# Patient Record
Sex: Female | Born: 1935 | Race: White | Hispanic: No | Smoking: Former smoker
Health system: Southern US, Community
[De-identification: ages and names within clinical notes are randomized; demographics above are authoritative.]

## PROBLEM LIST (undated history)

## (undated) DIAGNOSIS — L259 Unspecified contact dermatitis, unspecified cause: Secondary | ICD-10-CM

## (undated) DIAGNOSIS — E785 Hyperlipidemia, unspecified: Secondary | ICD-10-CM

## (undated) DIAGNOSIS — R001 Bradycardia, unspecified: Secondary | ICD-10-CM

## (undated) DIAGNOSIS — D689 Coagulation defect, unspecified: Secondary | ICD-10-CM

## (undated) DIAGNOSIS — K5730 Diverticulosis of large intestine without perforation or abscess without bleeding: Secondary | ICD-10-CM

## (undated) DIAGNOSIS — M545 Low back pain, unspecified: Secondary | ICD-10-CM

## (undated) DIAGNOSIS — Z8673 Personal history of transient ischemic attack (TIA), and cerebral infarction without residual deficits: Secondary | ICD-10-CM

## (undated) DIAGNOSIS — E039 Hypothyroidism, unspecified: Secondary | ICD-10-CM

## (undated) DIAGNOSIS — L509 Urticaria, unspecified: Secondary | ICD-10-CM

## (undated) DIAGNOSIS — Z85820 Personal history of malignant melanoma of skin: Secondary | ICD-10-CM

## (undated) DIAGNOSIS — M1990 Unspecified osteoarthritis, unspecified site: Secondary | ICD-10-CM

## (undated) DIAGNOSIS — N390 Urinary tract infection, site not specified: Secondary | ICD-10-CM

## (undated) DIAGNOSIS — I350 Nonrheumatic aortic (valve) stenosis: Secondary | ICD-10-CM

## (undated) DIAGNOSIS — I6529 Occlusion and stenosis of unspecified carotid artery: Secondary | ICD-10-CM

## (undated) DIAGNOSIS — C4490 Unspecified malignant neoplasm of skin, unspecified: Secondary | ICD-10-CM

## (undated) DIAGNOSIS — I482 Chronic atrial fibrillation, unspecified: Secondary | ICD-10-CM

## (undated) DIAGNOSIS — I10 Essential (primary) hypertension: Secondary | ICD-10-CM

## (undated) DIAGNOSIS — I5032 Chronic diastolic (congestive) heart failure: Secondary | ICD-10-CM

## (undated) DIAGNOSIS — M109 Gout, unspecified: Secondary | ICD-10-CM

## (undated) HISTORY — DX: Diverticulosis of large intestine without perforation or abscess without bleeding: K57.30

## (undated) HISTORY — DX: Hyperlipidemia, unspecified: E78.5

## (undated) HISTORY — DX: Low back pain, unspecified: M54.50

## (undated) HISTORY — DX: Unspecified malignant neoplasm of skin, unspecified: C44.90

## (undated) HISTORY — PX: TONSILLECTOMY: SUR1361

## (undated) HISTORY — DX: Urticaria, unspecified: L50.9

## (undated) HISTORY — DX: Personal history of transient ischemic attack (TIA), and cerebral infarction without residual deficits: Z86.73

## (undated) HISTORY — DX: Essential (primary) hypertension: I10

## (undated) HISTORY — DX: Nonrheumatic aortic (valve) stenosis: I35.0

## (undated) HISTORY — DX: Unspecified osteoarthritis, unspecified site: M19.90

## (undated) HISTORY — DX: Gout, unspecified: M10.9

## (undated) HISTORY — DX: Unspecified contact dermatitis, unspecified cause: L25.9

## (undated) HISTORY — DX: Chronic atrial fibrillation, unspecified: I48.20

## (undated) HISTORY — DX: Low back pain: M54.5

## (undated) HISTORY — DX: Urinary tract infection, site not specified: N39.0

## (undated) HISTORY — DX: Hypothyroidism, unspecified: E03.9

## (undated) HISTORY — DX: Personal history of malignant melanoma of skin: Z85.820

## (undated) HISTORY — PX: BAND HEMORRHOIDECTOMY: SHX1213

## (undated) HISTORY — DX: Coagulation defect, unspecified: D68.9

## (undated) HISTORY — DX: Chronic diastolic (congestive) heart failure: I50.32

## (undated) HISTORY — DX: Bradycardia, unspecified: R00.1

## (undated) HISTORY — PX: ABDOMINAL HYSTERECTOMY: SHX81

## (undated) HISTORY — DX: Occlusion and stenosis of unspecified carotid artery: I65.29

---

## 1999-10-06 HISTORY — PX: SKIN CANCER EXCISION: SHX779

## 2003-10-11 ENCOUNTER — Encounter: Admission: RE | Admit: 2003-10-11 | Discharge: 2003-10-11 | Payer: Self-pay | Admitting: Family Medicine

## 2004-10-31 ENCOUNTER — Ambulatory Visit: Payer: Self-pay | Admitting: Family Medicine

## 2004-11-11 ENCOUNTER — Ambulatory Visit: Payer: Self-pay | Admitting: Gastroenterology

## 2004-11-17 ENCOUNTER — Ambulatory Visit: Payer: Self-pay | Admitting: Gastroenterology

## 2004-12-10 ENCOUNTER — Ambulatory Visit: Payer: Self-pay | Admitting: Gastroenterology

## 2005-02-12 ENCOUNTER — Ambulatory Visit: Payer: Self-pay | Admitting: Family Medicine

## 2005-04-09 ENCOUNTER — Ambulatory Visit: Payer: Self-pay | Admitting: Gastroenterology

## 2005-04-14 ENCOUNTER — Ambulatory Visit: Payer: Self-pay | Admitting: Gastroenterology

## 2005-04-14 ENCOUNTER — Ambulatory Visit (HOSPITAL_COMMUNITY): Admission: RE | Admit: 2005-04-14 | Discharge: 2005-04-14 | Payer: Self-pay | Admitting: Gastroenterology

## 2005-05-20 ENCOUNTER — Ambulatory Visit: Payer: Self-pay | Admitting: Family Medicine

## 2005-05-25 ENCOUNTER — Ambulatory Visit: Payer: Self-pay | Admitting: Gastroenterology

## 2005-05-29 ENCOUNTER — Ambulatory Visit: Payer: Self-pay | Admitting: Family Medicine

## 2005-10-27 ENCOUNTER — Ambulatory Visit: Payer: Self-pay | Admitting: Family Medicine

## 2005-11-06 ENCOUNTER — Ambulatory Visit: Payer: Self-pay | Admitting: Internal Medicine

## 2005-11-10 ENCOUNTER — Ambulatory Visit: Payer: Self-pay | Admitting: Family Medicine

## 2005-11-25 ENCOUNTER — Ambulatory Visit: Payer: Self-pay | Admitting: Family Medicine

## 2006-07-29 ENCOUNTER — Ambulatory Visit: Payer: Self-pay | Admitting: Internal Medicine

## 2006-10-01 ENCOUNTER — Ambulatory Visit: Payer: Self-pay | Admitting: Family Medicine

## 2006-10-05 DIAGNOSIS — Z8673 Personal history of transient ischemic attack (TIA), and cerebral infarction without residual deficits: Secondary | ICD-10-CM

## 2006-10-05 HISTORY — DX: Personal history of transient ischemic attack (TIA), and cerebral infarction without residual deficits: Z86.73

## 2006-11-29 ENCOUNTER — Ambulatory Visit: Payer: Self-pay | Admitting: Gastroenterology

## 2006-12-21 ENCOUNTER — Ambulatory Visit (HOSPITAL_COMMUNITY): Admission: RE | Admit: 2006-12-21 | Discharge: 2006-12-21 | Payer: Self-pay | Admitting: Gastroenterology

## 2007-01-03 ENCOUNTER — Ambulatory Visit: Payer: Self-pay | Admitting: Family Medicine

## 2007-01-03 LAB — CONVERTED CEMR LAB
ALT: 16 units/L (ref 0–40)
AST: 23 units/L (ref 0–37)
Albumin: 3.9 g/dL (ref 3.5–5.2)
Alkaline Phosphatase: 94 units/L (ref 39–117)
BUN: 13 mg/dL (ref 6–23)
Basophils Absolute: 0 10*3/uL (ref 0.0–0.1)
Basophils Relative: 0.6 % (ref 0.0–1.0)
Bilirubin, Direct: 0.1 mg/dL (ref 0.0–0.3)
CO2: 31 meq/L (ref 19–32)
Calcium: 9.9 mg/dL (ref 8.4–10.5)
Chloride: 105 meq/L (ref 96–112)
Cholesterol: 153 mg/dL (ref 0–200)
Creatinine, Ser: 0.9 mg/dL (ref 0.4–1.2)
Eosinophils Absolute: 0.2 10*3/uL (ref 0.0–0.6)
Eosinophils Relative: 2.7 % (ref 0.0–5.0)
GFR calc Af Amer: 78 mL/min
GFR calc non Af Amer: 64 mL/min
Glucose, Bld: 90 mg/dL (ref 70–99)
HCT: 42.3 % (ref 36.0–46.0)
HDL: 49.1 mg/dL (ref >39.0)
Hemoglobin: 14.1 g/dL (ref 12.0–15.0)
LDL Cholesterol: 82 mg/dL (ref 0–99)
Lymphocytes Relative: 27.9 % (ref 12.0–46.0)
MCHC: 33.4 g/dL (ref 30.0–36.0)
MCV: 98.6 fL (ref 78.0–100.0)
Monocytes Absolute: 0.8 10*3/uL — ABNORMAL HIGH (ref 0.2–0.7)
Monocytes Relative: 10 % (ref 3.0–11.0)
Neutro Abs: 4.6 10*3/uL (ref 1.4–7.7)
Neutrophils Relative %: 58.8 % (ref 43.0–77.0)
Platelets: 286 10*3/uL (ref 150–400)
Potassium: 4.1 meq/L (ref 3.5–5.1)
RBC: 4.29 M/uL (ref 3.87–5.11)
RDW: 14.2 % (ref 11.5–14.6)
Sodium: 142 meq/L (ref 135–145)
TSH: 3.4 microintl units/mL (ref 0.35–5.50)
Total Bilirubin: 0.8 mg/dL (ref 0.3–1.2)
Total CHOL/HDL Ratio: 3.1
Total Protein: 7.4 g/dL (ref 6.0–8.3)
Triglycerides: 109 mg/dL (ref 0–149)
VLDL: 22 mg/dL (ref 0–40)
WBC: 7.8 10*3/uL (ref 4.5–10.5)

## 2007-01-20 ENCOUNTER — Ambulatory Visit: Payer: Self-pay | Admitting: Gastroenterology

## 2007-03-27 ENCOUNTER — Inpatient Hospital Stay (HOSPITAL_COMMUNITY): Admission: EM | Admit: 2007-03-27 | Discharge: 2007-03-30 | Payer: Self-pay | Admitting: Emergency Medicine

## 2007-03-28 ENCOUNTER — Encounter (INDEPENDENT_AMBULATORY_CARE_PROVIDER_SITE_OTHER): Payer: Self-pay | Admitting: *Deleted

## 2007-03-28 ENCOUNTER — Ambulatory Visit: Payer: Self-pay | Admitting: Cardiology

## 2007-03-28 ENCOUNTER — Encounter (INDEPENDENT_AMBULATORY_CARE_PROVIDER_SITE_OTHER): Payer: Self-pay | Admitting: Pediatrics

## 2007-04-11 ENCOUNTER — Ambulatory Visit: Payer: Self-pay | Admitting: Family Medicine

## 2007-04-22 ENCOUNTER — Ambulatory Visit: Payer: Self-pay | Admitting: Cardiology

## 2007-04-27 ENCOUNTER — Ambulatory Visit: Payer: Self-pay | Admitting: Cardiology

## 2007-05-03 ENCOUNTER — Ambulatory Visit: Payer: Self-pay | Admitting: Cardiology

## 2007-05-04 ENCOUNTER — Ambulatory Visit: Payer: Self-pay

## 2007-05-04 ENCOUNTER — Ambulatory Visit: Payer: Self-pay | Admitting: Cardiology

## 2007-05-06 ENCOUNTER — Ambulatory Visit: Payer: Self-pay | Admitting: Internal Medicine

## 2007-05-06 LAB — CONVERTED CEMR LAB
INR: 1.2 (ref 0.9–2.0)
Prothrombin Time: 13.5 s (ref 10.0–14.0)

## 2007-05-08 ENCOUNTER — Emergency Department (HOSPITAL_COMMUNITY): Admission: EM | Admit: 2007-05-08 | Discharge: 2007-05-08 | Payer: Self-pay | Admitting: Emergency Medicine

## 2007-05-09 ENCOUNTER — Ambulatory Visit: Payer: Self-pay

## 2007-05-09 LAB — CONVERTED CEMR LAB
Basophils Absolute: 0 10*3/uL (ref 0.0–0.1)
Basophils Relative: 0.5 % (ref 0.0–1.0)
Eosinophils Absolute: 0.1 10*3/uL (ref 0.0–0.6)
Eosinophils Relative: 1.5 % (ref 0.0–5.0)
HCT: 29.6 % — ABNORMAL LOW (ref 36.0–46.0)
Hemoglobin: 10 g/dL — ABNORMAL LOW (ref 12.0–15.0)
INR: 2 (ref 0.9–2.0)
Lymphocytes Relative: 40 % (ref 12.0–46.0)
MCHC: 33.8 g/dL (ref 30.0–36.0)
MCV: 100.2 fL — ABNORMAL HIGH (ref 78.0–100.0)
Monocytes Absolute: 0.7 10*3/uL (ref 0.2–0.7)
Monocytes Relative: 11.1 % — ABNORMAL HIGH (ref 3.0–11.0)
Neutro Abs: 3 10*3/uL (ref 1.4–7.7)
Neutrophils Relative %: 46.9 % (ref 43.0–77.0)
Platelets: 241 10*3/uL (ref 150–400)
Prothrombin Time: 17.6 s — ABNORMAL HIGH (ref 10.0–14.0)
RBC: 2.95 M/uL — ABNORMAL LOW (ref 3.87–5.11)
RDW: 16 % — ABNORMAL HIGH (ref 11.5–14.6)
WBC: 6.4 10*3/uL (ref 4.5–10.5)

## 2007-05-11 ENCOUNTER — Ambulatory Visit: Payer: Self-pay | Admitting: Cardiology

## 2007-05-16 ENCOUNTER — Ambulatory Visit: Payer: Self-pay | Admitting: Cardiology

## 2007-05-18 ENCOUNTER — Ambulatory Visit: Payer: Self-pay | Admitting: Internal Medicine

## 2007-05-23 ENCOUNTER — Ambulatory Visit: Payer: Self-pay | Admitting: Cardiology

## 2007-06-08 ENCOUNTER — Ambulatory Visit: Payer: Self-pay | Admitting: Internal Medicine

## 2007-06-10 ENCOUNTER — Emergency Department (HOSPITAL_COMMUNITY): Admission: EM | Admit: 2007-06-10 | Discharge: 2007-06-10 | Payer: Self-pay | Admitting: Emergency Medicine

## 2007-06-16 ENCOUNTER — Ambulatory Visit: Payer: Self-pay | Admitting: Cardiology

## 2007-06-16 LAB — CONVERTED CEMR LAB: TSH: 1.84 microintl units/mL (ref 0.35–5.50)

## 2007-07-07 ENCOUNTER — Ambulatory Visit: Payer: Self-pay | Admitting: Cardiology

## 2007-07-15 ENCOUNTER — Ambulatory Visit: Payer: Self-pay | Admitting: Family Medicine

## 2007-07-28 ENCOUNTER — Ambulatory Visit: Payer: Self-pay | Admitting: Cardiology

## 2007-08-01 ENCOUNTER — Ambulatory Visit: Payer: Self-pay | Admitting: Cardiology

## 2007-08-01 LAB — CONVERTED CEMR LAB
ALT: 19 units/L (ref 0–35)
AST: 29 units/L (ref 0–37)
Albumin: 3.7 g/dL (ref 3.5–5.2)
Alkaline Phosphatase: 72 units/L (ref 39–117)
Bilirubin, Direct: 0.1 mg/dL (ref 0.0–0.3)
Cholesterol: 136 mg/dL (ref 0–200)
HDL: 41 mg/dL (ref >39.0)
LDL Cholesterol: 75 mg/dL (ref 0–99)
Total Bilirubin: 0.8 mg/dL (ref 0.3–1.2)
Total CHOL/HDL Ratio: 3.3
Total Protein: 7.3 g/dL (ref 6.0–8.3)
Triglycerides: 102 mg/dL (ref 0–149)
VLDL: 20 mg/dL (ref 0–40)

## 2007-08-09 ENCOUNTER — Encounter: Payer: Self-pay | Admitting: Family Medicine

## 2007-08-25 ENCOUNTER — Ambulatory Visit: Payer: Self-pay | Admitting: Cardiology

## 2007-09-22 ENCOUNTER — Ambulatory Visit: Payer: Self-pay | Admitting: Cardiology

## 2007-10-03 ENCOUNTER — Telehealth: Payer: Self-pay | Admitting: Family Medicine

## 2007-10-11 ENCOUNTER — Ambulatory Visit: Payer: Self-pay | Admitting: Cardiology

## 2007-10-18 ENCOUNTER — Ambulatory Visit: Payer: Self-pay | Admitting: Family Medicine

## 2007-10-18 DIAGNOSIS — M1990 Unspecified osteoarthritis, unspecified site: Secondary | ICD-10-CM

## 2007-10-18 DIAGNOSIS — I4821 Permanent atrial fibrillation: Secondary | ICD-10-CM

## 2007-10-18 DIAGNOSIS — M109 Gout, unspecified: Secondary | ICD-10-CM

## 2007-10-18 DIAGNOSIS — E039 Hypothyroidism, unspecified: Secondary | ICD-10-CM | POA: Insufficient documentation

## 2007-10-18 DIAGNOSIS — I10 Essential (primary) hypertension: Secondary | ICD-10-CM

## 2007-10-18 DIAGNOSIS — K5730 Diverticulosis of large intestine without perforation or abscess without bleeding: Secondary | ICD-10-CM | POA: Insufficient documentation

## 2007-10-18 DIAGNOSIS — Z8679 Personal history of other diseases of the circulatory system: Secondary | ICD-10-CM | POA: Insufficient documentation

## 2007-10-18 DIAGNOSIS — E785 Hyperlipidemia, unspecified: Secondary | ICD-10-CM

## 2007-11-01 ENCOUNTER — Ambulatory Visit: Payer: Self-pay | Admitting: Cardiovascular Disease

## 2007-11-22 ENCOUNTER — Ambulatory Visit: Payer: Self-pay | Admitting: Cardiology

## 2007-12-01 ENCOUNTER — Encounter: Payer: Self-pay | Admitting: Family Medicine

## 2007-12-01 ENCOUNTER — Ambulatory Visit: Payer: Self-pay | Admitting: Internal Medicine

## 2007-12-06 ENCOUNTER — Ambulatory Visit: Payer: Self-pay | Admitting: Internal Medicine

## 2007-12-20 ENCOUNTER — Ambulatory Visit: Payer: Self-pay | Admitting: Cardiology

## 2007-12-30 ENCOUNTER — Telehealth: Payer: Self-pay | Admitting: Family Medicine

## 2008-01-09 ENCOUNTER — Ambulatory Visit: Payer: Self-pay | Admitting: Family Medicine

## 2008-01-09 DIAGNOSIS — M79609 Pain in unspecified limb: Secondary | ICD-10-CM

## 2008-01-10 ENCOUNTER — Ambulatory Visit: Payer: Self-pay | Admitting: Cardiology

## 2008-01-12 ENCOUNTER — Telehealth: Payer: Self-pay | Admitting: Family Medicine

## 2008-01-13 ENCOUNTER — Encounter: Admission: RE | Admit: 2008-01-13 | Discharge: 2008-01-13 | Payer: Self-pay | Admitting: Family Medicine

## 2008-01-16 ENCOUNTER — Telehealth: Payer: Self-pay | Admitting: Family Medicine

## 2008-01-16 ENCOUNTER — Ambulatory Visit: Payer: Self-pay | Admitting: Internal Medicine

## 2008-01-16 DIAGNOSIS — IMO0002 Reserved for concepts with insufficient information to code with codable children: Secondary | ICD-10-CM | POA: Insufficient documentation

## 2008-01-16 DIAGNOSIS — R21 Rash and other nonspecific skin eruption: Secondary | ICD-10-CM

## 2008-01-17 ENCOUNTER — Encounter: Payer: Self-pay | Admitting: Internal Medicine

## 2008-01-20 ENCOUNTER — Ambulatory Visit: Payer: Self-pay | Admitting: Family Medicine

## 2008-01-24 ENCOUNTER — Telehealth: Payer: Self-pay | Admitting: Family Medicine

## 2008-01-30 ENCOUNTER — Ambulatory Visit: Payer: Self-pay | Admitting: Family Medicine

## 2008-01-30 ENCOUNTER — Ambulatory Visit: Payer: Self-pay

## 2008-01-30 DIAGNOSIS — R609 Edema, unspecified: Secondary | ICD-10-CM

## 2008-01-31 DIAGNOSIS — M545 Low back pain: Secondary | ICD-10-CM

## 2008-02-02 ENCOUNTER — Encounter: Payer: Self-pay | Admitting: Family Medicine

## 2008-02-07 ENCOUNTER — Ambulatory Visit: Payer: Self-pay | Admitting: Cardiology

## 2008-02-13 ENCOUNTER — Encounter: Payer: Self-pay | Admitting: Family Medicine

## 2008-02-17 ENCOUNTER — Ambulatory Visit: Payer: Self-pay | Admitting: Cardiology

## 2008-02-20 ENCOUNTER — Ambulatory Visit: Payer: Self-pay | Admitting: Family Medicine

## 2008-02-20 LAB — CONVERTED CEMR LAB
Bilirubin Urine: NEGATIVE
Blood in Urine, dipstick: NEGATIVE
Glucose, Urine, Semiquant: NEGATIVE
Ketones, urine, test strip: NEGATIVE
Nitrite: NEGATIVE
Protein, U semiquant: NEGATIVE
Specific Gravity, Urine: <1.005
Urobilinogen, UA: 0.2
pH: 7

## 2008-02-21 ENCOUNTER — Ambulatory Visit: Payer: Self-pay | Admitting: Cardiology

## 2008-02-22 ENCOUNTER — Telehealth: Payer: Self-pay | Admitting: Family Medicine

## 2008-02-22 LAB — CONVERTED CEMR LAB
ALT: 25 units/L (ref 0–35)
AST: 38 units/L — ABNORMAL HIGH (ref 0–37)
Albumin: 3.7 g/dL (ref 3.5–5.2)
Alkaline Phosphatase: 72 units/L (ref 39–117)
BUN: 10 mg/dL (ref 6–23)
Basophils Absolute: 0.1 10*3/uL (ref 0.0–0.1)
Basophils Relative: 1.2 % — ABNORMAL HIGH (ref 0.0–1.0)
Bilirubin, Direct: 0.1 mg/dL (ref 0.0–0.3)
CO2: 28 meq/L (ref 19–32)
Calcium: 9.8 mg/dL (ref 8.4–10.5)
Chloride: 103 meq/L (ref 96–112)
Creatinine, Ser: 0.9 mg/dL (ref 0.4–1.2)
Eosinophils Absolute: 0.1 10*3/uL (ref 0.0–0.7)
Eosinophils Relative: 1.8 % (ref 0.0–5.0)
GFR calc Af Amer: 78 mL/min
GFR calc non Af Amer: 64 mL/min
Glucose, Bld: 69 mg/dL — ABNORMAL LOW (ref 70–99)
HCT: 36.3 % (ref 36.0–46.0)
Hemoglobin: 12 g/dL (ref 12.0–15.0)
Lymphocytes Relative: 32 % (ref 12.0–46.0)
MCHC: 33.1 g/dL (ref 30.0–36.0)
MCV: 94.8 fL (ref 78.0–100.0)
Monocytes Absolute: 0.5 10*3/uL (ref 0.1–1.0)
Monocytes Relative: 8.1 % (ref 3.0–12.0)
Neutro Abs: 3.9 10*3/uL (ref 1.4–7.7)
Neutrophils Relative %: 56.9 % (ref 43.0–77.0)
Platelets: 299 10*3/uL (ref 150–400)
Potassium: 3.7 meq/L (ref 3.5–5.1)
RBC: 3.83 M/uL — ABNORMAL LOW (ref 3.87–5.11)
RDW: 16.7 % — ABNORMAL HIGH (ref 11.5–14.6)
Sodium: 137 meq/L (ref 135–145)
TSH: 1.63 microintl units/mL (ref 0.35–5.50)
Total Bilirubin: 1.1 mg/dL (ref 0.3–1.2)
Total Protein: 7.4 g/dL (ref 6.0–8.3)
WBC: 6.7 10*3/uL (ref 4.5–10.5)

## 2008-03-01 ENCOUNTER — Ambulatory Visit: Payer: Self-pay | Admitting: Family Medicine

## 2008-03-01 DIAGNOSIS — L509 Urticaria, unspecified: Secondary | ICD-10-CM

## 2008-03-05 ENCOUNTER — Ambulatory Visit: Payer: Self-pay | Admitting: Cardiovascular Disease

## 2008-03-08 ENCOUNTER — Telehealth: Payer: Self-pay | Admitting: Family Medicine

## 2008-03-19 ENCOUNTER — Ambulatory Visit: Payer: Self-pay | Admitting: Internal Medicine

## 2008-04-02 ENCOUNTER — Ambulatory Visit: Payer: Self-pay | Admitting: Cardiology

## 2008-04-26 ENCOUNTER — Ambulatory Visit: Payer: Self-pay | Admitting: Internal Medicine

## 2008-04-26 ENCOUNTER — Ambulatory Visit: Payer: Self-pay | Admitting: Cardiology

## 2008-05-24 ENCOUNTER — Ambulatory Visit: Payer: Self-pay | Admitting: Internal Medicine

## 2008-05-28 ENCOUNTER — Telehealth: Payer: Self-pay | Admitting: Family Medicine

## 2008-06-01 ENCOUNTER — Ambulatory Visit: Payer: Self-pay | Admitting: Cardiology

## 2008-06-13 ENCOUNTER — Ambulatory Visit: Payer: Self-pay | Admitting: Family Medicine

## 2008-06-13 DIAGNOSIS — N3000 Acute cystitis without hematuria: Secondary | ICD-10-CM | POA: Insufficient documentation

## 2008-06-13 LAB — CONVERTED CEMR LAB
Bilirubin Urine: NEGATIVE
Blood in Urine, dipstick: NEGATIVE
Glucose, Urine, Semiquant: NEGATIVE
Ketones, urine, test strip: NEGATIVE
Nitrite: NEGATIVE
Protein, U semiquant: NEGATIVE
Specific Gravity, Urine: 1.02
Urobilinogen, UA: 0.2
pH: 7

## 2008-06-20 ENCOUNTER — Ambulatory Visit: Payer: Self-pay | Admitting: Cardiovascular Disease

## 2008-07-17 ENCOUNTER — Ambulatory Visit: Payer: Self-pay | Admitting: Internal Medicine

## 2008-07-18 ENCOUNTER — Ambulatory Visit: Payer: Self-pay | Admitting: Family Medicine

## 2008-08-09 ENCOUNTER — Ambulatory Visit: Payer: Self-pay | Admitting: Cardiology

## 2008-08-11 ENCOUNTER — Encounter: Payer: Self-pay | Admitting: Family Medicine

## 2008-08-28 ENCOUNTER — Ambulatory Visit: Payer: Self-pay | Admitting: Family Medicine

## 2008-08-28 DIAGNOSIS — N390 Urinary tract infection, site not specified: Secondary | ICD-10-CM

## 2008-08-28 LAB — CONVERTED CEMR LAB
Bilirubin Urine: NEGATIVE
Glucose, Urine, Semiquant: NEGATIVE
Ketones, urine, test strip: NEGATIVE
Nitrite: NEGATIVE
Protein, U semiquant: NEGATIVE
Specific Gravity, Urine: 1.015
Urobilinogen, UA: 0.2
pH: 7

## 2008-08-29 ENCOUNTER — Ambulatory Visit: Payer: Self-pay | Admitting: Cardiovascular Disease

## 2008-08-29 ENCOUNTER — Encounter: Payer: Self-pay | Admitting: Family Medicine

## 2008-08-31 LAB — CONVERTED CEMR LAB
ALT: 17 units/L (ref 0–35)
AST: 32 units/L (ref 0–37)
Albumin: 3.2 g/dL — ABNORMAL LOW (ref 3.5–5.2)
Alkaline Phosphatase: 80 units/L (ref 39–117)
BUN: 9 mg/dL (ref 6–23)
Basophils Absolute: 0.1 10*3/uL (ref 0.0–0.1)
Basophils Relative: 0.9 % (ref 0.0–3.0)
Bilirubin, Direct: 0.1 mg/dL (ref 0.0–0.3)
CO2: 31 meq/L (ref 19–32)
Calcium: 9.7 mg/dL (ref 8.4–10.5)
Chloride: 104 meq/L (ref 96–112)
Creatinine, Ser: 0.8 mg/dL (ref 0.4–1.2)
Eosinophils Absolute: 0.3 10*3/uL (ref 0.0–0.7)
Eosinophils Relative: 3.7 % (ref 0.0–5.0)
GFR calc Af Amer: 89 mL/min
GFR calc non Af Amer: 73 mL/min
Glucose, Bld: 84 mg/dL (ref 70–99)
HCT: 34.4 % — ABNORMAL LOW (ref 36.0–46.0)
Hemoglobin: 11.8 g/dL — ABNORMAL LOW (ref 12.0–15.0)
Lymphocytes Relative: 31 % (ref 12.0–46.0)
MCHC: 34.4 g/dL (ref 30.0–36.0)
MCV: 97.2 fL (ref 78.0–100.0)
Monocytes Absolute: 0.6 10*3/uL (ref 0.1–1.0)
Monocytes Relative: 9.1 % (ref 3.0–12.0)
Neutro Abs: 3.7 10*3/uL (ref 1.4–7.7)
Neutrophils Relative %: 55.3 % (ref 43.0–77.0)
Platelets: 263 10*3/uL (ref 150–400)
Potassium: 3.5 meq/L (ref 3.5–5.1)
RBC: 3.54 M/uL — ABNORMAL LOW (ref 3.87–5.11)
RDW: 13.1 % (ref 11.5–14.6)
Sodium: 141 meq/L (ref 135–145)
TSH: 1.2 microintl units/mL (ref 0.35–5.50)
Total Bilirubin: 0.6 mg/dL (ref 0.3–1.2)
Total Protein: 7.3 g/dL (ref 6.0–8.3)
WBC: 6.8 10*3/uL (ref 4.5–10.5)

## 2008-09-04 ENCOUNTER — Telehealth: Payer: Self-pay | Admitting: Family Medicine

## 2008-09-07 ENCOUNTER — Ambulatory Visit: Payer: Self-pay | Admitting: Internal Medicine

## 2008-09-14 ENCOUNTER — Ambulatory Visit: Payer: Self-pay | Admitting: Family Medicine

## 2008-09-18 ENCOUNTER — Encounter: Payer: Self-pay | Admitting: Family Medicine

## 2008-09-21 ENCOUNTER — Ambulatory Visit: Payer: Self-pay | Admitting: Cardiovascular Disease

## 2008-10-02 ENCOUNTER — Ambulatory Visit: Payer: Self-pay | Admitting: Cardiovascular Disease

## 2008-10-09 ENCOUNTER — Ambulatory Visit: Payer: Self-pay | Admitting: Family Medicine

## 2008-10-16 ENCOUNTER — Ambulatory Visit: Payer: Self-pay | Admitting: Cardiovascular Disease

## 2008-10-29 ENCOUNTER — Ambulatory Visit: Payer: Self-pay | Admitting: Family Medicine

## 2008-10-29 DIAGNOSIS — L259 Unspecified contact dermatitis, unspecified cause: Secondary | ICD-10-CM

## 2008-10-29 DIAGNOSIS — J069 Acute upper respiratory infection, unspecified: Secondary | ICD-10-CM | POA: Insufficient documentation

## 2008-11-01 ENCOUNTER — Telehealth: Payer: Self-pay | Admitting: Family Medicine

## 2008-11-06 ENCOUNTER — Ambulatory Visit: Payer: Self-pay | Admitting: Cardiology

## 2008-11-13 ENCOUNTER — Ambulatory Visit: Payer: Self-pay | Admitting: Cardiology

## 2008-12-04 ENCOUNTER — Ambulatory Visit: Payer: Self-pay | Admitting: Internal Medicine

## 2009-01-01 ENCOUNTER — Ambulatory Visit: Payer: Self-pay | Admitting: Cardiology

## 2009-01-29 ENCOUNTER — Ambulatory Visit: Payer: Self-pay | Admitting: Cardiology

## 2009-02-12 ENCOUNTER — Ambulatory Visit: Payer: Self-pay | Admitting: Cardiology

## 2009-02-26 ENCOUNTER — Ambulatory Visit: Payer: Self-pay | Admitting: Cardiology

## 2009-03-05 ENCOUNTER — Telehealth: Payer: Self-pay | Admitting: Family Medicine

## 2009-03-05 ENCOUNTER — Encounter: Payer: Self-pay | Admitting: *Deleted

## 2009-03-19 ENCOUNTER — Encounter (INDEPENDENT_AMBULATORY_CARE_PROVIDER_SITE_OTHER): Payer: Self-pay | Admitting: Cardiology

## 2009-03-19 ENCOUNTER — Ambulatory Visit: Payer: Self-pay | Admitting: Internal Medicine

## 2009-03-19 LAB — CONVERTED CEMR LAB
POC INR: 2.3
Protime: 18.4

## 2009-03-22 ENCOUNTER — Ambulatory Visit: Payer: Self-pay | Admitting: Family Medicine

## 2009-04-10 ENCOUNTER — Encounter: Payer: Self-pay | Admitting: *Deleted

## 2009-04-16 ENCOUNTER — Ambulatory Visit: Payer: Self-pay | Admitting: Cardiology

## 2009-04-16 LAB — CONVERTED CEMR LAB
POC INR: 2.1
Prothrombin Time: 18.1 s

## 2009-04-25 ENCOUNTER — Encounter (INDEPENDENT_AMBULATORY_CARE_PROVIDER_SITE_OTHER): Payer: Self-pay | Admitting: *Deleted

## 2009-05-14 ENCOUNTER — Ambulatory Visit: Payer: Self-pay | Admitting: Internal Medicine

## 2009-05-14 LAB — CONVERTED CEMR LAB
POC INR: 2.1
Prothrombin Time: 17.9 s

## 2009-05-21 ENCOUNTER — Ambulatory Visit: Payer: Self-pay | Admitting: Family Medicine

## 2009-05-21 ENCOUNTER — Encounter: Payer: Self-pay | Admitting: Cardiology

## 2009-05-28 ENCOUNTER — Ambulatory Visit: Payer: Self-pay | Admitting: Cardiology

## 2009-05-28 DIAGNOSIS — I498 Other specified cardiac arrhythmias: Secondary | ICD-10-CM

## 2009-05-28 DIAGNOSIS — I6340 Cerebral infarction due to embolism of unspecified cerebral artery: Secondary | ICD-10-CM

## 2009-05-31 ENCOUNTER — Ambulatory Visit: Payer: Self-pay | Admitting: Cardiology

## 2009-05-31 ENCOUNTER — Encounter: Payer: Self-pay | Admitting: Cardiovascular Disease

## 2009-06-11 ENCOUNTER — Ambulatory Visit: Payer: Self-pay | Admitting: Cardiology

## 2009-06-11 LAB — CONVERTED CEMR LAB: POC INR: 3.1

## 2009-07-01 ENCOUNTER — Telehealth: Payer: Self-pay | Admitting: Cardiology

## 2009-07-08 ENCOUNTER — Ambulatory Visit: Payer: Self-pay | Admitting: Internal Medicine

## 2009-07-08 ENCOUNTER — Ambulatory Visit: Payer: Self-pay | Admitting: Cardiology

## 2009-07-08 LAB — CONVERTED CEMR LAB: POC INR: 2.9

## 2009-07-11 ENCOUNTER — Ambulatory Visit: Payer: Self-pay | Admitting: Family Medicine

## 2009-07-16 LAB — CONVERTED CEMR LAB
BUN: 14 mg/dL (ref 6–23)
CO2: 32 meq/L (ref 19–32)
Calcium: 9.7 mg/dL (ref 8.4–10.5)
Chloride: 101 meq/L (ref 96–112)
Creatinine, Ser: 0.8 mg/dL (ref 0.4–1.2)
GFR calc non Af Amer: 73.14 mL/min (ref >60)
Glucose, Bld: 86 mg/dL (ref 70–99)
Potassium: 3.5 meq/L (ref 3.5–5.1)
Sodium: 136 meq/L (ref 135–145)

## 2009-08-06 ENCOUNTER — Ambulatory Visit: Payer: Self-pay | Admitting: Internal Medicine

## 2009-08-06 LAB — CONVERTED CEMR LAB: POC INR: 2.7

## 2009-08-16 ENCOUNTER — Encounter (INDEPENDENT_AMBULATORY_CARE_PROVIDER_SITE_OTHER): Payer: Self-pay | Admitting: *Deleted

## 2009-09-03 ENCOUNTER — Ambulatory Visit: Payer: Self-pay | Admitting: Cardiology

## 2009-09-03 LAB — CONVERTED CEMR LAB: POC INR: 2.6

## 2009-09-11 ENCOUNTER — Encounter (INDEPENDENT_AMBULATORY_CARE_PROVIDER_SITE_OTHER): Payer: Self-pay | Admitting: *Deleted

## 2009-10-01 ENCOUNTER — Ambulatory Visit: Payer: Self-pay | Admitting: Cardiology

## 2009-10-01 ENCOUNTER — Encounter (INDEPENDENT_AMBULATORY_CARE_PROVIDER_SITE_OTHER): Payer: Self-pay | Admitting: *Deleted

## 2009-10-01 LAB — CONVERTED CEMR LAB: POC INR: 3.2

## 2009-10-29 ENCOUNTER — Ambulatory Visit: Payer: Self-pay | Admitting: Cardiovascular Disease

## 2009-10-29 LAB — CONVERTED CEMR LAB: POC INR: 3.1

## 2009-11-07 ENCOUNTER — Ambulatory Visit: Payer: Self-pay | Admitting: Cardiology

## 2009-11-26 ENCOUNTER — Ambulatory Visit: Payer: Self-pay | Admitting: Cardiology

## 2009-11-26 LAB — CONVERTED CEMR LAB: POC INR: 2

## 2009-12-24 ENCOUNTER — Ambulatory Visit: Payer: Self-pay | Admitting: Cardiovascular Disease

## 2009-12-24 LAB — CONVERTED CEMR LAB: POC INR: 2.2

## 2010-01-17 ENCOUNTER — Encounter: Payer: Self-pay | Admitting: Family Medicine

## 2010-01-21 ENCOUNTER — Ambulatory Visit: Payer: Self-pay | Admitting: Cardiovascular Disease

## 2010-01-21 LAB — CONVERTED CEMR LAB: POC INR: 3.1

## 2010-01-30 ENCOUNTER — Encounter: Payer: Self-pay | Admitting: Family Medicine

## 2010-01-30 ENCOUNTER — Ambulatory Visit: Payer: Self-pay | Admitting: Internal Medicine

## 2010-02-13 ENCOUNTER — Telehealth: Payer: Self-pay | Admitting: Cardiology

## 2010-02-18 ENCOUNTER — Encounter: Payer: Self-pay | Admitting: Family Medicine

## 2010-02-18 ENCOUNTER — Ambulatory Visit: Payer: Self-pay | Admitting: Internal Medicine

## 2010-02-18 LAB — CONVERTED CEMR LAB: POC INR: 3.1

## 2010-02-19 ENCOUNTER — Telehealth: Payer: Self-pay | Admitting: Family Medicine

## 2010-02-21 ENCOUNTER — Telehealth: Payer: Self-pay | Admitting: Family Medicine

## 2010-03-04 ENCOUNTER — Ambulatory Visit: Payer: Self-pay | Admitting: Internal Medicine

## 2010-03-04 LAB — CONVERTED CEMR LAB: POC INR: 2.4

## 2010-03-25 ENCOUNTER — Ambulatory Visit: Payer: Self-pay | Admitting: Cardiology

## 2010-04-22 ENCOUNTER — Ambulatory Visit: Payer: Self-pay | Admitting: Internal Medicine

## 2010-05-06 ENCOUNTER — Ambulatory Visit: Payer: Self-pay | Admitting: Cardiology

## 2010-05-06 DIAGNOSIS — I6529 Occlusion and stenosis of unspecified carotid artery: Secondary | ICD-10-CM

## 2010-05-23 ENCOUNTER — Encounter: Payer: Self-pay | Admitting: Cardiology

## 2010-05-23 ENCOUNTER — Ambulatory Visit: Payer: Self-pay

## 2010-06-03 ENCOUNTER — Ambulatory Visit: Payer: Self-pay | Admitting: Cardiovascular Disease

## 2010-06-19 ENCOUNTER — Ambulatory Visit: Payer: Self-pay | Admitting: Family Medicine

## 2010-06-23 ENCOUNTER — Telehealth: Payer: Self-pay | Admitting: Family Medicine

## 2010-06-24 ENCOUNTER — Ambulatory Visit: Payer: Self-pay | Admitting: Cardiology

## 2010-06-27 ENCOUNTER — Ambulatory Visit: Payer: Self-pay | Admitting: Family Medicine

## 2010-07-17 ENCOUNTER — Ambulatory Visit: Payer: Self-pay | Admitting: Internal Medicine

## 2010-08-06 ENCOUNTER — Ambulatory Visit: Payer: Self-pay | Admitting: Family Medicine

## 2010-08-06 DIAGNOSIS — M810 Age-related osteoporosis without current pathological fracture: Secondary | ICD-10-CM | POA: Insufficient documentation

## 2010-08-08 LAB — CONVERTED CEMR LAB
Alkaline Phosphatase: 68 units/L (ref 39–117)
Basophils Relative: 0.9 % (ref 0.0–3.0)
Bilirubin, Direct: 0.1 mg/dL (ref 0.0–0.3)
Calcium: 9.3 mg/dL (ref 8.4–10.5)
Cholesterol: 213 mg/dL — ABNORMAL HIGH (ref 0–200)
Creatinine, Ser: 0.7 mg/dL (ref 0.4–1.2)
Eosinophils Absolute: 0.2 10*3/uL (ref 0.0–0.7)
Lymphocytes Relative: 27.8 % (ref 12.0–46.0)
MCHC: 33.8 g/dL (ref 30.0–36.0)
Neutrophils Relative %: 60.5 % (ref 43.0–77.0)
RBC: 3.73 M/uL — ABNORMAL LOW (ref 3.87–5.11)
Total Bilirubin: 1 mg/dL (ref 0.3–1.2)
Total CHOL/HDL Ratio: 5
Total Protein: 7.6 g/dL (ref 6.0–8.3)
Triglycerides: 145 mg/dL (ref 0.0–149.0)
VLDL: 29 mg/dL (ref 0.0–40.0)
Vit D, 25-Hydroxy: 46 ng/mL (ref 30–89)
WBC: 7.2 10*3/uL (ref 4.5–10.5)

## 2010-08-14 ENCOUNTER — Ambulatory Visit: Payer: Self-pay | Admitting: Cardiology

## 2010-08-14 LAB — CONVERTED CEMR LAB: POC INR: 2.1

## 2010-08-18 ENCOUNTER — Encounter: Payer: Self-pay | Admitting: Family Medicine

## 2010-08-21 ENCOUNTER — Encounter: Payer: Self-pay | Admitting: Family Medicine

## 2010-08-27 ENCOUNTER — Encounter: Payer: Self-pay | Admitting: Family Medicine

## 2010-09-11 ENCOUNTER — Ambulatory Visit: Payer: Self-pay | Admitting: Internal Medicine

## 2010-09-11 LAB — CONVERTED CEMR LAB: POC INR: 2.1

## 2010-10-09 ENCOUNTER — Ambulatory Visit: Admission: RE | Admit: 2010-10-09 | Discharge: 2010-10-09 | Payer: Self-pay | Source: Home / Self Care

## 2010-11-02 LAB — CONVERTED CEMR LAB
ALT: 11 units/L (ref 0–35)
AST: 21 units/L (ref 0–37)
Albumin: 3.5 g/dL (ref 3.5–5.2)
Alkaline Phosphatase: 75 units/L (ref 39–117)
BUN: 11 mg/dL (ref 6–23)
Basophils Absolute: 0 10*3/uL (ref 0.0–0.1)
Basophils Relative: 0.4 % (ref 0.0–3.0)
Bilirubin, Direct: 0.1 mg/dL (ref 0.0–0.3)
CO2: 29 meq/L (ref 19–32)
Calcium: 9.5 mg/dL (ref 8.4–10.5)
Chloride: 110 meq/L (ref 96–112)
Cholesterol: 193 mg/dL (ref 0–200)
Creatinine, Ser: 0.7 mg/dL (ref 0.4–1.2)
Eosinophils Absolute: 0.3 10*3/uL (ref 0.0–0.7)
Eosinophils Relative: 3.3 % (ref 0.0–5.0)
GFR calc non Af Amer: 85.35 mL/min (ref >60)
Glucose, Bld: 88 mg/dL (ref 70–99)
HCT: 34 % — ABNORMAL LOW (ref 36.0–46.0)
HDL: 42.5 mg/dL (ref >39.00)
Hemoglobin: 11.3 g/dL — ABNORMAL LOW (ref 12.0–15.0)
LDL Cholesterol: 132 mg/dL — ABNORMAL HIGH (ref 0–99)
Lymphocytes Relative: 22.1 % (ref 12.0–46.0)
Lymphs Abs: 1.8 10*3/uL (ref 0.7–4.0)
MCHC: 33.1 g/dL (ref 30.0–36.0)
MCV: 94.1 fL (ref 78.0–100.0)
Monocytes Absolute: 0.6 10*3/uL (ref 0.1–1.0)
Monocytes Relative: 7.6 % (ref 3.0–12.0)
Neutro Abs: 5.3 10*3/uL (ref 1.4–7.7)
Neutrophils Relative %: 66.6 % (ref 43.0–77.0)
Platelets: 311 10*3/uL (ref 150.0–400.0)
Potassium: 4.2 meq/L (ref 3.5–5.1)
RBC: 3.61 M/uL — ABNORMAL LOW (ref 3.87–5.11)
RDW: 15.8 % — ABNORMAL HIGH (ref 11.5–14.6)
Sodium: 144 meq/L (ref 135–145)
TSH: 1.58 microintl units/mL (ref 0.35–5.50)
Total Bilirubin: 0.6 mg/dL (ref 0.3–1.2)
Total CHOL/HDL Ratio: 5
Total Protein: 7.8 g/dL (ref 6.0–8.3)
Triglycerides: 93 mg/dL (ref 0.0–149.0)
VLDL: 18.6 mg/dL (ref 0.0–40.0)
WBC: 8 10*3/uL (ref 4.5–10.5)

## 2010-11-04 NOTE — Medication Information (Signed)
Summary: rov/tm  Anticoagulant Therapy  Managed by: Bethena Midget, RN, BSN Referring MD: Valera Castle MD PCP: Nelwyn Salisbury MD Supervising MD: Ladona Ridgel MD, Sharlot Gowda Indication 1: Atrial Fibrillation (ICD-427.31) Lab Used: [facility] Site: [facility] INR POC 2.4 INR RANGE 2 - 3  Dietary changes: yes       Details: increased greens in diet  Health status changes: yes       Details: Had a UTI been as per Dr. Vonita Moss was put on Ciprofloxacin 250mg  2 stat and 1 tablet twice daily last dose on 03/03/10 DR. Fry  prescribed Boniva 150mg  , take 1 tablet monthly. started first dose 02/21/19.  Bleeding/hemorrhagic complications: no    Recent/future hospitalizations: no    Any changes in medication regimen? yes       Details: see above was on ciprofloxacin for UTI.  Recent/future dental: no  Any missed doses?: no       Is patient compliant with meds? yes       Current Medications (verified): 1)  Losartan Potassium 100 Mg Tabs (Losartan Potassium) .[name].. 1 By Mouth Once Daily 2)  Allopurinol 300 Mg Tabs (Allopurinol) .... Once Daily 3)  Lasix 20 Mg Tabs (Furosemide) .[name].. 1 By Mouth Once Daily 4)  Multivitamins   Tabs (Multiple Vitamin) .[name].. 1 By Mouth Once Daily 5)  Calcium 600 1500 Mg  Tabs (Calcium Carbonate) .[name].. 1 By Mouth Once Daily 6)  Vitamin B-6 500 Mg  Tabs (Pyridoxine Hcl) .[name].. 1 By Mouth Once Daily 7)  Synthroid 50 Mcg  Tabs (Levothyroxine Sodium) .... Once Daily 8)  Percocet 5-325 Mg  Tabs (Oxycodone-Acetaminophen) .[name].. 1-2 Every 6 Hours As Needed Pain 9)  Warfarin Sodium 5 Mg Tabs (Warfarin Sodium) .... Use As Directed By Anticoagulation Clinic 10)  Vitamin D 1000 Unit Tabs (Cholecalciferol) .[name].. 1 Tab Once Daily 11)  Atenolol 25 Mg Tabs (Atenolol) .... Take One Tablet By Mouth Two Times A Day 12)  Amlodipine Besylate 5 Mg Tabs (Amlodipine Besylate) .... Take One Tablet By Mouth Daily 13)  Boniva 150 Mg Tabs (Ibandronate Sodium) .... Once A Month  Allergies: 1)  !  Amoxicillin 2)  ! Vicodin 3)  ! * Zelnorm 4)  ! Macrobid 5)  ! Sulfa  Anticoagulation Management History:      The patient is taking warfarin and comes in today for a routine follow up visit.  Positive risk factors for bleeding include an age of 51 years or older and history of CVA/TIA.  The bleeding index is 'intermediate risk'.  Positive CHADS2 values include History of HTN, Age > 32 years old, and Prior Stroke/CVA/TIA.  The start date was 05/09/2007.  Her last INR was 2.0 RATIO.  Anticoagulation responsible provider: Ladona Ridgel MD, Sharlot Gowda.  INR POC: 2.4.  Cuvette Lot#: [id number].  Exp: 05/2011.    Anticoagulation Management Assessment/Plan:      The patient's current anticoagulation dose is Warfarin sodium 5 mg tabs: Use as directed by Anticoagulation Clinic.  The target INR is 2 - 3.  The next INR is due 03/25/2010.  Anticoagulation instructions were given to patient.  Results were reviewed/authorized by Bethena Midget, RN, BSN.  She was notified by [facility].         Prior Anticoagulation Instructions: INR 3.1 Today skip dose then resume 5mg s daily except 2.5mg  on Mondays and Thursdays. Recheck in 2 weeks.   Current Anticoagulation Instructions: INR-2.4 Resume normal dosing schedule. Take half a tablet on Monday and Thursday and take  1 tablet on all other days. Return  in 3 weeks

## 2010-11-04 NOTE — Medication Information (Signed)
Summary: Step Therapy Request form  Step Therapy Request form   Imported By: Maryln Gottron 02/21/2010 13:54:03  _____________________________________________________________________  External Attachment:    Type:   Image     Comment:   External Document

## 2010-11-04 NOTE — Assessment & Plan Note (Signed)
Summary: MED CK / REFILL // RS   Vital Signs:  Patient profile:   77 year old female Weight:      115 pounds O2 Sat:      98 % Temp:     98.2 degrees F Pulse rate:   62 / minute BP sitting:   126 / 72  (left arm)  Vitals Entered By: Pura Spice, RN (June 27, 2010 11:29 AM) CC: refill oxycodone    History of Present Illness: here for a med refill. She is doing well in general. She stays active, and she has put on a little weight. She recently saw Dr. Daleen Squibb and got a good report from him. Her carotids have only minimal occlusions. She is set to have her cpx with me in several months.   Allergies: 1)  ! Amoxicillin 2)  ! Vicodin 3)  ! * Zelnorm 4)  ! Macrobid 5)  ! Sulfa  Past History:  Past Medical History: Reviewed history from 05/21/2009 and no changes required. ATRIAL FIBRILLATION (ICD-427.31), sees Dr. Juanito Doom and the [facility] HYPERTENSION (ICD-401.9) HYPERLIPIDEMIA (ICD-272.4) EDEMA LEG (ICD-782.3) ECZEMA (ICD-692.9) VIRAL URI (ICD-465.9) UTI'S, RECURRENT (ICD-599.0), sees Dr. Vonita Moss ACUTE CYSTITIS (ICD-595.0) URTICARIA (ICD-708.9) LOW BACK PAIN, CHRONIC (ICD-724.2) ? of UNS ADVRS EFF UNS RX MEDICINAL&BIOLOGICAL SBSTNC (ICD-995.20) RASH AND OTHER NONSPECIFIC SKIN ERUPTION (ICD-782.1) HIP THIGH LEG&ANK BLISTER WITHOUT MENTION INF (ICD-916.2) LEG PAIN, BILATERAL (ICD-729.5) GOUT (ICD-274.9) HYPOTHYROIDISM (ICD-244.9) CEREBROVASCULAR ACCIDENT, HX OF (ICD-V12.50) OSTEOARTHRITIS (ICD-715.90) DIVERTICULOSIS, COLON (ICD-562.10)  Past Surgical History: Reviewed history from 01/25/2009 and no changes required. Skin Cancer  removed rt ear 2001 per Dr. Jorja Loa Skin grafts harvested from rt cheek Hysterectomy Tonsillectomy Cataract extraction colonoscopy 09-16-05 per Dr. Arlyce Dice, repeat 5 yrs band ligation  Review of Systems  The patient denies weight loss, anorexia, fever, weight gain, vision loss, decreased hearing, hoarseness, chest pain, syncope,  dyspnea on exertion, peripheral edema, prolonged cough, headaches, hemoptysis, abdominal pain, melena, hematochezia, severe indigestion/heartburn, hematuria, incontinence, genital sores, muscle weakness, suspicious skin lesions, transient blindness, difficulty walking, depression, unusual weight change, abnormal bleeding, enlarged lymph nodes, angioedema, breast masses, and testicular masses.    Physical Exam  General:  Well-developed,well-nourished,in no acute distress; alert,appropriate and cooperative throughout examination Neck:  No deformities, masses, or tenderness noted. Lungs:  Normal respiratory effort, chest expands symmetrically. Lungs are clear to auscultation, no crackles or wheezes. Heart:  Normal rate and regular rhythm. S1 and S2 normal without gallop, murmur, click, rub or other extra sounds.   Impression & Recommendations:  Problem # 1:  ATRIAL FIBRILLATION (ICD-427.31)  Her updated medication list for this problem includes:    Warfarin Sodium 5 Mg Tabs (Warfarin sodium) ..... Use as directed by anticoagulation clinic    Atenolol 25 Mg Tabs (Atenolol) .[name]... Take one tablet by mouth two times a day    Amlodipine Besylate 5 Mg Tabs (Amlodipine besylate) .[name]... Take one tablet by mouth daily  Problem # 2:  HYPERTENSION (ICD-401.9)  Her updated medication list for this problem includes:    Losartan Potassium 100 Mg Tabs (Losartan potassium) .[name]... 1 by mouth once daily    Lasix 20 Mg Tabs (Furosemide) .[name]... 1 by mouth once daily    Atenolol 25 Mg Tabs (Atenolol) .[name]... Take one tablet by mouth two times a day    Amlodipine Besylate 5 Mg Tabs (Amlodipine besylate) .[name]... Take one tablet by mouth daily  Problem # 3:  LOW BACK PAIN, CHRONIC (ICD-724.2)  Her updated medication list for this  problem includes:    Percocet 5-325 Mg Tabs (Oxycodone-acetaminophen) .[name]... 1-2 every 6 hours as needed pain  Complete Medication List: 1)  Losartan Potassium 100 Mg Tabs (Losartan  potassium) .[name].. 1 by mouth once daily 2)  Allopurinol 300 Mg Tabs (Allopurinol) .... Once daily 3)  Lasix 20 Mg Tabs (Furosemide) .[name].. 1 by mouth once daily 4)  Multivitamins Tabs (Multiple vitamin) .[name].. 1 by mouth once daily 5)  Calcium Chews 1000mg   .... 1 chew two times a day 6)  Vitamin B-6 500 Mg Tabs (Pyridoxine hcl) .[name].. 1 by mouth once daily 7)  Synthroid 50 Mcg Tabs (Levothyroxine sodium) .... Once daily 8)  Percocet 5-325 Mg Tabs (Oxycodone-acetaminophen) .[name].. 1-2 every 6 hours as needed pain 9)  Warfarin Sodium 5 Mg Tabs (Warfarin sodium) .... Use as directed by anticoagulation clinic 10)  Vitamin D3 2000 Unit Caps (Cholecalciferol) .[name].. 1 cap once daily 11)  Atenolol 25 Mg Tabs (Atenolol) .... Take one tablet by mouth two times a day 12)  Amlodipine Besylate 5 Mg Tabs (Amlodipine besylate) .... Take one tablet by mouth daily 13)  Boniva 150 Mg Tabs (Ibandronate sodium) .... Once a month  Patient Instructions: 1)  refilled Percocet Prescriptions: PERCOCET 5-325 MG  TABS (OXYCODONE-ACETAMINOPHEN) 1-2 every 6 hours as needed pain  #240 x 0   Entered and Authorized by:   Nelwyn Salisbury MD   Signed by:   Nelwyn Salisbury MD on 06/27/2010   Method used:   Print then Give to Patient   RxID:   [id number]

## 2010-11-04 NOTE — Medication Information (Signed)
Summary: rov/ewj  Anticoagulant Therapy  Managed by: Weston Brass, PharmD Referring MD: Valera Castle MD PCP: Nelwyn Salisbury MD Supervising MD: Ladona Ridgel MD, Sharlot Gowda Indication 1: Atrial Fibrillation (ICD-427.31) Lab Used: [facility] Site: [facility] INR POC 2.1 INR RANGE 2 - 3  Dietary changes: no    Health status changes: no    Bleeding/hemorrhagic complications: no    Recent/future hospitalizations: no    Any changes in medication regimen? no    Recent/future dental: no  Any missed doses?: no       Is patient compliant with meds? yes       Allergies: 1)  ! Amoxicillin 2)  ! Vicodin 3)  ! * Zelnorm 4)  ! Macrobid 5)  ! Sulfa  Anticoagulation Management History:      The patient is taking warfarin and comes in today for a routine follow up visit.  Positive risk factors for bleeding include an age of 4 years or older and history of CVA/TIA.  The bleeding index is 'intermediate risk'.  Positive CHADS2 values include History of HTN, Age > 5 years old, and Prior Stroke/CVA/TIA.  The start date was 05/09/2007.  Her last INR was 2.0 RATIO.  Anticoagulation responsible provider: Ladona Ridgel MD, Sharlot Gowda.  INR POC: 2.1.  Cuvette Lot#: [id number].  Exp: 08/2011.    Anticoagulation Management Assessment/Plan:      The patient's current anticoagulation dose is Warfarin sodium 5 mg tabs: Use as directed by Anticoagulation Clinic.  The target INR is 2 - 3.  The next INR is due 08/14/2010.  Anticoagulation instructions were given to patient.  Results were reviewed/authorized by Weston Brass, PharmD.  She was notified by Ilean Skill D candidate.         Prior Anticoagulation Instructions: INR 2.2  Continue on same dosage 1 tablet daily except 1/2 tablet on Mondays.   Recheck in 4 weeks.    Current Anticoagulation Instructions: INR 2.1  Continue taking 1 tablet everyday except 1/2 tablet on Monday. Recheck in 4 weeks.

## 2010-11-04 NOTE — Medication Information (Signed)
Summary: [name]   Anticoagulant Therapy  Managed by: Bethena Midget, RN, BSN Referring MD: Valera Castle MD PCP: Nelwyn Salisbury MD Supervising MD: Graciela Husbands MD, Viviann Spare Indication 1: Atrial Fibrillation (ICD-427.31) Lab Used: [facility] Site: [facility] INR POC 2.1 INR RANGE 2 - 3  Dietary changes: no    Health status changes: no    Bleeding/hemorrhagic complications: no    Recent/future hospitalizations: no    Any changes in medication regimen? no    Recent/future dental: no  Any missed doses?: no       Is patient compliant with meds? yes       Allergies: 1)  ! Amoxicillin 2)  ! Vicodin 3)  ! * Zelnorm 4)  ! Macrobid 5)  ! Sulfa  Anticoagulation Management History:      The patient is taking warfarin and comes in today for a routine follow up visit.  Positive risk factors for bleeding include an age of 75 years or older and history of CVA/TIA.  The bleeding index is 'intermediate risk'.  Positive CHADS2 values include History of HTN, Age > 75 years old, and Prior Stroke/CVA/TIA.  The start date was 05/09/2007.  Her last INR was 2.0 RATIO.  Anticoagulation responsible Xoie Kreuser: Graciela Husbands MD, Viviann Spare.  INR POC: 2.1.  Cuvette Lot#: [id number].  Exp: 11/2011.    Anticoagulation Management Assessment/Plan:      The patient's current anticoagulation dose is Warfarin sodium 5 mg tabs: Use as directed by Anticoagulation Clinic.  The target INR is 2 - 3.  The next INR is due 10/09/2010.  Anticoagulation instructions were given to patient.  Results were reviewed/authorized by Bethena Midget, RN, BSN.  She was notified by Bethena Midget, RN, BSN.         Prior Anticoagulation Instructions: INR 2.1  Continue taking Coumadin 1 tab (5 mg) on all days except Coumadin 0.5 tab (2.5 mg) on Mondays. Return to clinic in 4 weeks.   Current Anticoagulation Instructions: INR 2.1 Continue 5mg s everyday except 2.5mg  on Mondays. Recheck in 4 weeks.

## 2010-11-04 NOTE — Medication Information (Signed)
Summary: rov/tm  Anticoagulant Therapy  Managed by: Bethena Midget, RN, BSN Referring MD: Valera Castle MD PCP: Nelwyn Salisbury MD Supervising MD: Graciela Husbands MD, Viviann Spare Indication 1: Atrial Fibrillation (ICD-427.31) Lab Used: [facility]  Site: [facility] INR POC 2.0 INR RANGE 2 - 3  Dietary changes: no    Health status changes: no    Bleeding/hemorrhagic complications: no    Recent/future hospitalizations: no    Any changes in medication regimen? no    Recent/future dental: no  Any missed doses?: no       Is patient compliant with meds? yes      Comments: Took Enablex for 28 days, completed this 7 days ago.   Allergies: 1)  ! Amoxicillin 2)  ! Vicodin 3)  ! * Zelnorm 4)  ! Macrobid 5)  ! Sulfa  Anticoagulation Management History:      The patient is taking warfarin and comes in today for a routine follow up visit.  Positive risk factors for bleeding include an age of 75 years or older and history of CVA/TIA.  The bleeding index is 'intermediate risk'.  Positive CHADS2 values include History of HTN, Age > 75 years old, and Prior Stroke/CVA/TIA.  The start date was 05/09/2007.  Her last INR was 2.0 RATIO.  Anticoagulation responsible provider: Graciela Husbands MD, Viviann Spare.  INR POC: 2.0.  Cuvette Lot#: [id number].  Exp: 07/2011.    Anticoagulation Management Assessment/Plan:      The patient's current anticoagulation dose is Warfarin sodium 5 mg tabs: Use as directed by Anticoagulation Clinic.  The target INR is 2 - 3.  The next INR is due 05/06/2010.  Anticoagulation instructions were given to patient.  Results were reviewed/authorized by Bethena Midget, RN, BSN.  She was notified by Bethena Midget, RN, BSN.         Prior Anticoagulation Instructions: INR 2.8 Continue 5mg s everyday except 2.5mg s on Mondays and Thursdays. Recheck in 4 weeks.   Current Anticoagulation Instructions: INR 2.0 Continue 5mg s everyday except 2.5mg s on Mondays and Thursdays. REcheck in 2 weeks with MD appt.

## 2010-11-04 NOTE — Medication Information (Signed)
Summary: rov/sp  Anticoagulant Therapy  Managed by: Cloyde Reams, RN, BSN Referring MD: Valera Castle MD PCP: Nelwyn Salisbury MD Supervising MD: Daleen Squibb MD, Maisie Fus Indication 1: Atrial Fibrillation (ICD-427.31) Lab Used: [facility] Site: [facility] INR POC 2.2 INR RANGE 2 - 3  Dietary changes: no    Health status changes: no    Bleeding/hemorrhagic complications: no    Recent/future hospitalizations: no    Any changes in medication regimen? no    Recent/future dental: no  Any missed doses?: no       Is patient compliant with meds? yes       Allergies: 1)  ! Amoxicillin 2)  ! Vicodin 3)  ! * Zelnorm 4)  ! Macrobid 5)  ! Sulfa  Anticoagulation Management History:      The patient is taking warfarin and comes in today for a routine follow up visit.  Positive risk factors for bleeding include an age of 75 years or older and history of CVA/TIA.  The bleeding index is 'intermediate risk'.  Positive CHADS2 values include History of HTN, Age > 57 years old, and Prior Stroke/CVA/TIA.  The start date was 05/09/2007.  Her last INR was 2.0 RATIO.  Anticoagulation responsible provider: Daleen Squibb MD, Maisie Fus.  INR POC: 2.2.  Cuvette Lot#: [id number].  Exp: 07/2011.    Anticoagulation Management Assessment/Plan:      The patient's current anticoagulation dose is Warfarin sodium 5 mg tabs: Use as directed by Anticoagulation Clinic.  The target INR is 2 - 3.  The next INR is due 07/22/2010.  Anticoagulation instructions were given to patient.  Results were reviewed/authorized by Cloyde Reams, RN, BSN.  She was notified by Cloyde Reams RN.         Prior Anticoagulation Instructions: INR 1.9  Begin taking 1 tablet daily except 1/2 tablet on Mondays.  Return to clinic in 3 weeks.  Current Anticoagulation Instructions: INR 2.2  Continue on same dosage 1 tablet daily except 1/2 tablet on Mondays.   Recheck in 4 weeks.

## 2010-11-04 NOTE — Assessment & Plan Note (Signed)
Summary: EMP/PT FASTING/PT SAYS DO NOT DO UA/CJR   Vital Signs:  Patient profile:   77 year old female Height:      60 inches Weight:      113 pounds O2 Sat:      98 % Temp:     97.9 degrees F Pulse rate:   63 / minute BP sitting:   140 / 80  (left arm) Cuff size:   regular  Vitals Entered By: Pura Spice, RN (August 06, 2010 8:46 AM) CC: cpx fasting    History of Present Illness: 77 yr old female for a cpx. She is doing fairly well. She remains active and tries to walk every day. She still has stiffness and pain in the spine, but does some daily stretches. No SOB or chest pain. She generally cannot tell when she goes into atrial fibrillation or not. She has some constipation problems but admits to not eating much fiber. She still drives short distances around town.   Allergies: 1)  ! Amoxicillin 2)  ! Vicodin 3)  ! * Zelnorm 4)  ! Macrobid 5)  ! Sulfa  Past History:  Past Medical History: ATRIAL FIBRILLATION (ICD-427.31), sees Dr. Juanito [facility] HYPERTENSION (ICD-401.9) HYPERLIPIDEMIA (ICD-272.4) EDEMA LEG (ICD-782.3) ECZEMA (ICD-692.9 UTI'S, RECURRENT (ICD-599.0), sees Dr. Vonita Moss URTICARIA (ICD-708.9) LOW BACK PAIN, CHRONIC (ICD-724.2) LEG PAIN, BILATERAL (ICD-729.5) GOUT (ICD-274.9) HYPOTHYROIDISM (ICD-244.9) CEREBROVASCULAR ACCIDENT, HX OF (ICD-V12.50) OSTEOARTHRITIS (ICD-715.90) DIVERTICULOSIS, COLON (ICD-562.10) Osteoporosis, last DEXA on 01-30-10  Past Surgical History: Skin Cancer  removed rightt ear 2001 per Dr. Jorja Loa Skin grafts harvested from right cheek Hysterectomy Tonsillectomy Cataract extraction per Dr. Santiago Bumpers at [facility] colonoscopy 09-16-05 per Dr. Arlyce Dice, repeat 5 yrs band ligation  Past History:  Care Management: Cardiology:Dr Wall Gastroenterology: Dr Arlyce Dice Urology:Dr Vonita Moss  Ophthalmology: Dr Santiago Bumpers   Family History: Reviewed history from 10/18/2007 and no changes required. Family History  of Arthritis Family History High cholesterol Family History Hypertension Family History of Stroke F 1st degree relative <60  Social History: Reviewed history from 05/21/2009 and no changes required. lives alone                                                                                  Former Smoker Drug use-no Regular Exercise - yes Alcohol Use - yes(occasionally)  Review of Systems  The patient denies anorexia, fever, weight loss, weight gain, vision loss, decreased hearing, hoarseness, chest pain, syncope, dyspnea on exertion, peripheral edema, prolonged cough, headaches, hemoptysis, abdominal pain, melena, hematochezia, severe indigestion/heartburn, hematuria, incontinence, genital sores, muscle weakness, suspicious skin lesions, transient blindness, difficulty walking, depression, unusual weight change, abnormal bleeding, enlarged lymph nodes, angioedema, breast masses, and testicular masses.    Physical Exam  General:  thin, walks with a quad cane  Head:  Normocephalic and atraumatic without obvious abnormalities. No apparent alopecia or balding. Eyes:  No corneal or conjunctival inflammation noted. EOMI. Perrla. Funduscopic exam benign, without hemorrhages, exudates or papilledema. Vision grossly normal. Ears:  External ear exam shows no significant lesions or deformities.  Otoscopic examination reveals clear canals, tympanic membranes are intact bilaterally without bulging, retraction, inflammation or discharge. Hearing is grossly  normal bilaterally. Nose:  External nasal examination shows no deformity or inflammation. Nasal mucosa are pink and moist without lesions or exudates. Mouth:  Oral mucosa and oropharynx without lesions or exudates.  Teeth in good repair. Neck:  No deformities, masses, or tenderness noted. Chest Wall:  No deformities, masses, or tenderness noted. Breasts:  No mass, nodules, thickening, tenderness, bulging, retraction, inflamation, nipple discharge or  skin changes noted.   Lungs:  Normal respiratory effort, chest expands symmetrically. Lungs are clear to auscultation, no crackles or wheezes. Heart:  normal rate, no murmur, no gallop, no rub, no JVD, and irregular rhythm.   Abdomen:  Bowel sounds positive,abdomen soft and non-tender without masses, organomegaly or hernias noted. Msk:  No deformity or scoliosis noted of thoracic or lumbar spine.   Pulses:  R and L carotid,radial,femoral,dorsalis pedis and posterior tibial pulses are full and equal bilaterally Extremities:  No clubbing, cyanosis, edema, or deformity noted with normal full range of motion of all joints.   Neurologic:  No cranial nerve deficits noted. Station and gait are normal. Plantar reflexes are down-going bilaterally. DTRs are symmetrical throughout. Sensory, motor and coordinative functions appear intact. Skin:  Intact without suspicious lesions or rashes Cervical Nodes:  No lymphadenopathy noted Axillary Nodes:  No palpable lymphadenopathy Inguinal Nodes:  No significant adenopathy Psych:  Cognition and judgment appear intact. Alert and cooperative with normal attention span and concentration. No apparent delusions, illusions, hallucinations   Impression & Recommendations:  Problem # 1:  OSTEOPOROSIS (ICD-733.00)  Her updated medication list for this problem includes:    Vitamin D3 2000 Unit Caps (Cholecalciferol) .[name]... 1 cap once daily    Boniva 150 Mg Tabs (Ibandronate sodium) ..... Once a month  Orders: T-Vitamin D (25-Hydroxy) [phone]) Specimen Handling (09811)  Problem # 2:  ATRIAL FIBRILLATION (ICD-427.31)  Her updated medication list for this problem includes:    Warfarin Sodium 5 Mg Tabs (Warfarin sodium) ..... Use as directed by anticoagulation clinic    Atenolol 25 Mg Tabs (Atenolol) .[name]... Take one tablet by mouth two times a day    Amlodipine Besylate 5 Mg Tabs (Amlodipine besylate) .[name]... Take one tablet by mouth daily  Problem # 3:   HYPERTENSION (ICD-401.9)  Her updated medication list for this problem includes:    Losartan Potassium 100 Mg Tabs (Losartan potassium) .[name]... 1 by mouth once daily    Lasix 20 Mg Tabs (Furosemide) .[name]... 1 by mouth once daily    Atenolol 25 Mg Tabs (Atenolol) .[name]... Take one tablet by mouth two times a day    Amlodipine Besylate 5 Mg Tabs (Amlodipine besylate) .[name]... Take one tablet by mouth daily  Orders: Venipuncture (91478) TLB-Lipid Panel (80061-LIPID) TLB-BMP (Basic Metabolic Panel-BMET) (80048-METABOL) TLB-CBC Platelet - w/Differential (85025-CBCD) TLB-Hepatic/Liver Function Pnl (80076-HEPATIC) TLB-TSH (Thyroid Stimulating Hormone) (84443-TSH) Specimen Handling (29562)  Problem # 4:  HYPERLIPIDEMIA (ICD-272.4)  Problem # 5:  LOW BACK PAIN, CHRONIC (ICD-724.2)  Her updated medication list for this problem includes:    Percocet 5-325 Mg Tabs (Oxycodone-acetaminophen) .[name]... 1-2 every 6 hours as needed pain  Problem # 6:  HYPOTHYROIDISM (ICD-244.9)  Her updated medication list for this problem includes:    Synthroid 50 Mcg Tabs (Levothyroxine sodium) ..... Once daily  Problem # 7:  CEREBROVASCULAR ACCIDENT, HX OF (ICD-V12.50)  Complete Medication List: 1)  Losartan Potassium 100 Mg Tabs (Losartan potassium) .[name].. 1 by mouth once daily 2)  Allopurinol 300 Mg Tabs (Allopurinol) .... Once daily 3)  Lasix 20 Mg Tabs (Furosemide) .Marland KitchenMarland KitchenMarland Kitchen  1 by mouth once daily 4)  Calcium Chews 1000mg   .... 1 chew two times a day 5)  Vitamin B-6 500 Mg Tabs (Pyridoxine hcl) .[name].. 1 by mouth once daily 6)  Synthroid 50 Mcg Tabs (Levothyroxine sodium) .... Once daily 7)  Percocet 5-325 Mg Tabs (Oxycodone-acetaminophen) .[name].. 1-2 every 6 hours as needed pain 8)  Warfarin Sodium 5 Mg Tabs (Warfarin sodium) .... Use as directed by anticoagulation clinic 9)  Vitamin D3 2000 Unit Caps (Cholecalciferol) .[name].. 1 cap once daily 10)  Atenolol 25 Mg Tabs (Atenolol) .... Take one tablet by mouth two times a day 11)   Amlodipine Besylate 5 Mg Tabs (Amlodipine besylate) .... Take one tablet by mouth daily 12)  Boniva 150 Mg Tabs (Ibandronate sodium) .... Once a month  Other Orders: Tdap => 83yrs IM (54098) Admin 1st Vaccine (11914)  Patient Instructions: 1)  Schedule your mammogram.  2)  Schedule a colonoscopy/ sigmoidoscopy to help detect colon cancer.  3)  get labs today   Orders Added: 1)  Tdap => 29yrs IM [90715] 2)  Admin 1st Vaccine [90471] 3)  Est. Patient Level IV [78295] 4)  Venipuncture [36415] 5)  TLB-Lipid Panel [80061-LIPID] 6)  TLB-BMP (Basic Metabolic Panel-BMET) [80048-METABOL] 7)  TLB-CBC Platelet - w/Differential [85025-CBCD] 8)  TLB-Hepatic/Liver Function Pnl [80076-HEPATIC] 9)  TLB-TSH (Thyroid Stimulating Hormone) [84443-TSH] 10)  T-Vitamin D (25-Hydroxy) [62130-86578] 11)  Specimen Handling [99000]   Immunizations Administered:  Tetanus Vaccine:    Vaccine Type: Tdap    Site: left deltoid    Mfr: GlaxoSmithKline    Dose: 0.5 ml    Route: IM    Given by: Pura Spice, RN    Exp. Date: 07/24/2012    Lot #: [id number]    VIS given: 08/22/08 version given August 06, 2010.   Immunizations Administered:  Tetanus Vaccine:    Vaccine Type: Tdap    Site: left deltoid    Mfr: GlaxoSmithKline    Dose: 0.5 ml    Route: IM    Given by: Pura Spice, RN    Exp. Date: 07/24/2012    Lot #: [id number]    VIS given: 08/22/08 version given August 06, 2010.      Eye Exam  Dr Santiago Bumpers  last eye exam 11/ 2010

## 2010-11-04 NOTE — Assessment & Plan Note (Signed)
Summary: 6 mo f/u /cy  Medications Added * CALCIUM CHEWS 1000MG  1 chew two times a day VITAMIN D3 2000 UNIT CAPS (CHOLECALCIFEROL) 1 cap once daily        Visit Type:  6 mo f/u Primary Provider:  Nelwyn Salisbury MD  CC:  pt states she does not have as much energy....no other complaints today.  History of Present Illness: [name] returns today for evaluation and management history of paroxysmal atrial fibrillation, history of embolic stroke, hypertension, nonobstructive carotid disease.  She remains on Coumadin without complication. She's had no bleeding or melena.  She denies any symptoms of TIAs or mini strokes. She usually had no palpitations, presyncope or syncope. He denies any chest pain.  Clinical Reports Reviewed:  CXR:  06/10/2007: CXR Results:  Clinical Data: Generalized weakness    CHEST - 2 VIEW:    Findings:  The heart size and mediastinal contours are within normal   limits.   Both lungs are clear.  The visualized skeletal structures are   unremarkable.    IMPRESSION:    No active cardiopulmonary disease.    Read By:  Genevive Bi,  M.D.  03/28/2007: CXR Results:  History: Stroke, dyspnea    CHEST 2 VIEWS:    Comparison 03/27/2007    Borderline cardiac enlargement.   Normal mediastinal contours.   Mild pulmonary vascular congestion.   Bronchitic changes with minimal interstitial prominence in primarily   right lung,   increased since preceding study, cannot exclude minimal edema or   asymmetric   infiltrate.   Mild left basilar atelectasis.   Underlying COPD.    IMPRESSION:   Vascular congestion with mild asymmetric interstitial infiltrate   right lung,   question mild edema or early infection.   COPD with left basilar atelectasis.    Read By:  Lollie Marrow,  M.D.   Current Medications (verified): 1)  Losartan Potassium 100 Mg Tabs (Losartan Potassium) .[name].. 1 By Mouth Once Daily 2)  Allopurinol 300 Mg Tabs (Allopurinol) ....  Once Daily 3)  Lasix 20 Mg Tabs (Furosemide) .[name].. 1 By Mouth Once Daily 4)  Multivitamins   Tabs (Multiple Vitamin) .[name].. 1 By Mouth Once Daily 5)  Calcium Chews 1000mg  .... 1 Chew Two Times A Day 6)  Vitamin B-6 500 Mg  Tabs (Pyridoxine Hcl) .[name].. 1 By Mouth Once Daily 7)  Synthroid 50 Mcg  Tabs (Levothyroxine Sodium) .... Once Daily 8)  Percocet 5-325 Mg  Tabs (Oxycodone-Acetaminophen) .[name].. 1-2 Every 6 Hours As Needed Pain 9)  Warfarin Sodium 5 Mg Tabs (Warfarin Sodium) .... Use As Directed By Anticoagulation Clinic 10)  Vitamin D3 2000 Unit Caps (Cholecalciferol) .[name].. 1 Cap Once Daily 11)  Atenolol 25 Mg Tabs (Atenolol) .... Take One Tablet By Mouth Two Times A Day 12)  Amlodipine Besylate 5 Mg Tabs (Amlodipine Besylate) .... Take One Tablet By Mouth Daily 13)  Boniva 150 Mg Tabs (Ibandronate Sodium) .... Once A Month  Allergies: 1)  ! Amoxicillin 2)  ! Vicodin 3)  ! * Zelnorm 4)  ! Macrobid 5)  ! Sulfa  Past History:  Past Medical History: Last updated: 05/21/2009 ATRIAL FIBRILLATION (ICD-427.31), sees Dr. Juanito Doom and the [facility] HYPERTENSION (ICD-401.9) HYPERLIPIDEMIA (ICD-272.4) EDEMA LEG (ICD-782.3) ECZEMA (ICD-692.9) VIRAL URI (ICD-465.9) UTI'S, RECURRENT (ICD-599.0), sees Dr. Vonita Moss ACUTE CYSTITIS (ICD-595.0) URTICARIA (ICD-708.9) LOW BACK PAIN, CHRONIC (ICD-724.2) ? of UNS ADVRS EFF UNS RX MEDICINAL&BIOLOGICAL SBSTNC (ICD-995.20) RASH AND OTHER NONSPECIFIC SKIN ERUPTION (ICD-782.1) HIP THIGH LEG&ANK  BLISTER WITHOUT MENTION INF (ICD-916.2) LEG PAIN, BILATERAL (ICD-729.5) GOUT (ICD-274.9) HYPOTHYROIDISM (ICD-244.9) CEREBROVASCULAR ACCIDENT, HX OF (ICD-V12.50) OSTEOARTHRITIS (ICD-715.90) DIVERTICULOSIS, COLON (ICD-562.10)  Past Surgical History: Last updated: 01/25/2009 Skin Cancer  removed rt ear 2001 per Dr. Jorja Loa Skin grafts harvested from rt cheek Hysterectomy Tonsillectomy Cataract extraction colonoscopy 09-16-05 per Dr. Arlyce Dice, repeat 5  yrs band ligation  Family History: Last updated: 10/18/2007 Family History of Arthritis Family History High cholesterol Family History Hypertension Family History of Stroke F 1st degree relative <60  Social History: Last updated: 05/21/2009 lives alone                                                                                  Former Smoker Drug use-no Regular Exercise - yes Alcohol Use - yes(occasionally)  Risk Factors: Exercise: yes (01/25/2009)  Risk Factors: Smoking Status: current (01/25/2009)  Review of Systems       negative other than history of present illness  Vital Signs:  Patient profile:   77 year old female Height:      61 inches Weight:      144 pounds BMI:     27.31 Pulse rate:   56 / minute Pulse rhythm:   irregular BP sitting:   106 / 70  (left arm) Cuff size:   large  Vitals Entered By: Danielle Rankin, CMA (May 06, 2010 12:02 PM)  Physical Exam  General:  Well developed, well nourished, in no acute distress. Head:  normocephalic and atraumatic Eyes:  PERRLA/EOM intact; conjunctiva and lids normal. Neck:  Neck supple, no JVD. No masses, thyromegaly or abnormal cervical nodes. Chest [name]:  no deformities or breast masses noted Lungs:  Clear bilaterally to auscultation and percussion. Heart:  PMI nondisplaced, regular rate and rhythm, normal S1-S2, soft right carotid bruit, varicose veins but no sign of DVT. Abdomen:  Bowel sounds positive; abdomen soft and non-tender without masses, organomegaly, or hernias noted. No hepatosplenomegaly. Msk:  decreased ROM.   Pulses:  pulses normal in all 4 extremities Extremities:  No clubbing or cyanosis. Neurologic:  Alert and oriented x 3. Skin:  Intact without lesions or rashes. Psych:  Normal affect.   Problems:  Medical Problems Added: 1)  Dx of Carotid Artery Stenosis, Without Infarction  (ICD-433.10)  EKG  Procedure date:  05/06/2010  Findings:      sinus bradycardia, no  change.  Impression & Recommendations:  Problem # 1:  ATRIAL FIBRILLATION (ICD-427.31) Assessment Improved  Her updated medication list for this problem includes:    Warfarin Sodium 5 Mg Tabs (Warfarin sodium) ..... Use as directed by anticoagulation clinic    Atenolol 25 Mg Tabs (Atenolol) .[name]... Take one tablet by mouth two times a day  Orders: EKG w/ Interpretation (93000) Carotid Duplex (Carotid Duplex)  Problem # 2:  CEREBRAL EMBOLISM WITH CEREBRAL INFARCTION (ICD-434.11) Assessment: Unchanged  Her updated medication list for this problem includes:    Warfarin Sodium 5 Mg Tabs (Warfarin sodium) ..... Use as directed by anticoagulation clinic  Orders: Carotid Duplex (Carotid Duplex)  Problem # 3:  BRADYCARDIA (ICD-427.89) Assessment: Unchanged  Her updated medication list for this problem includes:    Warfarin Sodium 5 Mg  Tabs (Warfarin sodium) ..... Use as directed by anticoagulation clinic    Atenolol 25 Mg Tabs (Atenolol) .[name]... Take one tablet by mouth two times a day    Amlodipine Besylate 5 Mg Tabs (Amlodipine besylate) .[name]... Take one tablet by mouth daily  Problem # 4:  HYPERTENSION (ICD-401.9) Assessment: Improved  Her updated medication list for this problem includes:    Losartan Potassium 100 Mg Tabs (Losartan potassium) .[name]... 1 by mouth once daily    Lasix 20 Mg Tabs (Furosemide) .[name]... 1 by mouth once daily    Atenolol 25 Mg Tabs (Atenolol) .[name]... Take one tablet by mouth two times a day    Amlodipine Besylate 5 Mg Tabs (Amlodipine besylate) .[name]... Take one tablet by mouth daily  Problem # 5:  EDEMA LEG (ICD-782.3) Assessment: Improved  Problem # 6:  HYPERLIPIDEMIA (ICD-272.4)  Orders: Carotid Duplex (Carotid Duplex)  Problem # 7:  CAROTID ARTERY STENOSIS, WITHOUT INFARCTION (ICD-433.10) she That is asymptomatic. We'll obtain carotid Dopplers. Her updated medication list for this problem includes:    Warfarin Sodium 5 Mg Tabs (Warfarin sodium) ..... Use  as directed by anticoagulation clinic  Patient Instructions: 1)  Your physician recommends that you schedule a follow-up appointment in: 6 months with Dr. Daleen Squibb 2)  Your physician recommends that you continue on your current medications as directed. Please refer to the Current Medication list given to you today. 3)  Your physician has requested that you have a carotid duplex. This test is an ultrasound of the carotid arteries in your neck. It looks at blood flow through these arteries that supply the brain with blood. Allow one hour for this exam. There are no restrictions or special instructions.

## 2010-11-04 NOTE — Medication Information (Signed)
Summary: [name]    js  Anticoagulant Therapy  Managed by: Cloyde Reams, RN, BSN Referring MD: Valera Castle MD PCP: Nelwyn Salisbury MD Supervising MD: Excell Seltzer MD, Casimiro Needle Indication 1: Atrial Fibrillation (ICD-427.31) Lab Used: [facility] Site: [facility] INR POC 3.1 INR RANGE 2 - 3  Dietary changes: no    Health status changes: no    Bleeding/hemorrhagic complications: no    Recent/future hospitalizations: no    Any changes in medication regimen? no    Recent/future dental: no  Any missed doses?: no       Is patient compliant with meds? yes       Allergies: 1)  ! Amoxicillin 2)  ! Vicodin 3)  ! * Zelnorm 4)  ! Macrobid 5)  ! Sulfa  Anticoagulation Management History:      The patient is taking warfarin and comes in today for a routine follow up visit.  Positive risk factors for bleeding include an age of 75 years or older and history of CVA/TIA.  The bleeding index is 'intermediate risk'.  Positive CHADS2 values include History of HTN, Age > 57 years old, and Prior Stroke/CVA/TIA.  The start date was 05/09/2007.  Her last INR was 2.0 RATIO.  Anticoagulation responsible provider: Excell Seltzer MD, Casimiro Needle.  INR POC: 3.1.  Exp: 11/2010.    Anticoagulation Management Assessment/Plan:      The patient's current anticoagulation dose is Warfarin sodium 5 mg tabs: Use as directed by Anticoagulation Clinic.  The target INR is 2 - 3.  The next INR is due 11/26/2009.  Anticoagulation instructions were given to patient.  Results were reviewed/authorized by Cloyde Reams, RN, BSN.  She was notified by Cloyde Reams RN.         Prior Anticoagulation Instructions: INR = 3.2  Pt states she has been eating fewer greens for the holidays.  Encouraged to get back to her normal amount of greens.  The patient is to continue with the same dose of coumadin.  This dosage includes:  take only 1/2 tablet tonight (Tuesday) then resume regular dose of 1 tablet every day except 1/2 tablet on  Mondays.  Recheck in 4 weeks.  Took blood pressure during visit.  146/74.   Current Anticoagulation Instructions: INR 3.1  Start taking 1 tablet daily except 1/2 tablet on Mondays and Thursdays.  Recheck in 4 weeks.

## 2010-11-04 NOTE — Progress Notes (Signed)
Summary: med change  Phone Note Call from Patient Call back at [facility] Phone [phone]   Caller: Patient Summary of Call: [facility] sent her levothyroxine instead of synthroid. Is it ok to take this instead of brand? Initial call taken by: VM  Follow-up for Phone Call        yes this would be fine to take Follow-up by: Nelwyn Salisbury MD,  Feb 21, 2010 2:43 PM  Additional Follow-up for Phone Call Additional follow up Details #1::        Phone Call Completed Additional Follow-up by: Raechel Ache, RN,  Feb 21, 2010 4:33 PM

## 2010-11-04 NOTE — Progress Notes (Signed)
Summary: boniva  Phone Note Other Incoming   Summary of Call: PA process initiated change in med per Dr. Clent Ridges:  Change from Actonel to Boniva 150mg  once a month 1/11 refills.  Patient aware.  [facility]  Done.   Initial call taken by: Rudy Jew, RN,  Feb 19, 2010 9:59 AM    New/Updated Medications: BONIVA 150 MG TABS (IBANDRONATE SODIUM) Once a month Prescriptions: BONIVA 150 MG TABS (IBANDRONATE SODIUM) Once a month  #1 x 11   Entered by:   Rudy Jew, RN   Authorized by:   Nelwyn Salisbury MD   Signed by:   Rudy Jew, RN on 02/19/2010   Method used:   Electronically to        [id number] ([facility])       [address]       [place], [place]  [address]       Ph: [phone] or [phone]       Fax: [phone]   RxID:   [id number]

## 2010-11-04 NOTE — Medication Information (Signed)
Summary: rov/ewj  Anticoagulant Therapy  Managed by: Cloyde Reams, RN, BSN Referring MD: Valera Castle MD PCP: Nelwyn Salisbury MD Supervising MD: Eden Emms MD, Theron Arista Indication 1: Atrial Fibrillation (ICD-427.31) Lab Used: [facility] Site: [facility] INR POC 2.2 INR RANGE 2 - 3  Dietary changes: no    Health status changes: no    Bleeding/hemorrhagic complications: no    Recent/future hospitalizations: no    Any changes in medication regimen? no    Recent/future dental: no  Any missed doses?: no       Is patient compliant with meds? yes       Allergies: 1)  ! Amoxicillin 2)  ! Vicodin 3)  ! * Zelnorm 4)  ! Macrobid 5)  ! Sulfa  Anticoagulation Management History:      The patient is taking warfarin and comes in today for a routine follow up visit.  Positive risk factors for bleeding include an age of 75 years or older and history of CVA/TIA.  The bleeding index is 'intermediate risk'.  Positive CHADS2 values include History of HTN, Age > 104 years old, and Prior Stroke/CVA/TIA.  The start date was 05/09/2007.  Her last INR was 2.0 RATIO.  Anticoagulation responsible provider: Eden Emms MD, Theron Arista.  INR POC: 2.2.  Exp: 01/2011.    Anticoagulation Management Assessment/Plan:      The patient's current anticoagulation dose is Warfarin sodium 5 mg tabs: Use as directed by Anticoagulation Clinic.  The target INR is 2 - 3.  The next INR is due 01/21/2010.  Anticoagulation instructions were given to patient.  Results were reviewed/authorized by Cloyde Reams, RN, BSN.  She was notified by Cloyde Reams RN.         Prior Anticoagulation Instructions: INR 2.0  Continue on same dosage 1 tablet daily except 1/2 tablet on Mondays and Thursdays.  Recheck in 4 weeks.    Current Anticoagulation Instructions: INR 2.2  Continue on same dosage 1 tablet daily except 1/2 tablet on Mondays and Thursdays.  Recheck in 4 weeks.

## 2010-11-04 NOTE — Progress Notes (Signed)
Summary: LMTCB ov Dr. Carmon Ginsberg  Phone Note Call from Patient   Caller: Patient Reason for Call: Refill Medication Summary of Call: wants refill on oxycodone states been getting 240  will pick up  call [phone] Initial call taken by: Pura Spice, RN,  June 23, 2010 9:58 AM  Follow-up for Phone Call        she needs an OV. Its been over a year since her last visit Follow-up by: Nelwyn Salisbury MD,  June 23, 2010 10:28 AM  Additional Follow-up for Phone Call Additional follow up Details #1::        Left message to make appointment with Dr. Clent Ridges. Additional Follow-up by: Rudy Jew, RN,  June 23, 2010 11:54 AM

## 2010-11-04 NOTE — Medication Information (Signed)
Summary: rov/tm  Anticoagulant Therapy  Managed by: Weston Brass, PharmD Referring MD: Valera Castle MD PCP: Nelwyn Salisbury MD Supervising MD: Eden Emms MD, Theron Arista Indication 1: Atrial Fibrillation (ICD-427.31) Lab Used: [facility] Site: [facility] INR POC 1.9 INR RANGE 2 - 3  Dietary changes: no    Health status changes: no    Bleeding/hemorrhagic complications: no    Recent/future hospitalizations: no    Any changes in medication regimen? no    Recent/future dental: no  Any missed doses?: no       Is patient compliant with meds? yes       Allergies: 1)  ! Amoxicillin 2)  ! Vicodin 3)  ! * Zelnorm 4)  ! Macrobid 5)  ! Sulfa  Anticoagulation Management History:      The patient is taking warfarin and comes in today for a routine follow up visit.  Positive risk factors for bleeding include an age of 16 years or older and history of CVA/TIA.  The bleeding index is 'intermediate risk'.  Positive CHADS2 values include History of HTN, Age > 22 years old, and Prior Stroke/CVA/TIA.  The start date was 05/09/2007.  Her last INR was 2.0 RATIO.  Anticoagulation responsible provider: Eden Emms MD, Theron Arista.  INR POC: 1.9.  Cuvette Lot#: [id number].  Exp: 08/2011.    Anticoagulation Management Assessment/Plan:      The patient's current anticoagulation dose is Warfarin sodium 5 mg tabs: Use as directed by Anticoagulation Clinic.  The target INR is 2 - 3.  The next INR is due 06/24/2010.  Anticoagulation instructions were given to patient.  Results were reviewed/authorized by Weston Brass, PharmD.  She was notified by Liana Gerold, PharmD Candidate.         Prior Anticoagulation Instructions: INR 2.0 Continue 5mg s everyday except 2.5mg s on Mondays and Thursdays. Recheck in 4 weeks.   Current Anticoagulation Instructions: INR 1.9  Begin taking 1 tablet daily except 1/2 tablet on Mondays.  Return to clinic in 3 weeks.

## 2010-11-04 NOTE — Assessment & Plan Note (Signed)
Summary: F6M/ANAS    Visit Type:  6 mo f/u Primary Provider:  Nelwyn Salisbury MD  CC:  pt is up 7 lb bur says this good since she had lost some weight.....sob when walking up hil...no edema or cp.  History of Present Illness: [name] returns today for followup and management of her chronic atrial fibrillation, anticoagulation, labile systolic blood pressure, and lower shin edema.  She's had no symptoms of TIAs or mini strokes. She's had no bleeding or melena. She denies any palpitations in fact she says been totally free of any symptoms since I was diagnosed with atrial relation.  Her blood pressure readings have varied systolically from about 135-180.  Current Medications (verified): 1)  Atacand 32 Mg Tabs (Candesartan Cilexetil) .... Once Daily 2)  Allopurinol 300 Mg Tabs (Allopurinol) .... Once Daily 3)  Lasix 20 Mg Tabs (Furosemide) .[name].. 1 By Mouth Once Daily 4)  Multivitamins   Tabs (Multiple Vitamin) .[name].. 1 By Mouth Once Daily 5)  Calcium 600 1500 Mg  Tabs (Calcium Carbonate) .[name].. 1 By Mouth Once Daily 6)  Vitamin B-6 500 Mg  Tabs (Pyridoxine Hcl) .[name].. 1 By Mouth Once Daily 7)  Synthroid 50 Mcg  Tabs (Levothyroxine Sodium) .... Once Daily 8)  Percocet 5-325 Mg  Tabs (Oxycodone-Acetaminophen) .[name].. 1-2 Every 6 Hours As Needed Pain 9)  Warfarin Sodium 5 Mg Tabs (Warfarin Sodium) .... Use As Directed By Anticoagulation Clinic 10)  Vitamin D 1000 Unit Tabs (Cholecalciferol) .[name].. 1 Tab Once Daily 11)  Atenolol 25 Mg Tabs (Atenolol) .... Take One Tablet By Mouth Two Times A Day 12)  Amlodipine Besylate 5 Mg Tabs (Amlodipine Besylate) .... Take One Tablet By Mouth Daily  Allergies: 1)  ! Amoxicillin 2)  ! Vicodin 3)  ! * Zelnorm 4)  ! Macrobid 5)  ! Sulfa  Past History:  Past Medical History: Last updated: 05/21/2009 ATRIAL FIBRILLATION (ICD-427.31), sees Dr. Juanito Doom [facility] HYPERTENSION (ICD-401.9) HYPERLIPIDEMIA (ICD-272.4) EDEMA LEG (ICD-782.3) ECZEMA  (ICD-692.9) VIRAL URI (ICD-465.9) UTI'S, RECURRENT (ICD-599.0), sees Dr. Vonita Moss ACUTE CYSTITIS (ICD-595.0) URTICARIA (ICD-708.9) LOW BACK PAIN, CHRONIC (ICD-724.2) ? of UNS ADVRS EFF UNS RX MEDICINAL&BIOLOGICAL SBSTNC (ICD-995.20) RASH AND OTHER NONSPECIFIC SKIN ERUPTION (ICD-782.1) HIP THIGH LEG&ANK BLISTER WITHOUT MENTION INF (ICD-916.2) LEG PAIN, BILATERAL (ICD-729.5) GOUT (ICD-274.9) HYPOTHYROIDISM (ICD-244.9) CEREBROVASCULAR ACCIDENT, HX OF (ICD-V12.50) OSTEOARTHRITIS (ICD-715.90) DIVERTICULOSIS, COLON (ICD-562.10)  Past Surgical History: Last updated: 01/25/2009 Skin Cancer  removed rt ear 2001 per Dr. Jorja Loa Skin grafts harvested from rt cheek Hysterectomy Tonsillectomy Cataract extraction colonoscopy 09-16-05 per Dr. Arlyce Dice, repeat 5 yrs band ligation  Family History: Last updated: 10/18/2007 Family History of Arthritis Family History High cholesterol Family History Hypertension Family History of Stroke F 1st degree relative <60  Social History: Last updated: 05/21/2009 lives alone                                                                                  Former Smoker Drug use-no Regular Exercise - yes Alcohol Use - yes(occasionally)  Risk Factors: Exercise: yes (01/25/2009)  Risk Factors: Smoking Status: current (01/25/2009)  Review of Systems       negative other than history of  present illness  Vital Signs:  Patient profile:   77 year old female Height:      61 inches Weight:      113 pounds BMI:     21.43 Pulse rate:   64 / minute Pulse rhythm:   regular BP sitting:   147 / 70  (left arm) Cuff size:   large  Vitals Entered By: Danielle Rankin, CMA (November 07, 2009 4:37 PM)  Physical Exam  General:  Well developed, well nourished, in no acute distress. Head:  normocephalic and atraumatic Neck:  Neck supple, no JVD. No masses, thyromegaly or abnormal cervical nodes. Lungs:  Clear bilaterally to auscultation and percussion. Heart:   irregular rate and rhythm, variable S1-S2, well-controlled rate. Msk:  decreased ROM.   Pulses:  pulses normal in all 4 extremities Extremities:  No clubbing or cyanosis. Neurologic:  Alert and oriented x 3. Skin:  Intact without lesions or rashes. Psych:  Normal affect.   Impression & Recommendations:  Problem # 1:  ATRIAL FIBRILLATION (ICD-427.31) Assessment Unchanged  Her updated medication list for this problem includes:    Warfarin Sodium 5 Mg Tabs (Warfarin sodium) ..... Use as directed by anticoagulation clinic    Atenolol 25 Mg Tabs (Atenolol) .[name]... Take one tablet by mouth two times a day  Her updated medication list for this problem includes:    Warfarin Sodium 5 Mg Tabs (Warfarin sodium) ..... Use as directed by anticoagulation clinic    Atenolol 25 Mg Tabs (Atenolol) .[name]... Take one tablet by mouth two times a day  Problem # 2:  HYPERTENSION (ICD-401.9) Assessment: Unchanged The variation in her systolic blood pressures probably due to her atrial fibrillation. I checked it slowly and was around 145-147. I explained this to her and her daughter at length. I do not want to add or increase in medication at this point. Her updated medication list for this problem includes:    Atacand 32 Mg Tabs (Candesartan cilexetil) ..... Once daily    Lasix 20 Mg Tabs (Furosemide) .[name]... 1 by mouth once daily    Atenolol 25 Mg Tabs (Atenolol) .[name]... Take one tablet by mouth two times a day    Amlodipine Besylate 5 Mg Tabs (Amlodipine besylate) .[name]... Take one tablet by mouth daily  Her updated medication list for this problem includes:    Atacand 32 Mg Tabs (Candesartan cilexetil) ..... Once daily    Lasix 20 Mg Tabs (Furosemide) .[name]... 1 by mouth once daily    Atenolol 25 Mg Tabs (Atenolol) .[name]... Take one tablet by mouth two times a day    Amlodipine Besylate 5 Mg Tabs (Amlodipine besylate) .[name]... 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 Use as directed by anticoagulation clinic  Problem # 4:  EDEMA LEG (ICD-782.3) Assessment: Improved  Patient Instructions: 1)  Your physician recommends that you schedule a follow-up appointment in: 6 [facility] WITH DR Zaina Jenkin 2)  Your physician recommends that you continue on your current medications as directed. Please refer to the Current Medication list given to you today.

## 2010-11-04 NOTE — Medication Information (Signed)
Summary: rov/tm  Anticoagulant Therapy  Managed by: Bethena Midget, RN, BSN Referring MD: Valera Castle MD PCP: Nelwyn Salisbury MD Supervising MD: Juanda Chance MD, Bruce Indication 1: Atrial Fibrillation (ICD-427.31) Lab Used: [facility] Site: [facility] INR POC 2.0 INR RANGE 2 - 3  Dietary changes: no    Health status changes: no    Bleeding/hemorrhagic complications: no    Recent/future hospitalizations: no    Any changes in medication regimen? no    Recent/future dental: no  Any missed doses?: no       Is patient compliant with meds? yes      Comments: Seeing Dr Daleen Squibb today  Allergies: 1)  ! Amoxicillin 2)  ! Vicodin 3)  ! * Zelnorm 4)  ! Macrobid 5)  ! Sulfa  Anticoagulation Management History:      The patient is taking warfarin and comes in today for a routine follow up visit.  Positive risk factors for bleeding include an age of 28 years or older and history of CVA/TIA.  The bleeding index is 'intermediate risk'.  Positive CHADS2 values include History of HTN, Age > 34 years old, and Prior Stroke/CVA/TIA.  The start date was 05/09/2007.  Her last INR was 2.0 RATIO.  Anticoagulation responsible Xion Debruyne: Juanda Chance MD, Smitty Cords.  INR POC: 2.0.  Cuvette Lot#: [id number].  Exp: 07/2011.    Anticoagulation Management Assessment/Plan:      The patient's current anticoagulation dose is Warfarin sodium 5 mg tabs: Use as directed by Anticoagulation Clinic.  The target INR is 2 - 3.  The next INR is due 06/03/2010.  Anticoagulation instructions were given to patient.  Results were reviewed/authorized by Bethena Midget, RN, BSN.  She was notified by Bethena Midget, RN, BSN.         Prior Anticoagulation Instructions: INR 2.0 Continue 5mg s everyday except 2.5mg s on Mondays and Thursdays. REcheck in 2 weeks with MD appt.   Current Anticoagulation Instructions: INR 2.0 Continue 5mg s everyday except 2.5mg s on Mondays and Thursdays. Recheck in 4 weeks.

## 2010-11-04 NOTE — Medication Information (Signed)
Summary: rov/kb  Anticoagulant Therapy  Managed by: Bethena Midget, RN, BSN Referring MD: Valera Castle MD PCP: Nelwyn Salisbury MD Supervising MD: Riley Kill MD, Maisie Fus Indication 1: Atrial Fibrillation (ICD-427.31) Lab Used: [facility] Site: [facility] INR POC 2.8 INR RANGE 2 - 3  Dietary changes: no    Health status changes: yes       Details: UTI's frequent  Bleeding/hemorrhagic complications: no    Recent/future hospitalizations: no    Any changes in medication regimen? yes       Details: Boniva 150mg s  monthly, Enablex 7.5mg s daily, Trimethoprim 100mg s Qhs  Recent/future dental: no  Any missed doses?: yes     Details: ? missed Saturdays dose  Is patient compliant with meds? yes       Current Medications (verified): 1)  Losartan Potassium 100 Mg Tabs (Losartan Potassium) .[name].. 1 By Mouth Once Daily 2)  Allopurinol 300 Mg Tabs (Allopurinol) .... Once Daily 3)  Lasix 20 Mg Tabs (Furosemide) .[name].. 1 By Mouth Once Daily 4)  Multivitamins   Tabs (Multiple Vitamin) .[name].. 1 By Mouth Once Daily 5)  Calcium 600 1500 Mg  Tabs (Calcium Carbonate) .[name].. 1 By Mouth Once Daily 6)  Vitamin B-6 500 Mg  Tabs (Pyridoxine Hcl) .[name].. 1 By Mouth Once Daily 7)  Synthroid 50 Mcg  Tabs (Levothyroxine Sodium) .... Once Daily 8)  Percocet 5-325 Mg  Tabs (Oxycodone-Acetaminophen) .[name].. 1-2 Every 6 Hours As Needed Pain 9)  Warfarin Sodium 5 Mg Tabs (Warfarin Sodium) .... Use As Directed By Anticoagulation Clinic 10)  Vitamin D 1000 Unit Tabs (Cholecalciferol) .[name].. 1 Tab Once Daily 11)  Atenolol 25 Mg Tabs (Atenolol) .... Take One Tablet By Mouth Two Times A Day 12)  Amlodipine Besylate 5 Mg Tabs (Amlodipine Besylate) .... Take One Tablet By Mouth Daily 13)  Boniva 150 Mg Tabs (Ibandronate Sodium) .... Once A Month 14)  Trimethoprim 100 Mg Tabs (Trimethoprim) .... Take 1 Tablet Daily At Bedtime 15)  Enablex 7.5 Mg Xr24h-Tab (Darifenacin Hydrobromide) .... Take 1 Tablet Daily  Allergies: 1)  !  Amoxicillin 2)  ! Vicodin 3)  ! * Zelnorm 4)  ! Macrobid 5)  ! Sulfa  Anticoagulation Management History:      The patient is taking warfarin and comes in today for a routine follow up visit.  Positive risk factors for bleeding include an age of 30 years or older and history of CVA/TIA.  The bleeding index is 'intermediate risk'.  Positive CHADS2 values include History of HTN, Age > 55 years old, and Prior Stroke/CVA/TIA.  The start date was 05/09/2007.  Her last INR was 2.0 RATIO.  Anticoagulation responsible provider: Riley Kill MD, Maisie Fus.  INR POC: 2.8.  Cuvette Lot#: [id number].  Exp: 06/2011.    Anticoagulation Management Assessment/Plan:      The patient's current anticoagulation dose is Warfarin sodium 5 mg tabs: Use as directed by Anticoagulation Clinic.  The target INR is 2 - 3.  The next INR is due 04/22/2010.  Anticoagulation instructions were given to patient.  Results were reviewed/authorized by Bethena Midget, RN, BSN.  She was notified by Bethena Midget, RN, BSN.         Prior Anticoagulation Instructions: INR-2.4 Resume normal dosing schedule. Take half a tablet on Monday and Thursday and take 1 tablet on all other days. Return  in 3 weeks  Current Anticoagulation Instructions: INR 2.8 Continue 5mg s everyday except 2.5mg s on Mondays and Thursdays. Recheck in 4 weeks.

## 2010-11-04 NOTE — Miscellaneous (Signed)
Summary: Living Will and Health Care Power of Attorney  Living Will and Health Care Power of Attorney   Imported By: Maryln Gottron 07/01/2010 14:22:08  _____________________________________________________________________  External Attachment:    Type:   Image     Comment:   External Document

## 2010-11-04 NOTE — Miscellaneous (Signed)
Summary: BONE DENSITY  Clinical Lists Changes  Orders: Added new Test order of T-Bone Densitometry (77080) - Signed Added new Test order of T-Lumbar Vertebral Assessment (77082) - Signed 

## 2010-11-04 NOTE — Progress Notes (Signed)
Summary: pharmacy switch   Phone Note Call from Patient   Caller: Patient Reason for Call: Talk to Nurse Summary of Call: FYI pt is switching from [facility] to [facility], she states we will be receiving a fax from them Initial call taken by: Migdalia Dk,  Feb 13, 2010 3:15 PM  Follow-up for Phone Call        REFILLS SENT TO [facility] VIA EMR. Follow-up by: Scherrie Bateman, LPN,  Feb 14, 2010 12:07 PM

## 2010-11-04 NOTE — Assessment & Plan Note (Signed)
Summary: FLU SHOT/CJR   Nurse Visit   Review of Systems       Flu Vaccine Consent Questions     Do you have a history of severe allergic reactions to this vaccine? no    Any prior history of allergic reactions to egg and/or gelatin? no    Do you have a sensitivity to the preservative Thimersol? no    Do you have a past history of Guillan-Barre Syndrome? no    Do you currently have an acute febrile illness? no    Have you ever had a severe reaction to latex? no    Vaccine information given and explained to patient? yes    Are you currently pregnant? no    Lot Number:[id number]   Exp Date:04/04/2011   Site Given  Left Deltoid IM Josph Macho RMA  June 19, 2010 2:48 PM    Allergies: 1)  ! Amoxicillin 2)  ! Vicodin 3)  ! * Zelnorm 4)  ! Macrobid 5)  ! Sulfa  Orders Added: 1)  Flu Vaccine 79yrs + MEDICARE PATIENTS [Q2039] 2)  Administration Flu vaccine - MCR [G0008]

## 2010-11-04 NOTE — Medication Information (Signed)
Summary: rov/ewj  Anticoagulant Therapy  Managed by: Cloyde Reams, RN, BSN Referring MD: Valera Castle MD PCP: Nelwyn Salisbury MD Supervising MD: Daleen Squibb MD, Maisie Fus Indication 1: Atrial Fibrillation (ICD-427.31) Lab Used: [facility] Site: [facility] INR POC 2.0 INR RANGE 2 - 3  Dietary changes: yes       Details: Incr intake vit K  Health status changes: no    Bleeding/hemorrhagic complications: no    Recent/future hospitalizations: no    Any changes in medication regimen? yes       Details: Atacand 32mg  d/c d/t incr cost started on losartan 100mg  qd.  Recent/future dental: no  Any missed doses?: no       Is patient compliant with meds? yes       Allergies: 1)  ! Amoxicillin 2)  ! Vicodin 3)  ! * Zelnorm 4)  ! Macrobid 5)  ! Sulfa  Anticoagulation Management History:      The patient is taking warfarin and comes in today for a routine follow up visit.  Positive risk factors for bleeding include an age of 75 years or older and history of CVA/TIA.  The bleeding index is 'intermediate risk'.  Positive CHADS2 values include History of HTN, Age > 20 years old, and Prior Stroke/CVA/TIA.  The start date was 05/09/2007.  Her last INR was 2.0 RATIO.  Anticoagulation responsible provider: Daleen Squibb MD, Maisie Fus.  INR POC: 2.0.  Cuvette Lot#: [id number].  Exp: 01/2011.    Anticoagulation Management Assessment/Plan:      The patient's current anticoagulation dose is Warfarin sodium 5 mg tabs: Use as directed by Anticoagulation Clinic.  The target INR is 2 - 3.  The next INR is due 12/24/2009.  Anticoagulation instructions were given to patient.  Results were reviewed/authorized by Cloyde Reams, RN, BSN.  She was notified by Cloyde Reams RN.         Prior Anticoagulation Instructions: INR 3.1  Start taking 1 tablet daily except 1/2 tablet on Mondays and Thursdays.  Recheck in 4 weeks.     Current Anticoagulation Instructions: INR 2.0  Continue on same dosage 1 tablet daily except 1/2  tablet on Mondays and Thursdays.  Recheck in 4 weeks.

## 2010-11-04 NOTE — Medication Information (Signed)
Summary: rov/ewj  Anticoagulant Therapy  Managed by: Elaina Pattee, PharmD Referring MD: Valera Castle MD PCP: Nelwyn Salisbury MD Supervising MD: Excell Seltzer MD, Casimiro Needle Indication 1: Atrial Fibrillation (ICD-427.31) Lab Used: [facility] Site: [facility] INR POC 3.1 INR RANGE 2 - 3  Dietary changes: yes       Details: May have eaten fewer greens.  Health status changes: no    Bleeding/hemorrhagic complications: no    Recent/future hospitalizations: no    Any changes in medication regimen? no    Recent/future dental: no  Any missed doses?: no       Is patient compliant with meds? yes       Allergies: 1)  ! Amoxicillin 2)  ! Vicodin 3)  ! * Zelnorm 4)  ! Macrobid 5)  ! Sulfa  Anticoagulation Management History:      The patient is taking warfarin and comes in today for a routine follow up visit.  Positive risk factors for bleeding include an age of 23 years or older and history of CVA/TIA.  The bleeding index is 'intermediate risk'.  Positive CHADS2 values include History of HTN, Age > 26 years old, and Prior Stroke/CVA/TIA.  The start date was 05/09/2007.  Her last INR was 2.0 RATIO.  Anticoagulation responsible provider: Excell Seltzer MD, Casimiro Needle.  INR POC: 3.1.  Cuvette Lot#: [id number].  Exp: 03/2011.    Anticoagulation Management Assessment/Plan:      The patient's current anticoagulation dose is Warfarin sodium 5 mg tabs: Use as directed by Anticoagulation Clinic.  The target INR is 2 - 3.  The next INR is due 02/18/2010.  Anticoagulation instructions were given to patient.  Results were reviewed/authorized by Elaina Pattee, PharmD.  She was notified by Elaina Pattee, PharmD.         Prior Anticoagulation Instructions: INR 2.2  Continue on same dosage 1 tablet daily except 1/2 tablet on Mondays and Thursdays.  Recheck in 4 weeks.    Current Anticoagulation Instructions: INR 3.1. Take 0.5 tablet today, then take 1 tablet daily except 0.5 tablet on Mon and Thurs.

## 2010-11-04 NOTE — Medication Information (Signed)
Summary: rov/sel   Anticoagulant Therapy  Managed by:  Reina Fuse, PharmD Referring MD: Valera Castle MD PCP: Nelwyn Salisbury MD Supervising MD: Antoine Poche MD, Fayrene Fearing Indication 1: Atrial Fibrillation (ICD-427.31) Lab Used: [facility] Site: [facility] INR POC 2.1 INR RANGE 2 - 3  Dietary changes: no    Health status changes: no    Bleeding/hemorrhagic complications: no    Recent/future hospitalizations: no    Any changes in medication regimen? no    Recent/future dental: no  Any missed doses?: no       Is patient compliant with meds? yes       Allergies: 1)  ! Amoxicillin 2)  ! Vicodin 3)  ! * Zelnorm 4)  ! Macrobid 5)  ! Sulfa  Anticoagulation Management History:      The patient is taking warfarin and comes in today for a routine follow up visit.  Positive risk factors for bleeding include an age of 9 years or older and history of CVA/TIA.  The bleeding index is 'intermediate risk'.  Positive CHADS2 values include History of HTN, Age > 59 years old, and Prior Stroke/CVA/TIA.  The start date was 05/09/2007.  Her last INR was 2.0 RATIO.  Anticoagulation responsible provider: Antoine Poche MD, Fayrene Fearing.  INR POC: 2.1.  Cuvette Lot#: [id number].  Exp: 08/2011.    Anticoagulation Management Assessment/Plan:      The patient's current anticoagulation dose is Warfarin sodium 5 mg tabs: Use as directed by Anticoagulation Clinic.  The target INR is 2 - 3.  The next INR is due 09/11/2010.  Anticoagulation instructions were given to patient.  Results were reviewed/authorized by  Reina Fuse, PharmD.  She was notified by Reina Fuse PharmD.         Prior Anticoagulation Instructions: INR 2.1  Continue taking 1 tablet everyday except 1/2 tablet on Monday. Recheck in 4 weeks.   Current Anticoagulation Instructions: INR 2.1  Continue taking Coumadin 1 tab (5 mg) on all days except Coumadin 0.5 tab (2.5 mg) on Mondays. Return to clinic in 4 weeks.

## 2010-11-04 NOTE — Medication Information (Signed)
Summary: rov/cb  Anticoagulant Therapy  Managed by: Bethena Midget, RN, BSN Referring MD: Valera Castle MD PCP: Nelwyn Salisbury MD Supervising MD: Graciela Husbands MD, Viviann Spare Indication 1: Atrial Fibrillation (ICD-427.31) Lab Used[facility]  Site: [facility] INR POC 3.1 INR RANGE 2 - 3  Dietary changes: no    Health status changes: no    Bleeding/hemorrhagic complications: no    Recent/future hospitalizations: no    Any changes in medication regimen? yes       Details: Will start Actonel monthly. Calcium and Vitamin D dose increased  Recent/future dental: no  Any missed doses?: no       Is patient compliant with meds? yes       Allergies: 1)  ! Amoxicillin 2)  ! Vicodin 3)  ! * Zelnorm 4)  ! Macrobid 5)  ! Sulfa  Anticoagulation Management History:      The patient is taking warfarin and comes in today for a routine follow up visit.  Positive risk factors for bleeding include an age of 70 years or older and history of CVA/TIA.  The bleeding index is 'intermediate risk'.  Positive CHADS2 values include History of HTN, Age > 45 years old, and Prior Stroke/CVA/TIA.  The start date was 05/09/2007.  Her last INR was 2.0 RATIO.  Anticoagulation responsible provider: Graciela Husbands MD, Viviann Spare.  INR POC: 3.1.  Cuvette Lot#: [id number].  Exp: 05/2011.    Anticoagulation Management Assessment/Plan:      The patient's current anticoagulation dose is Warfarin sodium 5 mg tabs: Use as directed by Anticoagulation Clinic.  The target INR is 2 - 3.  The next INR is due 03/04/2010.  Anticoagulation instructions were given to patient.  Results were reviewed/authorized by Bethena Midget, RN, BSN.  She was notified by Bethena Midget, RN, BSN.         Prior Anticoagulation Instructions: INR 3.1. Take 0.5 tablet today, then take 1 tablet daily except 0.5 tablet on Mon and Thurs.  Current Anticoagulation Instructions: INR 3.1 Today skip dose then resume 5mg s daily except 2.5mg  on Mondays and Thursdays. Recheck in 2  weeks.

## 2010-11-06 ENCOUNTER — Ambulatory Visit: Admit: 2010-11-06 | Payer: Self-pay

## 2010-11-06 ENCOUNTER — Encounter: Payer: Self-pay | Admitting: Cardiology

## 2010-11-06 ENCOUNTER — Encounter (INDEPENDENT_AMBULATORY_CARE_PROVIDER_SITE_OTHER): Payer: Medicare Other

## 2010-11-06 DIAGNOSIS — I4891 Unspecified atrial fibrillation: Secondary | ICD-10-CM

## 2010-11-06 DIAGNOSIS — Z7901 Long term (current) use of anticoagulants: Secondary | ICD-10-CM

## 2010-11-06 NOTE — Medication Information (Signed)
Summary: rov/tm   Anticoagulant Therapy  Managed by: Louann Sjogren, PharmD Referring MD: Valera Castle MD PCP: Nelwyn Salisbury MD Supervising MD: Myrtis Ser MD,Stefen Juba Indication 1: Atrial Fibrillation (ICD-427.31) Lab Used: [facility] Site: [facility] INR POC 3.1 INR RANGE 2 - 3  Dietary changes: yes       Details: Hasn't been eating her greens over the holiday season.  Health status changes: no    Bleeding/hemorrhagic complications: no    Recent/future hospitalizations: no    Any changes in medication regimen? no    Recent/future dental: no  Any missed doses?: no       Is patient compliant with meds? yes       Allergies: 1)  ! Amoxicillin 2)  ! Vicodin 3)  ! * Zelnorm 4)  ! Macrobid 5)  ! Sulfa  Anticoagulation Management History:      The patient is taking warfarin and comes in today for a routine follow up visit.  Positive risk factors for bleeding include an age of 75 years or older and history of CVA/TIA.  The bleeding index is 'intermediate risk'.  Positive CHADS2 values include History of HTN, Age > 75 years old, and Prior Stroke/CVA/TIA.  The start date was 05/09/2007.  Her last INR was 2.0 RATIO and today's INR is 3.1.  Anticoagulation responsible provider: Myrtis Ser MD,Phu Record.  INR POC: 3.1.  Cuvette Lot#: [id number].  Exp: 11/2011.    Anticoagulation Management Assessment/Plan:      The patient's current anticoagulation dose is Warfarin sodium 5 mg tabs: Use as directed by Anticoagulation Clinic.  The target INR is 2 - 3.  The next INR is due 10/30/2010.  Anticoagulation instructions were given to patient.  Results were reviewed/authorized by Louann Sjogren, PharmD.  She was notified by Weston Brass PharmD.         Prior Anticoagulation Instructions: INR 2.1 Continue 5mg s everyday except 2.5mg  on Mondays. Recheck in 4 weeks.    Current Anticoagulation Instructions: INR 3.1 (goal 2-3)  Continue taking 1 tablet everyday except take 1/2 tablet on Mondays.  Return to  eating 2-3 servings of greens a week as before.  Return to clinic on February 2nd at 11:45AM. Return to clinic in

## 2010-11-12 NOTE — Medication Information (Signed)
Summary: rov/ewj  Anticoagulant Therapy  Managed by: Cloyde Reams, RN, BSN Referring MD: Valera Castle MD PCP: Nelwyn Salisbury MD Supervising MD: Shirlee Latch MD, Aidaly Cordner Indication 1: Atrial Fibrillation (ICD-427.31) Lab Used: [facility] Site: [facility] INR POC 1.7 INR RANGE 2 - 3  Dietary changes: no    Health status changes: no    Bleeding/hemorrhagic complications: no    Recent/future hospitalizations: no    Any changes in medication regimen? no    Recent/future dental: no  Any missed doses?: no       Is patient compliant with meds? yes       Allergies: 1)  ! Amoxicillin 2)  ! Vicodin 3)  ! * Zelnorm 4)  ! Macrobid 5)  ! Sulfa  Anticoagulation Management History:      The patient is taking warfarin and comes in today for a routine follow up visit.  Positive risk factors for bleeding include an age of 28 years or older and history of CVA/TIA.  The bleeding index is 'intermediate risk'.  Positive CHADS2 values include History of HTN, Age > 27 years old, and Prior Stroke/CVA/TIA.  The start date was 05/09/2007.  Her last INR was 3.1.  Anticoagulation responsible provider: Shirlee Latch MD, Khrystal Jeanmarie.  INR POC: 1.7.  Cuvette Lot#: [id number].  Exp: 11/2011.    Anticoagulation Management Assessment/Plan:      The patient's current anticoagulation dose is Warfarin sodium 5 mg tabs: Use as directed by Anticoagulation Clinic.  The target INR is 2 - 3.  The next INR is due 11/25/2010.  Anticoagulation instructions were given to patient.  Results were reviewed/authorized by Cloyde Reams, RN, BSN.  She was notified by Cloyde Reams RN.         Prior Anticoagulation Instructions: INR 3.1 (goal 2-3)  Continue taking 1 tablet everyday except take 1/2 tablet on Mondays.  Return to eating 2-3 servings of greens a week as before.  Return to clinic on February 2nd at 11:45AM. Return to clinic in   Current Anticoagulation Instructions: INR 1.7  Take 1.5 tablets today, then resume same dosage 1  tablet daily except 1/2 tablet on Mondays.  Recheck in 3 weeks.

## 2010-11-18 DIAGNOSIS — I4891 Unspecified atrial fibrillation: Secondary | ICD-10-CM

## 2010-11-25 ENCOUNTER — Encounter: Payer: Self-pay | Admitting: Cardiovascular Disease

## 2010-11-25 ENCOUNTER — Encounter (INDEPENDENT_AMBULATORY_CARE_PROVIDER_SITE_OTHER): Payer: Medicare Other

## 2010-11-25 DIAGNOSIS — Z7901 Long term (current) use of anticoagulants: Secondary | ICD-10-CM

## 2010-11-25 DIAGNOSIS — I4891 Unspecified atrial fibrillation: Secondary | ICD-10-CM

## 2010-12-02 NOTE — Medication Information (Signed)
Summary: [name]  Anticoagulant Therapy  Managed by: Bethena Midget, RN, BSN Referring MD: Valera Castle MD PCP: Nelwyn Salisbury MD Supervising MD: Excell Seltzer MD, Casimiro Needle Indication 1: Atrial Fibrillation (ICD-427.31) Lab Used: [facility] Site: [facility] INR POC 2.3 INR RANGE 2 - 3  Dietary changes: no    Health status changes: no    Bleeding/hemorrhagic complications: no    Recent/future hospitalizations: no    Any changes in medication regimen? no    Recent/future dental: no  Any missed doses?: no       Is patient compliant with meds? yes       Allergies: 1)  ! Amoxicillin 2)  ! Vicodin 3)  ! * Zelnorm 4)  ! Macrobid 5)  ! Sulfa  Anticoagulation Management History:      The patient is taking warfarin and comes in today for a routine follow up visit.  Positive risk factors for bleeding include an age of 40 years or older and history of CVA/TIA.  The bleeding index is 'intermediate risk'.  Positive CHADS2 values include History of HTN, Age > 47 years old, and Prior Stroke/CVA/TIA.  The start date was 05/09/2007.  Her last INR was 3.1.  Anticoagulation responsible provider: Excell Seltzer MD, Casimiro Needle.  INR POC: 2.3.  Cuvette Lot#: [id number].  Exp: 10/2011.    Anticoagulation Management Assessment/Plan:      The patient's current anticoagulation dose is Warfarin sodium 5 mg tabs: Use as directed by Anticoagulation Clinic.  The target INR is 2 - 3.  The next INR is due 12/23/2010.  Anticoagulation instructions were given to patient.  Results were reviewed/authorized by Bethena Midget, RN, BSN.  She was notified by Bethena Midget, RN, BSN.         Prior Anticoagulation Instructions: INR 1.7  Take 1.5 tablets today, then resume same dosage 1 tablet daily except 1/2 tablet on Mondays.  Recheck in 3 weeks.   Current Anticoagulation Instructions: INR 2.3 Continue 1 pill everyday except 1/2 pill on Mondays. Recheck in 4 weeks.

## 2010-12-09 ENCOUNTER — Telehealth (INDEPENDENT_AMBULATORY_CARE_PROVIDER_SITE_OTHER): Payer: Self-pay | Admitting: *Deleted

## 2010-12-11 ENCOUNTER — Telehealth: Payer: Self-pay | Admitting: Family Medicine

## 2010-12-11 MED ORDER — LOSARTAN POTASSIUM 100 MG PO TABS: 100.0000 mg | ORAL_TABLET | Freq: Every day | ORAL | Status: DC

## 2010-12-11 NOTE — Telephone Encounter (Signed)
Refill sent in

## 2010-12-16 NOTE — Progress Notes (Signed)
   Walk in Patient Form Recieved " Pt needs Refill on Anlodipine" sent to [facility]  December 09, 2010 1:16 PM

## 2010-12-23 ENCOUNTER — Ambulatory Visit (INDEPENDENT_AMBULATORY_CARE_PROVIDER_SITE_OTHER): Payer: Medicare Other | Admitting: *Deleted

## 2010-12-23 DIAGNOSIS — I4891 Unspecified atrial fibrillation: Secondary | ICD-10-CM

## 2010-12-23 NOTE — Patient Instructions (Signed)
INR 2.2 Continue 5mg s everyday except 2.5mg s on Mondays. Recheck in 4 weeks.

## 2010-12-31 ENCOUNTER — Other Ambulatory Visit: Payer: Self-pay | Admitting: Dermatology

## 2011-01-08 ENCOUNTER — Encounter: Payer: Self-pay | Admitting: Cardiology

## 2011-01-08 ENCOUNTER — Ambulatory Visit (INDEPENDENT_AMBULATORY_CARE_PROVIDER_SITE_OTHER): Payer: Medicare Other | Admitting: Cardiology

## 2011-01-08 ENCOUNTER — Encounter: Payer: Self-pay | Admitting: *Deleted

## 2011-01-08 VITALS — BP 162/92 | HR 58 | Resp 18 | Ht 62.0 in | Wt 114.4 lb

## 2011-01-08 DIAGNOSIS — I6340 Cerebral infarction due to embolism of unspecified cerebral artery: Secondary | ICD-10-CM

## 2011-01-08 DIAGNOSIS — I4891 Unspecified atrial fibrillation: Secondary | ICD-10-CM

## 2011-01-08 DIAGNOSIS — I498 Other specified cardiac arrhythmias: Secondary | ICD-10-CM

## 2011-01-08 DIAGNOSIS — I6529 Occlusion and stenosis of unspecified carotid artery: Secondary | ICD-10-CM

## 2011-01-08 DIAGNOSIS — I10 Essential (primary) hypertension: Secondary | ICD-10-CM

## 2011-01-08 DIAGNOSIS — E785 Hyperlipidemia, unspecified: Secondary | ICD-10-CM

## 2011-01-08 MED ORDER — ATORVASTATIN CALCIUM 40 MG PO TABS: 40.0000 mg | ORAL_TABLET | Freq: Every day | ORAL | Status: DC

## 2011-01-08 NOTE — Assessment & Plan Note (Signed)
Stable, no change in treatment. With NSR, she is safe to come off Coumadin for 3 days prior to her colonoscopy.

## 2011-01-08 NOTE — Assessment & Plan Note (Signed)
Stable, restart statin!

## 2011-01-08 NOTE — Assessment & Plan Note (Signed)
Restart losarten, if already taking once dght checks meds, will increase amlodopine to 10mg /day.

## 2011-01-08 NOTE — Patient Instructions (Signed)
1. Your physician recommends that you schedule a follow-up appointment in: 6 months with Dr. Daleen Squibb 2. Your physician has recommended you make the following change in your medication: start Lipitor 40mg  daily. Make sure you are taking your Losartan.  If you are taking your Losartan already, then increase your Amlodipine ( Norvasc) to 10mg  daily.  Please call Debbie and let her know if you are/are not taking your Losartan. 3. Your physician recommends that you return for lab work in: 6 weeks for fasting lipid, liver, and bmet and a blood pressure check. 4. You can stop your Coumadin 3 days before your colonoscopy. 5.  Your physician has requested that you regularly monitor and record your blood pressure readings at home. Please use the same machine at the same time of day to check your readings and record them to bring to your follow-up visit. Goal blood pressure for you is less than 140/80.

## 2011-01-08 NOTE — Assessment & Plan Note (Signed)
Re-start Lipitor 

## 2011-01-08 NOTE — Assessment & Plan Note (Signed)
Improved

## 2011-01-08 NOTE — Progress Notes (Signed)
   Patient ID: [name], female    DOB: 09-05-1926, 77 y.o.   MRN: [id number]  HPI  [name] returns today for her multiple problems. She denies any angina, palpitation, SOB, syncope, falls, edema. She has not checked her blood pressure and in fact is not sure she is taking her losarten. Because of some right knee aching, Dr Clent Ridges stopped her Lipitor. Her knee discomfort did not improve but she start back on her statin. Her blood pressure is up today. She denies any bleeding or melena. She will have a surveillance colonoscopy with Dr Arlyce Dice soon.    Review of Systems  All other systems reviewed and are negative.      Physical Exam  Nursing note and vitals reviewed. Constitutional: She appears well-developed and well-nourished.  HENT:  Head: Normocephalic and atraumatic.  Eyes: EOM are normal.  Neck: No JVD present. No tracheal deviation present. No thyromegaly present.       Soft right bruit  Cardiovascular: Normal rate, regular rhythm, normal heart sounds and intact distal pulses.  Exam reveals no gallop.   No murmur heard. Pulmonary/Chest: Effort normal and breath sounds normal. She has no wheezes. She has no rales.  Abdominal: Soft. Bowel sounds are normal. There is no tenderness.  Musculoskeletal: She exhibits no edema.  Neurological: She is alert.  Skin: Skin is warm and dry.  Psychiatric: She has a normal mood and affect.

## 2011-01-13 ENCOUNTER — Telehealth: Payer: Self-pay | Admitting: Cardiology

## 2011-01-13 DIAGNOSIS — I10 Essential (primary) hypertension: Secondary | ICD-10-CM

## 2011-01-13 MED ORDER — AMLODIPINE BESYLATE 10 MG PO TABS: 10.0000 mg | ORAL_TABLET | Freq: Every day | ORAL | Status: DC

## 2011-01-13 NOTE — Telephone Encounter (Signed)
Pt daughter calls today to update medication list. Pt is taking her Losartan and she has also increased her Amlodipine as per instruction at last office visit. New prescription for Amlodipine called in  [facility]. Mylo Red RN

## 2011-01-20 ENCOUNTER — Ambulatory Visit (INDEPENDENT_AMBULATORY_CARE_PROVIDER_SITE_OTHER): Payer: Medicare Other | Admitting: *Deleted

## 2011-01-20 DIAGNOSIS — I4891 Unspecified atrial fibrillation: Secondary | ICD-10-CM

## 2011-01-20 LAB — POCT INR: INR: 3

## 2011-02-13 ENCOUNTER — Telehealth: Payer: Self-pay | Admitting: Family Medicine

## 2011-02-13 MED ORDER — OXYCODONE-ACETAMINOPHEN 5-325 MG PO TABS: 1.0000 | ORAL_TABLET | ORAL | Status: DC | PRN

## 2011-02-13 NOTE — Telephone Encounter (Signed)
done

## 2011-02-13 NOTE — Telephone Encounter (Signed)
Pt request for refill of Oxycodone 5-325 please advise

## 2011-02-13 NOTE — Telephone Encounter (Signed)
Requesting rx refill oxycodone (dosage taking not provided) will run out of med next week.

## 2011-02-13 NOTE — Telephone Encounter (Signed)
Pt is aware may take up to 72 hours before rx is ready for pick up

## 2011-02-17 NOTE — Discharge Summary (Signed)
[name], [name]              ACCOUNT NO.:  [id number]   MEDICAL RECORD NO.:  [id number]          PATIENT TYPE:  INP   LOCATION:  3041                         FACILITY:  [facility]   PHYSICIAN:  Pramod P. Pearlean Brownie, MD    DATE OF BIRTH:  02-16-1926   DATE OF ADMISSION:  03/27/2007  DATE OF DISCHARGE:  03/30/2007                               DISCHARGE SUMMARY   ADMISSION DIAGNOSIS:  Stroke.   DISCHARGE DIAGNOSES:  1. 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  2. Hypertension.  3. Hyperlipidemia.  4. Questionable atrial fibrillation not confirmed by cardiology.   HOSPITAL COURSE:  [name] is a 80-year pleasant lady who was  admitted with sudden onset of left-sided weakness, hemineglect and  homonymous hemianopsia.  NIH stroke scale was 15 on admission.  She was  presented within three hours of onset of symptoms and qualified for and  was given full dose IV TPA.  She unfortunately did not show significant  improvement after TPA administration.  She was kept in the intensive  care unit and vital signs and blood pressure was closely monitored.  She  continued to have right-sided weakness with facial weakness and some  extension.  She did show some improvement the next day and NIH score was  only 2.  Subsequently, she had an MRI scan of the brain which confirmed  the right MCA branch infarct.  She had a EKG strip on admission which  raised concern for paroxysmal atrial fibrillation, hence cardiology  services were consulted.  Dr. Valera Castle saw the patient and after his  review of the EKG, he was not convinced that it was suggestive of atrial  fibrillation.  The patient was started on heparin and Coumadin for  presumed cardiac source of embolism and subsequently underwent a  transesophageal echocardiogram which actually did not show any clot in  the heart, patent foramen ovale or definite cardiac source of embolism.  Hence, heparin and Coumadin was discontinued and she was switched on  Aggrenox for secondary stroke prevention.  The patient showed  improvement during the hospital stay.  She did not have any dysarthria  or any weakness, left-sided strength improved quite dramatically.  At  the time of discharge she had no focal deficits remaining.  She was seen  by physical, occupational and speech therapy and was felt safe to be  discharged home.  She was advised to have outpatient transcranial  Doppler emboli monitoring and bubble study performed in Dr. Marlis Edelson  office in a few weeks as well as outpatient CardioNet monitor placed in  Dr. Vern Claude office for monitoring for paroxysmal atrial fibrillation.  The patient was discharged home in a stable condition.   DISCHARGE MEDICATIONS:  1. Aggrenox once a day for 2 weeks and then to be increased to twice a      day.  2. Cardizem CD 240 mg a day.  3. Atacand 32 mg a day.  4. Lasix 20 mg a day.  5. Atenolol 50 mg a day.  6. Synthroid 50 mcg  a day.  7. Caltrate 600 plus vitamin D once a day.  8. Allopurinol 300 once a daily.  9. Lipitor 40 mg a day.  10.Multivitamin once a day.  11.Aspirin 81 mg a day for 2 weeks and then to be stopped.  12.Tylenol 2 tablets half an hour prior to Aggrenox and every six      hourly if needed.   The patient was advised to follow-up with Dr. Pearlean Brownie in his office in a  few weeks for PCD emboli monitoring and stroke follow-up.           ______________________________  Sunny Schlein. Pearlean Brownie, MD     PPS/MEDQ  D:  04/21/2007  T:  04/22/2007  Job:  [id number]

## 2011-02-17 NOTE — Assessment & Plan Note (Signed)
[facility]                            CARDIOLOGY OFFICE NOTE   NAME:COVINGTONRuvi, Fullenwider                     MRN:          [id number]  DATE:07/28/2007                            DOB:          Jun 21, 1926    Ms. Strehlow returns today for further management of the following  issues.   1. Paroxysmal atrial fibrillation, status post embolic CVA.  Now fully      recovered.  2. Normal stress nuclear study with no evidence of obstructive      coronary disease.  3. Normal left ventricular systolic function with mild LVH by 2D echo,      March 28, 2007.  4. Hypertension.  5. Hyperlipidemia, on a statin.  6. Low HDL.   CURRENT MEDICATIONS:  Her current meds are unchanged since her last  visit.  Refer to the maintenance medication list.   She is getting out and starting to exercise.  She has a lot more energy  and less fatigue.  We had decreased her Diltiazem last visit to 180 mg a  day from 240.   She is due a visit with her dermatologist for possible lesion removal.  She has one on her face that she wants taken off.  She has a history of  melanoma and also what sounds like basal cell of her right ear.  I have  told her to contact us before she stops her Coumadin.   EXAM:  Her blood pressure was 118/76, her pulse 74 and regular, weight  is 122.  HEENT:  Normocephalic, atraumatic.  PERRLA.  Extraocular movements  intact.  Sclerae are clear.  Facial symmetry is normal.  She is very  vivacious, as always.  Carotid upstrokes are equal bilaterally, without bruits.  No JVD.  Thyroid is not enlarged.  Trachea is midline.  LUNGS:  Clear.  HEART:  Reveals a normal S1, S2, regular rate and rhythm.  She is in  normal sinus rhythm with a PVC by EKG.  ABDOMINAL EXAM:  Soft with good bowel sounds.  EXTREMITIES:  Reveal no edema.  Pulses are intact.   I am delighted that Ms. Altieri is remaining in sinus rhythm.  She is  feeling better with the lower dose of  Diltiazem.  Before she has any  surgery done, we need to coordinate her  Coumadin.  She is at high risk for recurrent stroke and will need  Lovenox overlap.  I will see her back again in three months.     Thomas C. Daleen Squibb, MD, [facility]  Electronically Signed    TCW/MedQ  DD: 07/28/2007  DT: 07/29/2007  Job #: [id number]   cc:   Ria Bush. Jorja Loa, M.D.  Pramod P. Pearlean Brownie, MD

## 2011-02-17 NOTE — Assessment & Plan Note (Signed)
[facility]                            CARDIOLOGY OFFICE NOTE   NAME:COVINGTONHaset, [name]                     MRN:          [id number]  DATE:05/03/2007                            DOB:          04/07/1926    [name] returns today after we made a diagnosis on the CardioNet  monitor of atrial fibrillation.  This had not shown up in the hospital  nor on followup in the office.  However, subsequent scans have  delineated atrial fibrillation of brief duration.  Before she had had  multiple PACs.   She is at high risk with atrial fibrillation for having another stroke.  Her CHAD2 score is high.   I have recommended Coumadin.  She remains on Aggrenox b.i.d.  I will  discuss with Dr. Pearlean Brownie whether he wants to continue Aggrenox or the  duration of overlap.   With her multiple risk factors and the fact she is so active, I want to  rule out occult coronary disease.  She is scheduled for an exercise rest  stress Myoview tomorrow, off Atenolol.   I spent 30 minutes with her and her daughters, answering all their  questions.  She is comfortable at this point with starting Coumadin.  She will be enrolled in the Coumadin clinic with close followup.  I will  plan on seeing her back in about four to six weeks.     Thomas C. Daleen Squibb, MD, [facility]  Electronically Signed    TCW/MedQ  DD: 05/03/2007  DT: 05/04/2007  Job #: [id number]   cc:   Pramod P. Pearlean Brownie, MD  Tera Mater Clent Ridges, MD

## 2011-02-17 NOTE — Op Note (Signed)
[name], [name]              ACCOUNT NO.:  [id number]   MEDICAL RECORD NO.:  [id number]          PATIENT TYPE:  INP   LOCATION:  3041                         FACILITY:  [facility]   PHYSICIAN:  Noralyn Pick. Eden Emms, MD, FACCDATE OF BIRTH:  [birth year]   DATE OF PROCEDURE:  03/29/2007  DATE OF DISCHARGE:                               OPERATIVE REPORT   PROCEDURE:  Transesophageal echo.   INDICATIONS:  CVA, rule out source of embolus.   DESCRIPTION OF PROCEDURE:  The patient was sedated with 25 mcg of  Fentanyl and 2 mg of Versed.  Using digital technique, an Omniplane  probe was advanced into the distal esophagus without incident.   Transgastric imaging revealed normal LV function with no wall motion  abnormalities.  No apical thrombus.  EF was 60%.  Mitral valve was  mildly thickened with mild MR.  There was mild left atrial enlargement.  Atrial septum was intact both by 2-D and color-flow.  Right-sided  cardiac chambers were normal.  Bubble study was negative for right-to-  left shunt.  Left atrial appendage was well visualized, had normal  contractions; and no spontaneous contrast or thrombus.  Aortic valve was  trileaflet and structurally normal with trivial AI.  The aortic root was  normal.  Imaging of the aorta showed no significant debris.   FINAL IMPRESSION:  1. No source of embolus.  2. Normal left ventricle.  EF 60%.  3. Mild MR.  4. Normal aortic valve with trivial AI.  5. Normal right-sided cardiac chambers.  6. Negative bubble study.  7. No [place], ASD, or VSD.  8. No left atrial appendage thrombus.  9. No aortic debris.   The patient tolerated the procedure well.      Noralyn Pick. Eden Emms, MD, [facility]  Electronically Signed     PCN/MEDQ  D:  03/29/2007  T:  03/30/2007  Job:  [id number]   cc:   Echo Lab

## 2011-02-17 NOTE — Assessment & Plan Note (Signed)
[facility]                            CARDIOLOGY OFFICE NOTE   NAME:COVINGTONSincere, Berlanga                     MRN:          [id number]  DATE:10/11/2007                            DOB:          [birth year]    Ms. Braud returns today.   PROBLEM LIST:  1. Paroxysmal atrial fibrillation.  She maintains in sinus rhythm.      She has no complaints of tachy palpitations, shortness of breath or      chest pain.  2. Status post embolic CVA.  Now fully recovered.  3. Anticoagulation.  We recently approved Coumadin to warfarin switch.      Her INRs have been hard to adjust.  4. Hypertension.  5. Hyperlipidemia currently on a statin with Dr. Clent Ridges.  6. Low HDL.  7. Normal left ventricular function with mild LVH echo June 2008.   Her medications are really unchanged since her last visit.  We did  switch her Coumadin to warfarin as well as generic levothyroxine to save  some money in the doughnut hole.  She wants to know if she can change  the other medications.   EXAM:  Her blood pressure is 140/78.  It usually runs very good.  Pulse  56 and regular.  Weight is 123.  She is her usual vivacious positive outlook.  Alert and oriented x3.  HEENT:  Unchanged.  Carotids are full with bilateral soft systolic sounds.  Thyroid is not  enlarged.  Trachea is midline.  Lungs are clear.  HEART:  Reveals a regular rate and rhythm with no S4.  Abdominal exam is soft, good bowel sounds.  EXTREMITIES:  Reveal no cyanosis, clubbing or edema.   ASSESSMENT:  Ms. Caprara is doing well.  I told her to talk to Dr. Clent Ridges  on her upcoming physical.  She can change Atacand to an ACE inhibitor  such as lisinopril 40 mg a day.  I did talk about of a nonproductive  cough associated with this.  She will discuss this with him.  I have  made no other changes or suggestions.  I will plan on see her back in 6  months.  She quit smoking in 1996.   ADDENDUM:  Please note that she does have  nonobstructive cerebrovascular  disease on carotid Dopplers June, 2008.  She had a 68% right internal  carotid stenosis and a 46% left internal carotid stenosis.  Her  vertebral artery flow was antegrade bilaterally.     Thomas C. Daleen Squibb, MD, [facility]  Electronically Signed    TCW/MedQ  DD: 10/11/2007  DT: 10/11/2007  Job #: [id number]   cc:   Jeannett Senior A. Clent Ridges, MD

## 2011-02-17 NOTE — Assessment & Plan Note (Signed)
[facility]                            CARDIOLOGY OFFICE NOTE   NAME:[name], [name]                     MRN:          [id number]  DATE:04/22/2007                            DOB:          Apr 29, 1926    [name] returns today after being discharged from the hospital.  She presented with a right middle cerebral artery branch embolic  infarct.   We were asked to see her because of the question of some atrial  fibrillation.  We reviewed all of the strips from telemetry and found  nothing that was convincing for atrial fibrillation.  She did have some  PACs.   She underwent a transesophageal echocardiogram, which showed no left  atrial thrombus, or appendage thrombus.  There was no other cardiogenic  source of emboli noted.  She had normal left ventricular function.  No  shunt was noted.   She has multiple risk factors for stroke and heart disease, including  hypertension, hyperlipidemia with a low HDL, age, and family history.   She has gotten along really well since she was discharged.  She is  wearing a CardioNet monitor, which she has had difficulty with.  She was  placed with a new monitor because of problems with cell phone coverage.  The 2 strips we have show sinus brady with PACs and PVCs.  There is no  atrial fibrillation.  She has had no symptoms of atrial fibrillation.   CURRENT MEDICATIONS:  1. Atacand 32 mg a day.  2. Diltiazem Extended Release 40/240 daily.  3. Furosemide 20 mg a day.  4. Atenolol 50 mg daily.  5. Allopurinol 300 mg a day.  6. Multivitamin daily.  7. Vitamin B6 daily.  8. Lipitor 40 mg a day.  9. Synthroid 0.05 mg daily.  10.Caltrate 600 Plus Vitamin D.  11.Aggrenox b.i.d.  12.Tylenol p.r.n.   She is having no symptoms of angina or ischemia.  She is extremely  active with her walking program.   EXAM:  Her blood pressure was mildly elevated at 148/78, her pulse is 60  and regular.  Her weight is 122.  HEENT:  Normocephalic, atraumatic.  PERRLA.  Extraocular muscles are  intact.  Sclerae clear.  Facial symmetry is normal.  NEUROLOGIC:  Exam is grossly intact.  She is very alert and oriented.  Very bright.  Carotid upstrokes were equal bilaterally with soft systolic sound in the  right and the left.  LUNGS:  Clear.  HEART:  Reveals a nondisplaced PMI.  She has normal S1, S2 without  gallop.  ABDOMEN:  Soft with good bowel sounds.  No midline bruit.  There is no  hepatomegaly.  EXTREMITIES:  No cyanosis, clubbing, or edema.  Pulses are brisk.  SKIN:  Shows a few ecchymoses.   I also found carotid Dopplers that were done in the hospital.  She had  moderate irregularly calcified plaque in the proximal segment of the  internal right coronary artery.  She had very localized mild soft smooth  plaque in the proximal segment of the left internal carotid artery.  She  had antegrade flow  in both vertebrals.   ASSESSMENT:  1. Status post right middle cerebral artery branch embolic infarct.  I      suspect this came from her right internal carotid artery.  I have      talked with her and her family at length about this today.      Secondary risk factor modification is our best defense.  2. No obvious cardiogenic embolic source of stroke.  3. Hypertension.  4. Hyperlipidemia with a low HDL.   Her last LDL in the hospital was 67.   RECOMMENDATIONS:  1. Wear the CardioNet 1 more week.  If no events, turn it in.  2. Continue current medications.  3. Have Dr. Clent Ridges consider increasing the Lipitor to 80 mg daily to push      her LDL down even further.  However, she is below 70, so no data      would support pushing it further at this point.   Once she recovers from her infarct and is doing well, an exercise rest-  stress Myoview might be useful, though she is totally asymptomatic.  Her  family and I talked at length today about the fact that she has problems  with blood vessels in her neck.   She may have them in her coronaries.  She has no sign of PAD.   I will plan on seeing her back again on a p.r.n. basis.     Thomas C. Daleen Squibb, MD, [facility]  Electronically Signed    TCW/MedQ  DD: 04/22/2007  DT: 04/23/2007  Job #: [id number]   cc:   Jeannett Senior A. Clent Ridges, MD

## 2011-02-17 NOTE — Assessment & Plan Note (Signed)
[facility]                            CARDIOLOGY OFFICE NOTE   NAME:COVINGTONMiesha, Bachmann                     MRN:          [id number]  DATE:04/26/2008                            DOB:          22-Sep-1926    Ms. Birmingham comes in today for further management and followup.   PROBLEM LIST:  1. Paroxysmal atrial fibrillation.  She continues to stay in sinus      rhythm.  She has had no pain or palpitations or shortness of      breath.  2. Status post embolic cerebrovascular accident secondary to      paroxysmal atrial fibrillation.  3. Anticoagulation, followed here in the [facility].  4. Hypertension, which is fairly labile.  5. Hyperlipidemia, followed by Dr. Clent Ridges.  6. Low HDL.  7. Normal left ventricular function and mild left ventricular      hypertrophy.   She has been having a lot of back pain and really wanted to have an  epidural injection.  We were concerned about her coming off Coumadin and  so she declined.  I have told her that we can probably do it since she  is staying in sinus rhythm and the risk will be reasonable.  She still  would like not to do that.   Her meds are:  1. Atacand 32 mg a day.  2. Furosemide 20 mg a day.  3. Atenolol 50 mg a day.  4. Allopurinol 300 mg a day.  5. Multivitamins and B6.  6. PreserVision 2 daily.  7. GenTeal eye drops.  8. Lipitor 40 mg a day.  9. Caltrate D.  10.Coumadin as directed.  11.Diltiazem 180 daily.  12.Levothyroxine 50 mcg a day.  13.Vitamin D.   PHYSICAL EXAMINATION:  VITAL SIGNS:  Her blood pressure today was 135/78  with a large cuff in the left arm.  Her heart rate is 55 and regular.  She is in sinus bradycardia.  Her weight is 113.  HEENT:  Normocephalic and atraumatic.  PERRLA.  Extraocular movements  are intact.  Sclerae are clear.  Arcus senilis.  NECK:  Carotid upstrokes are equal bilaterally without bruits.  No JVD.  Thyroid is not enlarged.  Trachea is midline.  Neck  is supple.  LUNGS:  Clear.  HEART:  Nondisplaced PMI.  She has normal S1 and S2.  ABDOMEN:  Soft.  Good bowel sounds.  No midline bruits.  EXTREMITIES:  No cyanosis, clubbing, or edema.  Venous varicosities are  present.  There is no sign of DVT.  Pulses are present.  NEUROLOGIC:  Grossly intact.   Ms. Haselton is doing remarkably well.  Her EKG shows sinus brady with  no changes.  We will continue with her current medical program.  I will  plan on seeing her back again in 6 months.     Thomas C. Daleen Squibb, MD, [facility]  Electronically Signed    TCW/MedQ  DD: 04/26/2008  DT: 04/27/2008  Job #: [id number]

## 2011-02-17 NOTE — Assessment & Plan Note (Signed)
[facility]                            CARDIOLOGY OFFICE NOTE   NAME:COVINGTONAnvi, Mangal                     MRN:          [id number]  DATE:11/13/2008                            DOB:          11/29/1925    Ms. Holtrop comes in today for followup.  We saw her last on April 26, 2008.  Please refer to the problem list there.   Her appetite is just poor and she has lost an additional 7 pounds.  She  is down to 106.   She says she stays cold all the time, blames this on Coumadin.   She has had no symptoms of TIAs or recurrent stroke.  She is very  compliant with her meds though her Coumadin has been very difficult to  control.   Her medications are outlined in my previous note.   PHYSICAL EXAMINATION:  GENERAL:  She is delightful as usual.  She is  alert and oriented x3.  VITAL SIGNS:  Her blood pressure 156/72, her pulse is 52 and regular.  Her weight is 106.  HEENT:  Unchanged.  NECK:  Carotids are full without bruits.  No JVD.  Thyroid is not  enlarged.  Trachea is midline.  Neck is supple.  CHEST:  Lungs are clear to auscultation, percussion.  HEART:  Nondisplaced PMI.  Normal S1, S2.  ABDOMEN:  Soft.  Good bowel sounds.  No midline bruit.  EXTREMITIES:  No cyanosis, clubbing, or edema.  Pulses are intact.  Venous varicosities are present.  There is no sign of DVT.   Ms. Coombes is doing well from our standpoint.  I have made no changes  in the medical program.  I have encouraged her to eat better.  We will  see her back in 6 months.     Thomas C. Daleen Squibb, MD, [facility]  Electronically Signed    TCW/MedQ  DD: 11/13/2008  DT: 11/14/2008  Job #: [id number]

## 2011-02-17 NOTE — Assessment & Plan Note (Signed)
[facility]                            CARDIOLOGY OFFICE NOTE   NAME:[name], [name]                     MRN:          [id number]  DATE:10/11/2007                            DOB:          Sep 29, 1926    ADDENDUM:  Please note that she does have nonobstructive cerebrovascular  disease on carotid Dopplers June, 2008.  She had a 68% right internal  carotid stenosis and a 46% left internal carotid stenosis.  Her  vertebral artery flow was antegrade bilaterally.     Thomas C. Daleen Squibb, MD, [facility]     TCW/MedQ  DD: 10/11/2007  DT: 10/11/2007  Job #: [id number]

## 2011-02-17 NOTE — Consult Note (Signed)
NAMEALECIA, DOI              ACCOUNT NO.:  [id number]   MEDICAL RECORD NO.:  [id number]          PATIENT TYPE:  INP   LOCATION:  3107                         FACILITY:  [facility]   PHYSICIAN:  Jesse Sans. Wall, MD, FACCDATE OF BIRTH:  May 05, 1926   DATE OF CONSULTATION:  03/28/2007  DATE OF DISCHARGE:                                 CONSULTATION   PRIMARY CARE PHYSICIAN:  Dr. Foy Guadalajara.   PRIMARY CARDIOLOGIST:  Dr. Daleen Squibb.   CHIEF COMPLAINT:  Questionable atrial fibrillation.   HISTORY OF PRESENT ILLNESS:  Ms. Steven is a 77 year old white female  with no history of coronary artery disease or arrhythmia.  She came in  last p.m. with a code stroke and received TPA.  There was concern for  atrial fibrillation by EKG, and cardiology was asked to evaluate her.   Ms. Stallworth has no history of chest pain.  She had palpitations once  years ago which were brief and asymptomatic.  That is the only time she  remembers feeling her heart beating abnormally at all.  Currently on  telemetry she has occasional PACs as well as junctional and ventricular  premature contractions, but she is unaware of these.  She exercises  daily, eats a heart healthy diet, and is compliant with her medications  prior to admission which included Diltiazem 240 mg daily as well as  atenolol 50 mg daily.  She quit tobacco approximately 10 years ago.  Her  general state of health is quite good.  Last p.m. before the sudden  onset of the left hemiparesis she had no palpitations, no presyncope, no  syncope, and no chest pain.   PAST MEDICAL HISTORY:  She has a history of hypertension and  hyperlipidemia as well as remote history of tobacco use.  She has a  history of hypothyroidism.  She has a history of hemorrhoids as well as  diverticulosis and possible irritable bowel syndrome.  She has  cataracts.  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  She has  osteoarthritis and gout.   PAST SURGICAL HISTORY:  She is  status post hysterectomy as well as  colonoscopy and flexible sigmoidoscopy.  She has had a tonsillectomy and  a right ear skin cancer removed with a resultant skin graft.   SOCIAL HISTORY:  She lives alone in [place] with her three daughters  living nearby.  She is a retired Secondary school teacher for [employer].  She has approximately 50 pack-year history of tobacco use, but quit in  1996.  She does not and never has abused alcohol or drugs.  She feels  that she eats a heart healthy diet and exercises daily.   ALLERGIES:  She is allergic or intolerant to TEGASEROD, AMOXICILLIN,  VICODIN, and MACROBID.   CURRENT MEDICATIONS:  1. Coumadin from pharmacy.  2, Protonix 40 mg IV daily.  1. Cipro 500 mg b.i.d. for seven days for possible UTI.  2. p.r.n. labetalol and nicardipine for hypertension (not given      recently).   FAMILY HISTORY:  Her mother lived to age 23 and died of old age  with a  history of hypertension but no history of CAD.  Her father died in his  11s of Alzheimer's disease, but did have a prior CVA.  She has one  sister, and that sister is in good health.   REVIEW OF SYSTEMS:  She has some vision loss because of the cataracts.  She has some arthralgias, but does not take frequent medication for  them.  She has had bright red blood per rectum in the past and had  hemorrhoid banding about a month ago.  She does report cold intolerance.  Review of Systems is, otherwise, negative.   PHYSICAL EXAMINATION:  VITAL SIGNS:  Temperature is 98.2, blood pressure  130/97, pulse 68, respiratory rate 28, O2 saturation 100% on 2 liters.  GENERAL:  She is a slender elderly white female in no acute distress.  Her speech is slightly slurred.  HEENT:  Normal except for the facial drooping and slightly slurred  speech.  NECK:  There is no lymphadenopathy, thyromegaly or JVD noted.  A soft  right carotid bruit.  CVA:  Her heart is slightly irregular in rate and rhythm, but basically   regular with no significant murmur, rub or gallop noted.  CHEST:  She has some rales in the bases, and a chest x-ray will be  ordered.  SKIN:  No rashes or lesions are noted.  ABDOMEN:  Soft and nontender with active bowel sounds and no  hepatosplenomegaly by palpation.  EXTREMITIES:  There is no cyanosis, clubbing or edema noted.  MUSCULOSKELETAL:  There is no joint deformity or effusions and no spine  or CVA tenderness.  NEUROLOGICAL:  She is alert and oriented x3.  There is a slight droop to  the left side of her face but no obvious extremity weakness.   EKG:  1. Sinus rhythm.  2. Was read as atrial fibrillation, but is actually sinus rhythm with      variable atrial ectopy.   LABORATORY VALUES:  Hemoglobin 12.1, hematocrit 37.2, WBC 7.5, platelets  244.  Sodium 138, potassium 4, chloride 106, CO2 26, BUN 18, creatinine  0.93, glucose 113, total cholesterol 105.  Triglycerides 115, HDL 31,  LDL 51.  INR 1, PTT 30.  CK-MB and troponin negative x1 and point of  care markers negative x1.   IMPRESSION:  1. Cerebrovascular accident:  She is status post cerebrovascular      accident with significant improvement.  She is being well-managed      by the stroke team.  2. Questionable paroxysmal atrial fibrillation:  She has had no      significant bradycardia, no pauses, and no significant tachycardia.      Prior to admission she had no arrhythmia or ischemia-related      symptoms.  Her EKG and rhythm strips were reviewed and although she      has an irregular heart rate at times, her underlying rhythm is      focal with multiple atrial ectopic beats.  An echocardiogram has      been performed, and the results will be reviewed.  A bubble study      was done at the same time to evaluate for patent foramen ovale.  If      her echocardiogram under telemetry remains normal, we would not      recommend a transesophageal echocardiogram at this time.  Agree to     checking carotid ultrasounds  as you have ordered and      anticoagulation  with heparin as a transition to Coumadin.  Prior to      admission she was on verapamil and atenolol for      hypertension, and we will leave it to the neurologist to resume her      prior blood pressure medications.  With some rales in her bases      will also order a chest x-ray.   Dr. Juanito Doom saw the patient and determined the plan of care.      Theodore Demark, PA-C      Jesse Sans. Daleen Squibb, MD, [facility]  Electronically Signed    RB/MEDQ  D:  03/28/2007  T:  03/28/2007  Job:  [id number]   cc:   Molly Maduro L. Foy Guadalajara, M.D.

## 2011-02-17 NOTE — Assessment & Plan Note (Signed)
[facility]                            CARDIOLOGY OFFICE NOTE   NAME:[name], Deshpande                     MRN:          [id number]  DATE:06/16/2007                            DOB:          02-02-1926    Ms. [name] comes in today with all 3 of her daughters with multiple  questions about her atrial fib, lifestyle, exercise, and just being  extremely anxious in general about having another stroke.   PROBLEM LIST:  1. Paroxysmal atrial fibrillation.  2. Status post embolic CVA.  3. Normal stress nuclear study with no evidence of obstructive      coronary artery disease.  4. Normal left ventricular systolic function with mild LVH by 2D echo      March 28, 2007.  5. Hypertension.  6. Hyperlipidemia, now on a Statin.  7. Low HDL.   CURRENT MEDICATIONS:  1. Atacand 32 mg a day.  2. Diltiazem extended release 240 daily.  3. Furosemide 20 mg a day.  4. Atenolol 50 mg a day.  5. Allopurinol 300 mg a day.  6. Multivitamin.  7. Vitamin B6.  8. Lipitor 40 mg a day.  9. Synthroid 0.05 mg per day.  10.Caltrate and D.  11.Coumadin as directed.   PHYSICAL EXAMINATION:  VITAL SIGNS:  Blood pressure 126/72.  Her pulse  is 47 and she is in sinus brady by EKG.  Her weight is 121.  HEENT:  Normocephalic atraumatic.  PERRLA.  Extraocular movements  intact.  Sclerae are clear.  Facial symmetry is normal.  NECK:  Carotids  upstrokes are equal bilaterally without bruits.  No JVD.  Thyroid is not  enlarged.  Trachea is midline.  LUNGS:  Clear.  HEART:  Reveals a regular rate and rhythm that is slow.  ABDOMEN:  Soft.  Good bowel sounds.  EXTREMITIES:  Reveal no edema.  Pulses are intact.  NEUROLOGIC:  Intact.   I have reviewed 2 emergency room visits, one for a bleeding a little bit  from Lovenox injection site as well as just one that occurred with  generalized weakness.  No diagnoses were made.  She clearly did not have  another stroke.   She was mildly  anemic with a hemoglobin of 11.1.  The rest of her blood  work was unremarkable, except for a potassium of 3.4 on one occasion.   She is due to follow up with Dr. Pearlean Brownie for a carotid Doppler in the next  week or two.  She is also anxious to have her TSH checked since her  Synthroid was adjusted.  We can check that for her today.   Her family and her had probably 20 questions, all of which were  answered.  I will not go into details.  I basically told her to try to  get on with her life, to begin exercising on a regular basis, and know  that she has no control over this.  I did decrease her Diltiazem dose to  180 mg a day to increase her resting heart rate.  Perhaps this is  responsible for some of her  generalized fatigue.   I will plan on seeing her back in about 4-6 weeks.  Again, we will check  a TSH today.     Thomas C. Daleen Squibb, MD, [facility]  Electronically Signed    TCW/MedQ  DD: 06/16/2007  DT: 06/16/2007  Job #: [id number]   cc:   Delia Heady, MD

## 2011-02-17 NOTE — H&P (Signed)
[name], [name]              ACCOUNT NO.:  [id number]   MEDICAL RECORD NO.:  [id number]          PATIENT TYPE:  INP   LOCATION:  1831                         FACILITY:  [facility]   PHYSICIAN:  Deanna Artis. Hickling, M.D.DATE OF BIRTH:  [birth year]   DATE OF ADMISSION:  03/27/2007  DATE OF DISCHARGE:                              HISTORY & PHYSICAL   DICTATED BY:  Suzzette Righter, D.O. on behalf of Dr. Ellison Carwin.   CHIEF COMPLAINT:  Acute onset left hemiparesis.   HISTORY OF PRESENT ILLNESS:  The patient is a 77 year old Caucasian  female seen initially in neurological consultation in the emergency  department in [facility] at approximately 2300 hours.  The  patient had gone out to dinner with her family and had returned to her  family's home when she was witnessed to have acute onset left  hemiparesis at approximately 2145.  At that time the patient's step-  daughter called EMS and EMS arrived at the home within five minutes.  The patient was transported emergently to [facility].  In the  emergency department the patient obtained a STAT non-contrasted CT which  was read out as no acute intracranial process.  The patient's first  documented vital signs were 126/79, heart rate of 64, respiratory rate  at 18, O2 saturation of 100% on room air.  Neurology was contacted via  the stroke code pager and asked to assess the patient for potential TPA  administration.   PAST MEDICAL HISTORY:  The past medical history is significant for  hypertension, gout, hypothyroidism, irritable bowel syndrome,  degenerative joint disease, gout, diverticulosis, osteoarthritis,  internal hemorrhoids, melanoma of skin on back, and hyperlipidemia.   PAST SURGICAL HISTORY:  The past surgical history includes a  hysterectomy, colonoscopy, and band ligation of the patient's previous  internal hemorrhoids.   ALLERGIES:  Allergies are listed as Macrobid, Vicodin, Zelnorm,  amoxicillin, and  penicillin.   SOCIAL HISTORY:  The patient is independent of activities of daily  living.  She is a nonsmoker.  She drinks alcohol on rare occasions.  She  is independent of activities of daily living and does not use any  assistive devices for ambulation.  The patient is an avid walker and is  physically very active.   FAMILY HISTORY:  The family history is unknown at this time.   REVIEW OF SYSTEMS:  At the present time, the patient denies chest pain  or shortness of breath.  She does report weakness on the left side of  her body.  She denies headache or vision disturbance.  The patient  denies abdominal pain, nausea, or emesis.   PHYSICAL EXAMINATION:  GENERAL:  The patient is awake and alert.  She  does not appear to be in any acute distress.  She is conversant.  VITAL SIGNS:  Temperature of 97.3, blood pressure of 126/79, heart rate  of 64, respiratory rate of 18, oxygen saturation of 100% on room air.  Weight of 122 pounds.  HEENT:  The patient is normocephalic and atraumatic.  The oral mucosa is  moist.  There are no  tongue lacerations present.  No carotid bruits were  auscultated.  HEART:  The heart is regular rate.  Upon reassessment the patient did  develop paroxysmal atrial fibrillation during the emergency department  stay.  No murmurs were identified.  LUNGS:  The lungs are clear to auscultation bilaterally.  There are no  rales, rhonchi, or wheezing noted.  ABDOMEN:  The abdomen is soft, nontender, nondistended.  EXTREMITIES:  The extremities are without any edema or cyanosis.  NEUROLOGICAL:  On the neurological examination, an NIH Stroke Scale was  completed and the patient's NIH Stroke Scale was 15.  The patient was  noted to have a modified Rankin score of 4 given the fact that she is  not ambulatory without any assistive devices.  Mental status:  The  patient is awake and alert.  She is able to state her name, date, day,  president, all accurately.  She follows  commands appropriately.  There  is no anomia noted.  She participates with the examination.  Minimal  dysarthria is identified.  Cranial nerve examination:  The pupils are  reactive to light bilaterally.  Extraocular muscle movements are intact  bilaterally.  Initially the patient does have a right sided gaze  preference which does eventually overcome with command.  There is no  disconjugate gaze, however, noted.  Extraocular muscle movements are  intact bilaterally.  There is no nystagmus.  Facial sensation is  symmetrically intact to pin stimulation.  The patient is with prominent  left-sided facial droop, no gross hearing deficits are noted, palate  elevated symmetrical.  Sternocleidomastoid and trapezius strengths are  full.  The tongue protrudes midline without any lacerations.  Motor  examination:  There is no resting or action tremor.  Grip, elbow  flexion, elbow extension, shoulder abduction, hip flexion, ankle plantar  flexion, and ankle dorsiflexion are 5/5 on the right.  On the left the  patient has 1/5 strength in the proximal musculature of the upper  extremity only, no distal movement is appreciated.  In the lower  extremity the patient is able to momentarily raise her leg to  antegravity but it rapidly falls to the bed.  Distal strength in the  left lower extremity is graded at 5/5 with ankle plantar flexion and  ankle dorsiflexion.  Reflexes:  Biceps, triceps, and brachial radialis  are 2+/4 bilaterally.  Patellar reflexes demonstrate cross adductor  response.  Achilles reflexes are 2/4 bilaterally.  Triple reflexor  response is noted on the left.  Hoffman's response is present on the  left and absent on the right.  Sensory examination:  Patient with hemi-  extinction on the left.  There is diminished light touch as well as pin  sensation on the left side of the body as compared to the right. Cerebellar examination:  Finger to nose is intact on the right.  The  left sided  testing is limited by the patient's weakness.   INVESTIGATIONS:  CT imaging of the brain performed in the emergency  department without IV contrast was dictated as no acute process.  There  was prominent small vessel ischemic changes reported.  The study was  personally reviewed and felt to reflect sulcal effacement in the  distribution of the middle and posterior divisions of the right middle  cerebral artery.  There was no hemorrhage identified.   LABORATORY ASSESSMENTS:  White blood cell count of 7.5, hemoglobin of  12.1, hematocrit of 37.2, and platelet count of 244.  PT of 13.4, INR  of  1, PTT of 30.  Electrolytes are pending at this time.   IMPRESSION:  1. The patient is a 77 year old Caucasian female who was noted to have      acute onset left hemiparesis while at home at 2145.  The patient      was transported to [facility] via EMS.  The patient was      noted to have left hemiparesis with an essentially unremarkable CT      scan of the brain.  Examination was consistent with a right middle      cerebral artery ischemic infarction.  2. Paroxysmal atrial fibrillation identified in the emergency      department.  The patient does not have a documented history of the      same.  This potentially may be a prominent stroke risk factor for      this patient.  3. Additional risk factors include hypertension, hyperlipidemia, and      age.   PLAN:  1. IV TPA given in the emergency department.  2. Admit to 3100 under stroke physician.  3. Repeat CT scan of the brain without contrast in the morning to rule      out hemorrhage.  4. MRI of the brain and MRA of the head and neck to assess for      vascular compromise later tomorrow.  5. Blood pressure control.  6. Lipid profile.  7. Check echocardiogram.  8. SCDs.   ADDENDUM:  Consent was obtained from the patient's Power of Attorney,  namely her daughter, Gardenia Phlegm, here in the emergency department with  the patient's  step sister and additional daughter as witnesses.  The  risks of 6% hemorrhage was explained to the family.  It was felt that it  was in the best interest of the patient to receive TPA.  All questions  were answered to their satisfaction prior to administration of the  medication.      Deanna Artis. Sharene Skeans, M.D.  Electronically Signed     [facility]/MEDQ  D:  03/28/2007  T:  03/28/2007  Job:  [id number]   cc:   Deanna Artis. Sharene Skeans, M.D.

## 2011-02-19 ENCOUNTER — Ambulatory Visit (INDEPENDENT_AMBULATORY_CARE_PROVIDER_SITE_OTHER): Payer: Medicare Other | Admitting: *Deleted

## 2011-02-19 ENCOUNTER — Other Ambulatory Visit (INDEPENDENT_AMBULATORY_CARE_PROVIDER_SITE_OTHER): Payer: Medicare Other | Admitting: *Deleted

## 2011-02-19 DIAGNOSIS — I4891 Unspecified atrial fibrillation: Secondary | ICD-10-CM

## 2011-02-19 DIAGNOSIS — E785 Hyperlipidemia, unspecified: Secondary | ICD-10-CM

## 2011-02-19 LAB — LIPID PANEL
Cholesterol: 109 mg/dL (ref 0–200)
HDL: 35.3 mg/dL — ABNORMAL LOW (ref >39.00)
VLDL: 16 mg/dL (ref 0.0–40.0)

## 2011-02-19 LAB — BASIC METABOLIC PANEL
BUN: 11 mg/dL (ref 6–23)
Calcium: 9.4 mg/dL (ref 8.4–10.5)
GFR: 71.81 mL/min (ref >60.00)
Glucose, Bld: 75 mg/dL (ref 70–99)
Potassium: 5.1 mEq/L (ref 3.5–5.1)
Sodium: 141 mEq/L (ref 135–145)

## 2011-02-19 LAB — HEPATIC FUNCTION PANEL
ALT: 17 U/L (ref 0–35)
AST: 37 U/L (ref 0–37)
Total Bilirubin: 0.4 mg/dL (ref 0.3–1.2)
Total Protein: 7.2 g/dL (ref 6.0–8.3)

## 2011-02-19 LAB — POCT INR: INR: 3

## 2011-02-20 NOTE — Assessment & Plan Note (Signed)
 HEALTHCARE                         GASTROENTEROLOGY OFFICE NOTE   NAME:COVINGTONRabecca, Birge                     MRN:          [id number]  DATE:01/20/2007                            DOB:          1926/10/01    PROBLEM:  Hemorrhoids.   Ms. Hartsfield underwent band ligation of her hemorrhoids approximately a  month ago.  She no longer has pain.  On 1 occasion she had pain with  protrusion of a hemorrhoid following a bowel movements, which she was  easily able to reduce.   EXAMINATION:  Pulse 60.  Blood pressure 122/80.  Weight 124.   IMPRESSION:  Symptomatic internal hemorrhoids.  Significantly improved  following band ligation.   RECOMMENDATIONS:  Anusol HC suppositories as needed.     Barbette Hair. Arlyce Dice, MD,FACG  Electronically Signed    RDK/MedQ  DD: 01/20/2007  DT: 01/20/2007  Job #: [id number]

## 2011-02-20 NOTE — Assessment & Plan Note (Signed)
[facility]                         GASTROENTEROLOGY OFFICE NOTE   NAME:[name], [name]                     MRN:          [id number]  DATE:11/29/2006                            DOB:          22-Dec-1925    PROBLEM:  Hemorrhoids.   HISTORY:  [name] is a pleasant 77 year old white female known to Dr.  Arlyce Dice who had undergone banding of her internal hemorrhoids x5 in July  2006.  She did well with that procedure and said that that helped her  for a long while.  Now over the past month, she has been having  difficulty with what she feels is one hemorrhoid which intermittently  prolapses.  She has been using Sitz baths on a regular basis, has been  using Anusol HC and feels that the Sitz baths are actually more helpful.  She denies any difficulty with constipation, hard stools, etc., and has  had minimal bleeding.  She is not currently on any aspirin or anti-  inflammatories or other blood thinners.   CURRENT MEDICATIONS:  1. Atacand 32 mg daily.  2. Diltiazem XR 240 daily.  3. Furosemide 20 daily.  4. Atenolol 50 daily.  5. Allopurinol 300 daily.  6. Synthroid 0.05 daily.  7. Lipitor 40 daily.  8. Multivitamin daily.  9. Vitamin B6 daily.  10.PreserVision two tablets daily.  11.Genteal eye drops.  12.Advil p.r.n.   ALLERGIES/INTOLERANCES:  TEGASEROD diarrhea.   PAST MEDICAL HISTORY:  1. Diverticulosis.  2. Hypertension.  3. Hypothyroidism.  4. Osteoarthritis.  5. Prior hysterectomy.  6. Hyperlipidemia.   PHYSICAL EXAMINATION:  GENERAL APPEARANCE:  A well-developed, elderly ,  thin white female in no acute distress.  VITAL SIGNS:  Weight 122.6, blood pressure 140/80, pulse in the 70s.  HEENT:  Atraumatic and normocephalic.  EOMI. PERRLA.  Sclerae are  anicteric.  LUNGS:  Clear to auscultation and percussion.  CARDIOVASCULAR:  Regular rate and rhythm with S1 and S2, no murmurs,  rubs, or gallops.  ABDOMEN:  Soft, bowel sounds are active.  She is nontender.  There is no  palpable mass or hepatosplenomegaly.  RECTAL:  Internal hemorrhoids at the 6 o'clock position which is  nonthrombosed and not currently prolapsed, consistent with grade III  internal hemorrhoids.   IMPRESSION:  A 77 year old white female with history of previous banding  of symptomatic internal hemorrhoids, now with one grade III hemorrhoid,  symptomatic.   PLAN:  Schedule flexible sigmoidoscopy with banding, continue Anusol HC  and sitz baths as she is doing.      Mike Gip, PA-C  Electronically Signed      Barbette Hair. Arlyce Dice, MD,FACG  Electronically Signed   AE/MedQ  DD: 12/02/2006  DT: 12/02/2006  Job #: [id number]

## 2011-02-20 NOTE — Assessment & Plan Note (Signed)
[facility] OFFICE NOTE   NAME:[name], [name]                     MRN:          [id number]  DATE:01/03/2007                            DOB:          [birth year]    This is a 77 year old woman here for a complete physical examination.  In general she is doing well and has no complaints except for her usual  joint stiffness and pain.  She does exercise 5-6 days a week.  For  details of her past medical history, family history, social history,  habits, etc., refer to her last physical note dated May 29, 2005.  Of  note she had an unremarkable colonoscopy in February of 2006.  She had a  normal mammogram in October of 2007.  Of note she had another  colonoscopy for the purpose of banding some internal hemorrhoids  performed by Dr. Arlyce Dice about 2 weeks ago.  She continues to see Dr.  Santiago Bumpers on a regular basis for ophthalmologic care.  She does have mild  cataracts and mild macular degeneration, but these appear to be stable.   ALLERGIES:  AMOXICILLIN, VICODIN, ZELNORM, AND MACROBID.   CURRENT MEDICATIONS:  1. Diltiazem 240 mg per day.  2. Atacand 32 mg per day.  3. Furosemide 20 mg per day.  4. Atenolol 50 mg per day.  5. Synthroid 50 mcg per day.  6. Multivitamins daily.  7. Fish oil daily.  8. Allopurinol 300 mg daily.  9. Lipitor 40 mg daily.  10.Several eye drops daily.   OBJECTIVE:  VITAL SIGNS:  Height 5 foot 1 inch, weight 123, blood  pressure 152/82, pulse 64 and regular.  GENERAL:  She appears to be at her baseline.  SKIN:  Clear.  EYES:  Clear, she wears glasses.  EARS:  Clear.  PHARYNX:  Clear.  NECK:  Supple without lymphadenopathy or masses.  LUNGS:  Clear.  CARDIAC:  Rate and rhythm regular without gallops, murmurs, or rubs.  Distal pulses are full.  EKG shows some QT interval prolongation, but otherwise no change from  her baseline.  BREASTS AND AXILLAE:  Clear.  ABDOMEN:  Soft,  normal bowel sounds, nontender, no masses.  EXTREMITIES:  No clubbing, cyanosis, or edema.  NEUROLOGIC:  Grossly intact.   ASSESSMENT AND PLAN:  1. Complete physical.  Will get the usual laboratories today.  2. Hypothyroidism.  We will check a TSH today.  3. Hyperlipidemia.  We will check a fasting lipid panel today.  4. Hypertension, stable.  5. Irritable bowel syndrome, stable.  6. Degenerative joint disease, stable.  7. Gout, stable.     Tera Mater. Clent Ridges, MD  Electronically Signed    SAF/MedQ  DD: 01/03/2007  DT: 01/03/2007  Job #: [id number]

## 2011-03-09 ENCOUNTER — Encounter: Payer: Self-pay | Admitting: Family Medicine

## 2011-03-19 ENCOUNTER — Ambulatory Visit (INDEPENDENT_AMBULATORY_CARE_PROVIDER_SITE_OTHER): Payer: Medicare Other | Admitting: *Deleted

## 2011-03-19 DIAGNOSIS — I4891 Unspecified atrial fibrillation: Secondary | ICD-10-CM

## 2011-03-19 LAB — POCT INR: INR: 2.7

## 2011-03-27 ENCOUNTER — Other Ambulatory Visit: Payer: Self-pay | Admitting: Family Medicine

## 2011-04-10 ENCOUNTER — Encounter: Payer: Self-pay | Admitting: Gastroenterology

## 2011-04-11 ENCOUNTER — Other Ambulatory Visit: Payer: Self-pay | Admitting: Cardiology

## 2011-04-16 ENCOUNTER — Ambulatory Visit (INDEPENDENT_AMBULATORY_CARE_PROVIDER_SITE_OTHER): Payer: Medicare Other | Admitting: *Deleted

## 2011-04-16 DIAGNOSIS — I4891 Unspecified atrial fibrillation: Secondary | ICD-10-CM

## 2011-05-14 ENCOUNTER — Ambulatory Visit (INDEPENDENT_AMBULATORY_CARE_PROVIDER_SITE_OTHER): Payer: Medicare Other | Admitting: *Deleted

## 2011-05-14 DIAGNOSIS — I4891 Unspecified atrial fibrillation: Secondary | ICD-10-CM

## 2011-05-14 LAB — POCT INR: INR: 2.2

## 2011-05-27 ENCOUNTER — Other Ambulatory Visit: Payer: Self-pay | Admitting: Cardiology

## 2011-05-27 DIAGNOSIS — I6529 Occlusion and stenosis of unspecified carotid artery: Secondary | ICD-10-CM

## 2011-05-28 ENCOUNTER — Encounter (INDEPENDENT_AMBULATORY_CARE_PROVIDER_SITE_OTHER): Payer: Medicare Other | Admitting: Cardiology

## 2011-05-28 DIAGNOSIS — I6529 Occlusion and stenosis of unspecified carotid artery: Secondary | ICD-10-CM

## 2011-06-03 ENCOUNTER — Ambulatory Visit (INDEPENDENT_AMBULATORY_CARE_PROVIDER_SITE_OTHER): Payer: Medicare Other | Admitting: Gastroenterology

## 2011-06-03 ENCOUNTER — Encounter: Payer: Self-pay | Admitting: Gastroenterology

## 2011-06-03 DIAGNOSIS — Z7901 Long term (current) use of anticoagulants: Secondary | ICD-10-CM | POA: Insufficient documentation

## 2011-06-03 DIAGNOSIS — R194 Change in bowel habit: Secondary | ICD-10-CM | POA: Insufficient documentation

## 2011-06-03 DIAGNOSIS — K648 Other hemorrhoids: Secondary | ICD-10-CM | POA: Insufficient documentation

## 2011-06-03 DIAGNOSIS — R198 Other specified symptoms and signs involving the digestive system and abdomen: Secondary | ICD-10-CM

## 2011-06-03 MED ORDER — PEG-KCL-NACL-NASULF-NA ASC-C 100 G PO SOLR: 1.0000 | Freq: Once | ORAL | Status: DC

## 2011-06-03 MED ORDER — HYDROCORTISONE ACETATE 25 MG RE SUPP: 25.0000 mg | Freq: Two times a day (BID) | RECTAL | Status: DC

## 2011-06-03 NOTE — Assessment & Plan Note (Signed)
Plan Anusol HC suppository

## 2011-06-03 NOTE — Assessment & Plan Note (Addendum)
Change in bowel habits is probably functional. A structural abnormality of the colon including fixed narrowing from diverticular disease are other possibilities.  Recommendations #1 fiber supplementation daily #2 colonoscopy  I will check with Dr. Daleen Squibb to determine whether we can hold her Coumadin  Risks, alternatives, and complications of the procedure, including bleeding, perforation, and possible need for surgery, were explained to the patient.  Patient's questions were answered.

## 2011-06-03 NOTE — Progress Notes (Signed)
History of Present Illness: [name] is a pleasant 77 year old white female referred at the request of Dr. Clent Ridges for evaluation of change in bowel habits. She is complaining of moderate constipation. She may go several days without a bowel movement followed by loose stools. This began when she took samples of sanctura but continued after she discontinued the medicine. She denies abdominal pain. She's had some limited rectal bleeding. She's complaining of hemorrhoidal pain. Patient underwent colonoscopy in 2006 which showed diverticula. She is taking Coumadin.    Review of Systems: She has some right lower extremity weakness related to I nerve problem in her back. She has no residual effects following her CVA. Pertinent positive and negative review of systems were noted in the above HPI section. All other review of systems were otherwise negative.    Current Medications, Allergies, Past Medical History, Past Surgical History, in the past for her hemorrhoids. History and Social History were reviewed in [employer] record . Vital signs were reviewed in today's medical record Physical Exam: General: Well developed , well nourished, no acute distress Head: Normocephalic and atraumatic Eyes:  sclerae anicteric, EOMI Ears: Normal auditory acuity Mouth: No deformity or lesions Neck: Supple, no masses or thyromegaly Lungs: Clear throughout to auscultation Heart: Regular rate and rhythm; no murmurs, rubs or bruits Abdomen: Soft, non tender and non distended. No masses, hepatosplenomegaly or hernias noted. Normal Bowel sounds Rectal:no rectal masses; stool heme negative Musculoskeletal: Symmetrical with no gross deformities  Skin: No lesions on visible extremities Pulses:  Normal pulses noted Extremities: No clubbing, cyanosis, edema or deformities noted Neurological: Alert oriented x 4, grossly nonfocal Cervical Nodes:  No significant cervical adenopathy Inguinal Nodes:  No significant inguinal adenopathy Psychological:  Alert and cooperative. Normal mood and affect

## 2011-06-03 NOTE — Patient Instructions (Signed)
Colonoscopy A colonoscopy is an exam to evaluate your entire colon. In this exam, your colon is cleansed. A long fiberoptic tube is inserted through your rectum and into your colon. The fiberoptic scope (endoscope) is a long bundle of enclosed and very flexible fibers. These fibers transmit light to the area examined and send images from that area to your caregiver. Discomfort is usually minimal. You may be given a drug to help you sleep (sedative) during or prior to the procedure. This exam helps to detect lumps (tumors), polyps, inflammation, and areas of bleeding. Your caregiver may also take a small piece of tissue (biopsy) that will be examined under a microscope. BEFORE THE PROCEDURE  A clear liquid diet may be required for 2 days before the exam.   Liquid injections (enemas) or laxatives may be required.   A large amount of electrolyte solution may be given to you to drink over a short period of time. This solution is used to clean out your colon.   You should be present 1 prior to your procedure or as directed by your caregiver.   Check in at the admissions desk to fill out necessary forms if not preregistered. There will be consent forms to sign prior to the procedure. If accompanied by friends or family, there is a waiting area for them while you are having your procedure.  LET YOUR CAREGIVER KNOW ABOUT:  Allergies to food or medicine.  Medicines taken, including vitamins, herbs, eyedrops, over-the-counter medicines, and creams.   Use of steroids (by mouth or creams).   Previous problems with anesthetics or numbing medicines.   History of bleeding problems or blood clots.  Previous surgery.   Other health problems, including diabetes and kidney problems.   Possibility of pregnancy, if this applies.   AFTER THE PROCEDURE  If you received a sedative and/or pain medicine, you will need to arrange for someone to drive you home.   Occasionally, there is a little blood passed  with the first bowel movement. DO NOT be concerned.  HOME CARE INSTRUCTIONS  It is not unusual to pass moderate amounts of gas and experience mild abdominal cramping following the procedure. This is due to air being used to inflate your colon during the exam. Walking or a warm pack on your belly (abdomen) may help.   You may resume all normal meals and activities after sedatives and medicines have worn off.   Only take over-the-counter or prescription medicines for pain, discomfort, or fever as directed by your caregiver. DO NOT use aspirin or blood thinners if a biopsy was taken. Consult your caregiver for medicine usage if biopsies were taken.  FINDING OUT THE RESULTS OF YOUR TEST Not all test results are available during your visit. If your test results are not back during the visit, make an appointment with your caregiver to find out the results. Do not assume everything is normal if you have not heard from your caregiver or the medical facility. It is important for you to follow up on all of your test results. SEEK IMMEDIATE MEDICAL CARE IF:  You pass large blood clots or fill a toilet with blood following the procedure. This may also occur 10 to 14 days following the procedure. This is more likely if a biopsy was taken.   You develop abdominal pain that keeps getting worse and cannot be relieved with medicine.  Document Released: 09/18/2000 Document Re-Released: 12/16/2009 [facility] Patient Information 2011 [place], [place]. Your colonoscopy is scheduled on 07/02/2011 at  8am You can pick up your MoviPrep from your pharmacy today

## 2011-06-05 ENCOUNTER — Encounter: Payer: Self-pay | Admitting: *Deleted

## 2011-06-05 ENCOUNTER — Encounter: Payer: Self-pay | Admitting: Gastroenterology

## 2011-06-05 ENCOUNTER — Telehealth: Payer: Self-pay | Admitting: *Deleted

## 2011-06-05 NOTE — Telephone Encounter (Signed)
Message copied by Theda Belfast on Fri Jun 05, 2011  6:28 PM ------      Message from: Valera Castle C      Created: Fri Jun 05, 2011  1:59 PM       Stable. Followup in 2 years.

## 2011-06-05 NOTE — Telephone Encounter (Signed)
Detailed message left on personal voicemail regarding carotids and follow-up in 2 years. Mylo Red RN

## 2011-06-11 ENCOUNTER — Ambulatory Visit (INDEPENDENT_AMBULATORY_CARE_PROVIDER_SITE_OTHER): Payer: Medicare Other | Admitting: *Deleted

## 2011-06-11 DIAGNOSIS — I4891 Unspecified atrial fibrillation: Secondary | ICD-10-CM

## 2011-06-11 LAB — POCT INR: INR: 2.7

## 2011-06-23 ENCOUNTER — Telehealth: Payer: Self-pay

## 2011-06-23 NOTE — Telephone Encounter (Signed)
Pt is scheduled for a colonoscopy on 07/02/11 with Dr Arlyce Dice.  A letter has been sent to Dr Daleen Squibb regarding anticoagulation and coming off of for procedure.  No documentation in chart as to clearance.  Pt is calling wanting to know what to do about her Coumadin.  Please advise.  Thanks

## 2011-06-24 ENCOUNTER — Telehealth: Payer: Self-pay | Admitting: Cardiology

## 2011-06-24 ENCOUNTER — Encounter: Payer: Self-pay | Admitting: *Deleted

## 2011-06-24 NOTE — Telephone Encounter (Signed)
LMOVM to stop coumadin 3 days prior to colonoscopy as per ov states on 01/08/11. Mylo Red RN

## 2011-06-24 NOTE — Telephone Encounter (Signed)
Pt is scheduled for colonoscopy on the 27th and need to know when dose she stop her coumadin she will be leaving home today around 1pm and will be home around 4pm and tomorrow she will be home until 1:30

## 2011-07-02 ENCOUNTER — Encounter: Payer: Self-pay | Admitting: Gastroenterology

## 2011-07-02 ENCOUNTER — Ambulatory Visit (AMBULATORY_SURGERY_CENTER): Payer: Medicare Other | Admitting: Gastroenterology

## 2011-07-02 DIAGNOSIS — Z1211 Encounter for screening for malignant neoplasm of colon: Secondary | ICD-10-CM

## 2011-07-02 DIAGNOSIS — K5730 Diverticulosis of large intestine without perforation or abscess without bleeding: Secondary | ICD-10-CM

## 2011-07-02 DIAGNOSIS — R198 Other specified symptoms and signs involving the digestive system and abdomen: Secondary | ICD-10-CM

## 2011-07-02 DIAGNOSIS — D126 Benign neoplasm of colon, unspecified: Secondary | ICD-10-CM

## 2011-07-02 HISTORY — PX: COLONOSCOPY: SHX5424

## 2011-07-02 MED ORDER — SODIUM CHLORIDE 0.9 % IV SOLN: 500.0000 mL | INTRAVENOUS | Status: DC

## 2011-07-02 NOTE — Patient Instructions (Signed)
Resume coumadin in am Diverticulosis Diverticulosis is a common condition that develops when small pouches (diverticula) form in the wall of the colon. The risk of diverticulosis increases with age. It happens more often in people who eat a low-fiber diet. Most individuals with diverticulosis have no symptoms. Those individuals with symptoms usually experience belly (abdominal) pain, constipation, or loose stools (diarrhea). HOME CARE INSTRUCTIONS  Increase the amount of fiber in your diet as directed by your caregiver or dietician. This may reduce symptoms of diverticulosis.   Your caregiver may recommend taking a dietary fiber supplement.   Drink at least 6 to 8 glasses of water each day to prevent constipation.   Try not to strain when you have a bowel movement.   Your caregiver may recommend avoiding nuts and seeds to prevent complications, although this is still an uncertain benefit.   Only take over-the-counter or prescription medicines for pain, discomfort, or fever as directed by your caregiver.  FOODS HAVING HIGH FIBER CONTENT INCLUDE:  Fruits. Apple, peach, pear, tangerine, raisins, prunes.   Vegetables. Brussels sprouts, asparagus, broccoli, cabbage, carrot, cauliflower, romaine lettuce, spinach, summer squash, tomato, winter squash, zucchini.   Starchy Vegetables. Baked beans, kidney beans, lima beans, split peas, lentils, potatoes (with skin).   Grains. Whole wheat bread, brown rice, bran flake cereal, plain oatmeal, white rice, shredded wheat, bran muffins.  SEEK IMMEDIATE MEDICAL CARE IF:  You develop increasing pain or severe bloating.  You have an oral temperature above 100  Polyps, Colon  A polyp is extra tissue that grows inside your body. Colon polyps grow in the large intestine. The large intestine, also called the colon, is part of your digestive system. It is a long, hollow tube at the end of your digestive tract where your body makes and stores stool. Most  polyps are not dangerous. They are benign. This means they are not cancerous. But over time, some types of polyps can turn into cancer. Polyps that are smaller than a pea are usually not harmful. But larger polyps could someday become or may already be cancerous. To be safe, doctors remove all polyps and test them.  WHO GETS POLYPS? Anyone can get polyps, but certain people are more likely than others. You may have a greater chance of getting polyps if: You are over 50.  You have had polyps before.  Someone in your family has had polyps.  Someone in your family has had cancer of the large intestine.  Find out if someone in your family has had polyps. You may also be more likely to get polyps if you:  Eat a lot of fatty foods  Smoke  Drink alcohol  Do not exercise Eat too much  SYMPTOMS Most small polyps do not cause symptoms. People often do not know they have one until their caregiver finds it during a regular checkup or while testing them for something else. Some people do have symptoms like these: Bleeding from the anus. You might notice blood on your underwear or on toilet paper after you have had a bowel movement.  Constipation or diarrhea that lasts more than a week.  Blood in the stool. Blood can make stool look black or it can show up as red streaks in the stool.  If you have any of these symptoms, see your caregiver. HOW DOES THE DOCTOR TEST FOR POLYPS? The doctor can use four tests to check for polyps: Digital rectal exam. The caregiver wears gloves and checks your rectum (the last part  of the large intestine) to see if it feels normal. This test would find polyps only in the rectum. Your caregiver may need to do one of the other tests listed below to find polyps higher up in the intestine.  Barium enema. The caregiver puts a liquid called barium into your rectum before taking x-rays of your large intestine. Barium makes your intestine look white in the pictures. Polyps are dark, so they  are easy to see.  Sigmoidoscopy. With this test, the caregiver can see inside your large intestine. A thin flexible tube is placed into your rectum. The device is called a sigmoidoscope, which has a light and a tiny video camera in it. The caregiver uses the sigmoidoscope to look at the last third of your large intestine.  Colonoscopy. This test is like sigmoidoscopy, but the caregiver looks at all of the large intestine. It usually requires sedation. This is the most common method for finding and removing polyps.  TREATMENT The caregiver will remove the polyp during sigmoidoscopy or colonoscopy. The polyp is then tested for cancer.  If you have had polyps, your caregiver may want you to get tested regularly in the future.  PREVENTION There is not one sure way to prevent polyps. You might be able to lower your risk of getting them if you: Eat more fruits and vegetables and less fatty food.  Do not smoke.  Avoid alcohol.  Exercise every day.  Lose weight if you are overweight.  Eating more calcium and folate can also lower your risk of getting polyps. Some foods that are rich in calcium are milk, cheese, and broccoli. Some foods that are rich in folate are chickpeas, kidney beans, and spinach.  Aspirin might help prevent polyps. Studies are under way.  Document Released: 06/17/2004 Document Re-Released: 03/11/2010  [facility] Patient Information 2011 [place], [place]., not controlled by medicine.   You develop vomiting or bowel movements that are bloody or black.  Document Released: 06/18/2004 Document Re-Released: 03/11/2010 [facility] Patient Information 2011 [place], [place].

## 2011-07-02 NOTE — Progress Notes (Signed)
Patient's one daughter is apparently in the medical field.   She commented on the patient's hot pack.  I explained what happened and she was frowning the rest of the visit.   She also stated to her other sister that the patient was in a -fib.  The patient had p waves with an occ.  PVC.  There is a strip in the recovery chart.   The sister also commented on the coumadin restart.   She questioned whether or not it should be started today or tomorrow.   The doctor told her tomorrow.   Patient was discharged.

## 2011-07-02 NOTE — Progress Notes (Signed)
[place].Neigh WITH FIRST DOSE FENTANYL GIVEN, IV NOTED TO BE INFILTRATED. EDEMATOUS AREA NOTED. PT DENIED ANY PAIN . IV DC'D WITH CATHETAR INTACT ANF RESTARTED IN LEFT WRIST. SEDATION STARTED.  WARM PACK APPLIED TO RT FOREARM AREA TO INFILTRATE SITE.  EWM

## 2011-07-03 ENCOUNTER — Telehealth: Payer: Self-pay | Admitting: *Deleted

## 2011-07-03 NOTE — Telephone Encounter (Signed)
Follow up Call- Patient questions:  Do you have a fever, pain , or abdominal swelling? no Pain Score  0 *  Have you tolerated food without any problems? no  Have you been able to return to your normal activities? yes  Do you have any questions about your discharge instructions: Diet   no Medications  no Follow up visit  no  Do you have questions or concerns about your Care? no  Actions: * If pain score is 4 or above: No action needed, pain <4. 

## 2011-07-09 ENCOUNTER — Ambulatory Visit (INDEPENDENT_AMBULATORY_CARE_PROVIDER_SITE_OTHER): Payer: Medicare Other | Admitting: *Deleted

## 2011-07-09 DIAGNOSIS — I4891 Unspecified atrial fibrillation: Secondary | ICD-10-CM

## 2011-07-10 ENCOUNTER — Ambulatory Visit (INDEPENDENT_AMBULATORY_CARE_PROVIDER_SITE_OTHER): Payer: Medicare Other

## 2011-07-10 DIAGNOSIS — Z23 Encounter for immunization: Secondary | ICD-10-CM

## 2011-07-17 LAB — DIFFERENTIAL
Lymphocytes Relative: 36
Monocytes Absolute: 0.6
Monocytes Relative: 10
Neutro Abs: 2.9

## 2011-07-17 LAB — URINALYSIS, ROUTINE W REFLEX MICROSCOPIC
Bilirubin Urine: NEGATIVE
Glucose, UA: NEGATIVE
Ketones, ur: NEGATIVE
Specific Gravity, Urine: 1.008
pH: 7.5

## 2011-07-17 LAB — CBC
HCT: 33 — ABNORMAL LOW
MCV: 101.1 — ABNORMAL HIGH
Platelets: 354
RDW: 16.7 — ABNORMAL HIGH

## 2011-07-17 LAB — COMPREHENSIVE METABOLIC PANEL
Albumin: 3.6
BUN: 9
Calcium: 8.9
Creatinine, Ser: 0.79
Total Protein: 6.4

## 2011-07-20 LAB — CBC
HCT: 33.3 — ABNORMAL LOW
Hemoglobin: 11.2 — ABNORMAL LOW
MCV: 100.6 — ABNORMAL HIGH
Platelets: 252
WBC: 6.6

## 2011-07-20 LAB — I-STAT 8, (EC8 V) (CONVERTED LAB)
Acid-base deficit: 1
Chloride: 105
Glucose, Bld: 91
TCO2: 26
pCO2, Ven: 42.6 — ABNORMAL LOW
pH, Ven: 7.367 — ABNORMAL HIGH

## 2011-07-20 LAB — POCT I-STAT CREATININE
Creatinine, Ser: 0.9
Operator id: 133351

## 2011-07-20 LAB — PROTIME-INR
INR: 1.3
Prothrombin Time: 16.8 — ABNORMAL HIGH

## 2011-07-21 ENCOUNTER — Ambulatory Visit (INDEPENDENT_AMBULATORY_CARE_PROVIDER_SITE_OTHER): Payer: Medicare Other | Admitting: Cardiology

## 2011-07-21 ENCOUNTER — Encounter: Payer: Self-pay | Admitting: Cardiology

## 2011-07-21 ENCOUNTER — Other Ambulatory Visit: Payer: Self-pay | Admitting: Pharmacist

## 2011-07-21 VITALS — BP 132/72 | HR 60 | Ht 60.0 in | Wt 113.0 lb

## 2011-07-21 DIAGNOSIS — I6529 Occlusion and stenosis of unspecified carotid artery: Secondary | ICD-10-CM

## 2011-07-21 DIAGNOSIS — I4891 Unspecified atrial fibrillation: Secondary | ICD-10-CM

## 2011-07-21 DIAGNOSIS — I6340 Cerebral infarction due to embolism of unspecified cerebral artery: Secondary | ICD-10-CM

## 2011-07-21 DIAGNOSIS — I10 Essential (primary) hypertension: Secondary | ICD-10-CM

## 2011-07-21 DIAGNOSIS — E785 Hyperlipidemia, unspecified: Secondary | ICD-10-CM

## 2011-07-21 MED ORDER — WARFARIN SODIUM 5 MG PO TABS: ORAL_TABLET | ORAL | Status: DC

## 2011-07-21 NOTE — Progress Notes (Signed)
HPI [name] returns today for evaluation and management of her carotid artery disease, atrial fibrillation with history of embolic stroke, hypertension, hyperlipidemia, and anticoagulation.  She's doing remarkably well. She restart her statin per my request and her lipids are at goal except for her HDL. Carotid Dopplers were stable and repeat study suggested in 2 years.  She's having no angina or symptoms of TIAs or vascular insufficiency. She denies palpitations or presyncope.  Recent colonoscopy showed no cancerous polyps. I reviewed this with her today. Past Medical History  Diagnosis Date  . Arrhythmia     atrial fibrillation  . Hypertension   . Hyperlipidemia   . Edema leg   . Contact dermatitis and other eczema, due to unspecified cause   . Urinary tract infection, site not specified     Dr. Vonita Moss sees pt  . Urticaria, unspecified   . Lumbago     chronic low back pain  . Gout, unspecified   . Unspecified hypothyroidism   . H/O: CVA (cardiovascular accident) 2008  . Osteoarthrosis, unspecified whether generalized or localized, unspecified site   . Diverticulosis of colon (without mention of hemorrhage)   . Osteoporosis     las DEXA on 01/30/10  . History of melanoma     on right ear  . Skin cancer     forehead  . Blood clotting disorder     Past Surgical History  Procedure Date  . Skin cancer excision 2001    removed right ear per Dr. Jorja Loa, skin grafts harvested from right cheek  . Abdominal hysterectomy   . Tonsillectomy   . Colonoscopy 09/2005    Dr. Arlyce Dice   Repeat every 5 years  . Band hemorrhoidectomy     x 2    Family History  Problem Relation Age of Onset  . Colon polyps Mother     History   Social History  . Marital Status: Widowed    Spouse Name: N/A    Number of Children: 3  . Years of Education: N/A   Occupational History  . retired    Social History Main Topics  . Smoking status: Former Smoker    Quit date: 10/05/1994  .  Smokeless tobacco: Never Used  . Alcohol Use: Yes     occasionally  . Drug Use: No  . Sexually Active: Not on file   Other Topics Concern  . Not on file   Social History Narrative  . No narrative on file    Allergies  Allergen Reactions  . Amoxicillin   . Hydrocodone-Acetaminophen   . Nitrofurantoin   . Sulfonamide Derivatives     Current Outpatient Prescriptions  Medication Sig Dispense Refill  . allopurinol (ZYLOPRIM) 300 MG tablet TAKE 1 TABLET ONCE DAILY  90 tablet  3  . amLODipine (NORVASC) 10 MG tablet Take 1 tablet (10 mg total) by mouth daily.  90 tablet  3  . atenolol (TENORMIN) 25 MG tablet Take 25 mg by mouth. 1 tablet by mouth 2 times a day       . atorvastatin (LIPITOR) 40 MG tablet Take 1 tablet (40 mg total) by mouth daily.  90 tablet  3  . Calcium-Vitamin D-Vitamin K (CALCIUM SOFT CHEWS PO) Take 1,000 mg by mouth daily.        . Cholecalciferol (VITAMIN D3) 2000 UNITS TABS Take 1 tablet by mouth daily.        . furosemide (LASIX) 20 MG tablet TAKE 1 TABLET ONCE DAILY  90 tablet  3  . hydrocortisone (ANUSOL-HC) 25 MG suppository Place 1 suppository (25 mg total) rectally every 12 (twelve) hours.  12 suppository  1  . ibandronate (BONIVA) 150 MG tablet Take 150 mg by mouth every 30 (thirty) days. Take in the morning with a full glass of water, on an empty stomach, and do not take anything else by mouth or lie down for the next 30 min.       [name] levothyroxine (SYNTHROID, LEVOTHROID) 50 MCG tablet TAKE 1 TABLET ONCE DAILY  90 tablet  3  . losartan (COZAAR) 100 MG tablet Take 1 tablet (100 mg total) by mouth daily.  90 tablet  3  . oxyCODONE-acetaminophen (PERCOCET) 5-325 MG per tablet Take 1 tablet by mouth every 4 (four) hours as needed for pain. 1-2 tablets every 6 hours as needed for pain   240 tablet  0  . Pyridoxine HCl (VITAMIN B-6) 500 MG tablet Take 500 mg by mouth daily.        [name] warfarin (COUMADIN) 5 MG tablet Take as directed by Anticoagulation clinic  90  tablet  0    ROS Negative other than HPI.   PE General Appearance: well developed, well nourished in no acute distress HEENT: symmetrical face, PERRLA, good dentition  Neck: no JVD, thyromegaly, or adenopathy, trachea midline Chest: symmetric without deformity Cardiac: PMI non-displaced, RRR, normal S1, S2, no gallop or murmur Lung: clear to ausculation and percussion Vascular: all pulses full without bruits  Abdominal: nondistended, nontender, good bowel sounds, no HSM, no bruits Extremities: no cyanosis, clubbing or edema, no sign of DVT, no varicosities  Skin: normal color, no rashes Neuro: alert and oriented x 3, non-focal Pysch: normal affect Filed Vitals:   07/21/11 1530  BP: 132/72  Pulse: 60  Height: 5' (1.524 m)  Weight: 113 lb (51.256 kg)    EKG  Labs and Studies Reviewed.   Lab Results  Component Value Date   WBC 7.2 08/06/2010   HGB 12.5 08/06/2010   HCT 37.1 08/06/2010   MCV 99.5 08/06/2010   PLT 316.0 08/06/2010      Chemistry      Component Value Date/Time   NA 141 02/19/2011 1104   K 5.1 02/19/2011 1104   CL 108 02/19/2011 1104   CO2 27 02/19/2011 1104   BUN 11 02/19/2011 1104   CREATININE 0.8 02/19/2011 1104      Component Value Date/Time   CALCIUM 9.4 02/19/2011 1104   ALKPHOS 66 02/19/2011 1104   AST 37 02/19/2011 1104   ALT 17 02/19/2011 1104   BILITOT 0.4 02/19/2011 1104       Lab Results  Component Value Date   CHOL 109 02/19/2011   CHOL 213* 08/06/2010   CHOL 193 05/21/2009   Lab Results  Component Value Date   HDL 35.30* 02/19/2011   HDL 39.60 08/06/2010   HDL 16.10 05/21/2009   Lab Results  Component Value Date   LDLCALC 58 02/19/2011   LDLCALC 132* 05/21/2009   LDLCALC 75 08/01/2007   Lab Results  Component Value Date   TRIG 80.0 02/19/2011   TRIG 145.0 08/06/2010   TRIG 93.0 05/21/2009   Lab Results  Component Value Date   CHOLHDL 3 02/19/2011   CHOLHDL 5 08/06/2010   CHOLHDL 5 05/21/2009   No results found for this basename:  HGBA1C   Lab Results  Component Value Date   ALT 17 02/19/2011   AST 37 02/19/2011   ALKPHOS 66 02/19/2011   BILITOT  0.4 02/19/2011   Lab Results  Component Value Date   TSH 1.27 08/06/2010

## 2011-07-21 NOTE — Assessment & Plan Note (Signed)
Is stable. Continue current medical therapy.

## 2011-07-21 NOTE — Assessment & Plan Note (Signed)
Significantly improved control on current meds. No change.

## 2011-07-21 NOTE — Assessment & Plan Note (Signed)
Stable recent carotid Dopplers. Repeat in 2 years.

## 2011-07-21 NOTE — Patient Instructions (Signed)
Your physician wants you to follow-up in: 6 months with Dr. Daleen Squibb. You will receive a reminder letter in the mail two months in advance. If you don't receive a letter, please call our office to schedule the follow-up appointment.  Your physician has requested that you have a carotid duplex. This test is an ultrasound of the carotid arteries in your neck. It looks at blood flow through these arteries that supply the brain with blood. Allow one hour for this exam. There are no restrictions or special instructions. IN 2 YEARS

## 2011-07-21 NOTE — Assessment & Plan Note (Signed)
Markedly improved on statin. Reinforced to continue to take.

## 2011-07-22 LAB — URINALYSIS, ROUTINE W REFLEX MICROSCOPIC
Bilirubin Urine: NEGATIVE
Hgb urine dipstick: NEGATIVE
Ketones, ur: NEGATIVE
Nitrite: NEGATIVE
Specific Gravity, Urine: 1.015
Urobilinogen, UA: 0.2

## 2011-07-22 LAB — LIPID PANEL
Cholesterol: 117
HDL: 30 — ABNORMAL LOW
HDL: 31 — ABNORMAL LOW
LDL Cholesterol: 67
Total CHOL/HDL Ratio: 3.4
Triglycerides: 100
Triglycerides: 115
VLDL: 23

## 2011-07-22 LAB — DIFFERENTIAL
Basophils Absolute: 0.1
Basophils Relative: 1
Eosinophils Absolute: 0.2
Eosinophils Relative: 3
Lymphocytes Relative: 34
Monocytes Absolute: 0.6

## 2011-07-22 LAB — I-STAT 8, (EC8 V) (CONVERTED LAB)
BUN: 19
Bicarbonate: 26.7 — ABNORMAL HIGH
Chloride: 104
Glucose, Bld: 97
pCO2, Ven: 47.3
pH, Ven: 7.359 — ABNORMAL HIGH

## 2011-07-22 LAB — COMPREHENSIVE METABOLIC PANEL
AST: 20
Albumin: 3.3 — ABNORMAL LOW
Alkaline Phosphatase: 77
CO2: 26
Chloride: 106
GFR calc Af Amer: >60
GFR calc non Af Amer: 58 — ABNORMAL LOW
Potassium: 4
Total Bilirubin: 0.6

## 2011-07-22 LAB — URINE CULTURE: Colony Count: >=100000

## 2011-07-22 LAB — CBC
HCT: 34.5 — ABNORMAL LOW
HCT: 37.2
Hemoglobin: 11.6 — ABNORMAL LOW
MCHC: 32.6
MCHC: 33.5
MCV: 99
Platelets: 244
RDW: 14.2 — ABNORMAL HIGH
RDW: 14.9 — ABNORMAL HIGH

## 2011-07-22 LAB — CK TOTAL AND CKMB (NOT AT ARMC): CK, MB: 2.5

## 2011-07-22 LAB — POCT CARDIAC MARKERS
CKMB, poc: 1.6
Operator id: 277751
Troponin i, poc: <0.05

## 2011-07-22 LAB — HOMOCYSTEINE: Homocysteine: 5.4

## 2011-07-22 LAB — PROTIME-INR
INR: 1.2
Prothrombin Time: 13.4

## 2011-07-22 LAB — POCT I-STAT CREATININE
Creatinine, Ser: 1
Operator id: 277751

## 2011-07-22 LAB — URINE MICROSCOPIC-ADD ON

## 2011-08-03 ENCOUNTER — Telehealth: Payer: Self-pay | Admitting: Gastroenterology

## 2011-08-03 ENCOUNTER — Other Ambulatory Visit: Payer: Self-pay | Admitting: Gastroenterology

## 2011-08-03 NOTE — Telephone Encounter (Signed)
R/N anusol HC supp and cream  Take supp QHS and Qam; apply cream after each BM

## 2011-08-03 NOTE — Telephone Encounter (Signed)
Pt states she is still having problems with her hemorrhoids. She reports she has had banding done twice. Pt is out of her suppositories and would like to have a new prescriptions sent to [facility] at [facility]. Dr. Arlyce Dice please advise.

## 2011-08-06 ENCOUNTER — Ambulatory Visit (INDEPENDENT_AMBULATORY_CARE_PROVIDER_SITE_OTHER): Payer: Medicare Other | Admitting: *Deleted

## 2011-08-06 DIAGNOSIS — I4891 Unspecified atrial fibrillation: Secondary | ICD-10-CM

## 2011-08-07 ENCOUNTER — Telehealth: Payer: Self-pay | Admitting: Gastroenterology

## 2011-08-07 MED ORDER — HYDROCORTISONE ACETATE 25 MG RE SUPP: 25.0000 mg | Freq: Two times a day (BID) | RECTAL | Status: DC

## 2011-08-07 MED ORDER — HYDROCORTISONE 2.5 % RE CREA: TOPICAL_CREAM | RECTAL | Status: DC

## 2011-08-07 NOTE — Telephone Encounter (Signed)
Spoke with pharmacy and order was given for cream. See previous phone note. Pt aware.

## 2011-08-07 NOTE — Telephone Encounter (Signed)
See previous phone note.  

## 2011-08-07 NOTE — Telephone Encounter (Signed)
Pt aware and rx sent to pharmacy. 

## 2011-08-24 ENCOUNTER — Other Ambulatory Visit: Payer: Self-pay | Admitting: Gastroenterology

## 2011-08-25 ENCOUNTER — Ambulatory Visit (INDEPENDENT_AMBULATORY_CARE_PROVIDER_SITE_OTHER): Payer: Medicare Other | Admitting: *Deleted

## 2011-08-25 DIAGNOSIS — I4891 Unspecified atrial fibrillation: Secondary | ICD-10-CM

## 2011-08-25 LAB — POCT INR: INR: 2.9

## 2011-08-28 ENCOUNTER — Encounter: Payer: Self-pay | Admitting: Family Medicine

## 2011-08-29 ENCOUNTER — Other Ambulatory Visit: Payer: Self-pay | Admitting: Family Medicine

## 2011-09-01 ENCOUNTER — Telehealth: Payer: Self-pay | Admitting: Cardiology

## 2011-09-01 NOTE — Telephone Encounter (Signed)
New Msg: Pt calling stating that she is aware there is a recall of atorvastatin and pt has not been taking any medication since Friday and pt wants to know what she should be advised to do. Please return pt call to discuss further.

## 2011-09-02 NOTE — Telephone Encounter (Signed)
Pt aware okay to take atorvastatin.   Mylo Red RN

## 2011-09-13 ENCOUNTER — Ambulatory Visit (INDEPENDENT_AMBULATORY_CARE_PROVIDER_SITE_OTHER): Payer: Medicare Other

## 2011-09-13 DIAGNOSIS — J209 Acute bronchitis, unspecified: Secondary | ICD-10-CM

## 2011-09-13 DIAGNOSIS — R05 Cough: Secondary | ICD-10-CM

## 2011-09-13 DIAGNOSIS — R059 Cough, unspecified: Secondary | ICD-10-CM

## 2011-09-13 DIAGNOSIS — R197 Diarrhea, unspecified: Secondary | ICD-10-CM

## 2011-09-15 ENCOUNTER — Ambulatory Visit (INDEPENDENT_AMBULATORY_CARE_PROVIDER_SITE_OTHER): Payer: Medicare Other | Admitting: *Deleted

## 2011-09-15 DIAGNOSIS — I4891 Unspecified atrial fibrillation: Secondary | ICD-10-CM

## 2011-09-15 LAB — POCT INR: INR: 5.3

## 2011-09-24 ENCOUNTER — Ambulatory Visit (INDEPENDENT_AMBULATORY_CARE_PROVIDER_SITE_OTHER): Payer: Medicare Other | Admitting: *Deleted

## 2011-09-24 DIAGNOSIS — I4891 Unspecified atrial fibrillation: Secondary | ICD-10-CM

## 2011-09-24 LAB — POCT INR: INR: 2.6

## 2011-09-30 ENCOUNTER — Telehealth: Payer: Self-pay | Admitting: Gastroenterology

## 2011-09-30 NOTE — Telephone Encounter (Signed)
Spoke with pt and she states she has had tx of her hemorrhoids several times by Dr. Arlyce Dice. States they are bothering her again and does not know what she should do now. Pt instructed to call her PCP as there are no available appts today. Pt verbalized understanding.

## 2011-10-02 ENCOUNTER — Encounter: Payer: Self-pay | Admitting: Nurse Practitioner

## 2011-10-02 ENCOUNTER — Telehealth: Payer: Self-pay | Admitting: Cardiology

## 2011-10-02 ENCOUNTER — Encounter: Payer: Self-pay | Admitting: Cardiology

## 2011-10-02 ENCOUNTER — Ambulatory Visit (INDEPENDENT_AMBULATORY_CARE_PROVIDER_SITE_OTHER): Payer: Medicare Other

## 2011-10-02 DIAGNOSIS — D649 Anemia, unspecified: Secondary | ICD-10-CM

## 2011-10-02 DIAGNOSIS — R5381 Other malaise: Secondary | ICD-10-CM

## 2011-10-02 DIAGNOSIS — R5383 Other fatigue: Secondary | ICD-10-CM

## 2011-10-02 DIAGNOSIS — R0602 Shortness of breath: Secondary | ICD-10-CM

## 2011-10-02 DIAGNOSIS — R062 Wheezing: Secondary | ICD-10-CM

## 2011-10-02 NOTE — Telephone Encounter (Signed)
New Msg: Pt calling wanting to see Dr. Daleen Squibb. Pt said she is getting over bronchitis. Pt went to urgent care over the weekend however pt is continuing to having difficulty breathing and sob. Pt c/o symptoms for about one week or more. Pt is having sob now. Pt call is transferred to triage.

## 2011-10-02 NOTE — Telephone Encounter (Signed)
Pt went back to urgent care, dr frye her pcp out of town so she went back to urgent care, was told to see dr wall, she's requesting to see him Wednesday, pls call [phone]

## 2011-10-02 NOTE — Telephone Encounter (Signed)
I spoke with the patient. She states that Urgent Care did not find anything significant with her breathing. She was given some type of "breathing" test. She was also given prescriptions for Combivent and Albuterol inhalers to use, but was instructed not to use these until see was seen by Dr. Daleen Squibb. I have explained to the patient that his schedule is full for Wednesday, but she will see Norma Fredrickson, NP ( Dr. Daleen Squibb will be in the office) at 11:30am. The patient is agreeable.

## 2011-10-02 NOTE — Telephone Encounter (Signed)
Symptoms reviewed with Dr. Antoine Poche and he suggests pt follow up with PCP.  Pt advised.

## 2011-10-07 ENCOUNTER — Ambulatory Visit (INDEPENDENT_AMBULATORY_CARE_PROVIDER_SITE_OTHER): Payer: Medicare Other | Admitting: *Deleted

## 2011-10-07 ENCOUNTER — Encounter: Payer: Self-pay | Admitting: Nurse Practitioner

## 2011-10-07 ENCOUNTER — Ambulatory Visit (INDEPENDENT_AMBULATORY_CARE_PROVIDER_SITE_OTHER): Payer: Medicare Other | Admitting: Nurse Practitioner

## 2011-10-07 VITALS — BP 102/60 | HR 135 | Ht 63.0 in | Wt 118.4 lb

## 2011-10-07 DIAGNOSIS — K648 Other hemorrhoids: Secondary | ICD-10-CM

## 2011-10-07 DIAGNOSIS — J069 Acute upper respiratory infection, unspecified: Secondary | ICD-10-CM

## 2011-10-07 DIAGNOSIS — Z7901 Long term (current) use of anticoagulants: Secondary | ICD-10-CM

## 2011-10-07 DIAGNOSIS — D649 Anemia, unspecified: Secondary | ICD-10-CM | POA: Insufficient documentation

## 2011-10-07 DIAGNOSIS — I4891 Unspecified atrial fibrillation: Secondary | ICD-10-CM

## 2011-10-07 DIAGNOSIS — R0600 Dyspnea, unspecified: Secondary | ICD-10-CM | POA: Insufficient documentation

## 2011-10-07 DIAGNOSIS — R58 Hemorrhage, not elsewhere classified: Secondary | ICD-10-CM

## 2011-10-07 DIAGNOSIS — K649 Unspecified hemorrhoids: Secondary | ICD-10-CM

## 2011-10-07 MED ORDER — DIGOXIN 125 MCG PO TABS: 125.0000 ug | ORAL_TABLET | Freq: Every day | ORAL | Status: DC

## 2011-10-07 NOTE — Assessment & Plan Note (Signed)
She is recovering from a recent bout of bronchitis.

## 2011-10-07 NOTE — Assessment & Plan Note (Signed)
INR was 5.0 today. Dose was adjusted by the clinic.

## 2011-10-07 NOTE — Assessment & Plan Note (Signed)
Complaining of significant pain from her hemorrhoids. No real bleeding. Will try to get GI to see.

## 2011-10-07 NOTE — Assessment & Plan Note (Signed)
Hemoglobin is 11.2 currently.

## 2011-10-07 NOTE — Assessment & Plan Note (Signed)
Her rate is not controlled. I suspect this is why she is not feeling well. Blood pressure is a little on the low side. Will add Digoxin 0.125 mg daily. Renal function was reportedly normal. I have also increased her Lasix to 40 mg for 3 days, then back to the 20 mg. I will see her back in a week. Patient was subsequently see with Dr. Daleen Squibb and her case reviewed. If she does not have significant improvement in a week, we will need to proceed on with further testing. Patient is agreeable to this plan and will call if any problems develop in the interim.

## 2011-10-07 NOTE — Assessment & Plan Note (Signed)
This is felt to be more related to her atrial fib with RVR. However, did have reduced FEV1 on breathing test at the Urgent Care. We will see how she does with better rate control and extra Lasix. May need additional studies on return.

## 2011-10-07 NOTE — Patient Instructions (Signed)
Your heart rate is too fast.  I want you to increase your Lasix to 40 mg for the next 3 days, then go back to 20 mg each day.  We are going to add Digoxin 0.125 mg each day to help slow your heart rate down.  We will see you back in about a week.   Call the [facility] office at [phone] if you have any questions, problems or concerns.

## 2011-10-07 NOTE — Progress Notes (Signed)
[name] Date of Birth: [birth year] Medical Record #[id number]  History of Present Illness: Ms. [name] is seen today for a work in visit. She is seen for Dr. Daleen Squibb. She is a very pleasant 78 year old female who has known atrial fib. She is on chronic coumadin.  She comes in today. She is here with her daughter. She is complaining of worsening DOE over the past 2 to 3 weeks. She has had a bout of bronchitis and is slowly recovering. Still with a little cough. Was given a Zpac. Labs were reported to be ok except that she was a little anemic. Told to eat salt?? And she did and has more swelling now. No chest pain. No awareness of her atrial fib. Last echo in 2008 showed a normal EF. INR today is up at 5.0.  No dizziness or syncope reported.   Current Outpatient Prescriptions on File Prior to Visit  Medication Sig Dispense Refill  . allopurinol (ZYLOPRIM) 300 MG tablet TAKE 1 TABLET ONCE DAILY  90 tablet  3  . amLODipine (NORVASC) 10 MG tablet Take 1 tablet (10 mg total) by mouth daily.  90 tablet  3  . atenolol (TENORMIN) 25 MG tablet Take 25 mg by mouth 2 (two) times daily. 1 tablet by mouth 2 times a day      . atorvastatin (LIPITOR) 40 MG tablet Take 1 tablet (40 mg total) by mouth daily.  90 tablet  3  . Calcium-Vitamin D-Vitamin K (CALCIUM SOFT CHEWS PO) Take 1,000 mg by mouth daily.        . Cholecalciferol (VITAMIN D3) 2000 UNITS TABS Take 1 tablet by mouth daily.        . furosemide (LASIX) 20 MG tablet TAKE 1 TABLET ONCE DAILY  90 tablet  3  . hydrocortisone (ANUSOL-HC) 25 MG suppository Place 1 suppository (25 mg total) rectally every 12 (twelve) hours.  12 suppository  1  . ibandronate (BONIVA) 150 MG tablet TAKE 1 TABLET MONTHLY  3 tablet  3  . levothyroxine (SYNTHROID, LEVOTHROID) 50 MCG tablet TAKE 1 TABLET ONCE DAILY  90 tablet  3  . losartan (COZAAR) 100 MG tablet Take 1 tablet (100 mg total) by mouth daily.  90 tablet  3  . oxyCODONE-acetaminophen (PERCOCET) 5-325 MG  per tablet Take 1 tablet by mouth every 4 (four) hours as needed for pain. 1-2 tablets every 6 hours as needed for pain   240 tablet  0  . Pyridoxine HCl (VITAMIN B-6) 500 MG tablet Take 500 mg by mouth daily.        [name] warfarin (COUMADIN) 5 MG tablet Take as directed by Anticoagulation clinic  90 tablet  0  . DISCONTD: PROCTOZONE-HC 2.5 % rectal cream PLACE ON RECTUM AFTER EACH BOWEL MOVEMENT  1 g  1  . DISCONTD: hydrocortisone (ANUSOL-HC) 25 MG suppository INSERT 1 SUPPOSITORY RECTALLY EVERY 12 HOURS  12 suppository  1    Allergies  Allergen Reactions  . Amoxicillin   . Hydrocodone-Acetaminophen   . Nitrofurantoin   . Sulfonamide Derivatives     Past Medical History  Diagnosis Date  . Campath-induced atrial fibrillation   . Hypertension   . Hyperlipidemia   . Edema leg   . Contact dermatitis and other eczema, due to unspecified cause   . Urinary tract infection, site not specified     Dr. Vonita Moss sees pt  . Urticaria, unspecified   . Lumbago     chronic low back pain  .  Gout, unspecified   . Unspecified hypothyroidism   . H/O: CVA (cardiovascular accident) 2008  . Osteoarthrosis, unspecified whether generalized or localized, unspecified site   . Diverticulosis of colon (without mention of hemorrhage)   . Osteoporosis     las DEXA on 01/30/10  . History of melanoma     on right ear  . Skin cancer     forehead  . Blood clotting disorder     Past Surgical History  Procedure Date  . Skin cancer excision 2001    removed right ear per Dr. Jorja Loa, skin grafts harvested from right cheek  . Abdominal hysterectomy   . Tonsillectomy   . Colonoscopy 09/2005    Dr. Arlyce Dice   Repeat every 5 years  . Band hemorrhoidectomy     x 2    History  Smoking status  . Former Smoker  . Quit date: 10/05/1994  Smokeless tobacco  . Never Used    History  Alcohol Use  . Yes    occasionally    Family History  Problem Relation Age of Onset  . Colon polyps Mother     Review  of Systems: The review of systems is positive for swelling. She has DOE. No fever or chills. Bronchitis is improving. Blood pressure has been a little on the low side. Having more pain from her hemorrhoids and wants to see GI.  All other systems were reviewed and are negative.  Physical Exam: BP 102/60  Pulse 135  Ht 5\' 3"  (1.6 m)  Wt 118 lb 6.4 oz (53.706 kg)  BMI 20.97 kg/m2  SpO2 96% Patient is very pleasant and in no acute distress. She looks younger than her stated age. Skin is warm and dry. Color is normal.  HEENT is unremarkable. Normocephalic/atraumatic. PERRL. Sclera are nonicteric. Neck is supple. No masses. Neck veins are distended. Lungs are fairly clear. Cardiac exam shows an irregular rate and rhythm. She is tachycardic. Abdomen is soft. Extremities are with 1+ edema. Gait and ROM are intact. No gross neurologic deficits noted.   LABORATORY DATA: EKG shows atrial fib with RVR. Rate is 135.    Assessment / Plan:

## 2011-10-08 ENCOUNTER — Telehealth: Payer: Self-pay | Admitting: Family Medicine

## 2011-10-08 ENCOUNTER — Encounter: Payer: Medicare Other | Admitting: *Deleted

## 2011-10-08 MED ORDER — OXYCODONE-ACETAMINOPHEN 5-325 MG PO TABS: 1.0000 | ORAL_TABLET | ORAL | Status: DC | PRN

## 2011-10-08 NOTE — Telephone Encounter (Signed)
Done, but she needs to see me for any more refills

## 2011-10-08 NOTE — Telephone Encounter (Signed)
Script is ready and spoke with pt. 

## 2011-10-08 NOTE — Telephone Encounter (Signed)
Pt requesting a refill on Percocet 5-325 mg, not sure how she takes it and looks like she has not been seen here in awhile.

## 2011-10-14 ENCOUNTER — Encounter: Payer: Self-pay | Admitting: Nurse Practitioner

## 2011-10-14 ENCOUNTER — Ambulatory Visit (INDEPENDENT_AMBULATORY_CARE_PROVIDER_SITE_OTHER): Payer: Medicare Other | Admitting: Gastroenterology

## 2011-10-14 ENCOUNTER — Encounter: Payer: Self-pay | Admitting: Gastroenterology

## 2011-10-14 ENCOUNTER — Ambulatory Visit (INDEPENDENT_AMBULATORY_CARE_PROVIDER_SITE_OTHER): Payer: Medicare Other | Admitting: Nurse Practitioner

## 2011-10-14 VITALS — BP 104/62 | HR 92 | Ht 63.0 in | Wt 113.6 lb

## 2011-10-14 VITALS — BP 110/60 | HR 110 | Ht 63.0 in | Wt 113.6 lb

## 2011-10-14 DIAGNOSIS — K648 Other hemorrhoids: Secondary | ICD-10-CM

## 2011-10-14 DIAGNOSIS — R0989 Other specified symptoms and signs involving the circulatory and respiratory systems: Secondary | ICD-10-CM

## 2011-10-14 DIAGNOSIS — I4891 Unspecified atrial fibrillation: Secondary | ICD-10-CM

## 2011-10-14 DIAGNOSIS — Z860100 Personal history of colon polyps, unspecified: Secondary | ICD-10-CM | POA: Insufficient documentation

## 2011-10-14 DIAGNOSIS — E785 Hyperlipidemia, unspecified: Secondary | ICD-10-CM

## 2011-10-14 DIAGNOSIS — I10 Essential (primary) hypertension: Secondary | ICD-10-CM

## 2011-10-14 DIAGNOSIS — R0600 Dyspnea, unspecified: Secondary | ICD-10-CM

## 2011-10-14 DIAGNOSIS — R609 Edema, unspecified: Secondary | ICD-10-CM

## 2011-10-14 DIAGNOSIS — R0609 Other forms of dyspnea: Secondary | ICD-10-CM

## 2011-10-14 MED ORDER — ATORVASTATIN CALCIUM 40 MG PO TABS: 40.0000 mg | ORAL_TABLET | Freq: Every day | ORAL | Status: DC

## 2011-10-14 MED ORDER — CIPROFLOXACIN HCL 250 MG PO TABS: 250.0000 mg | ORAL_TABLET | Freq: Two times a day (BID) | ORAL | Status: DC

## 2011-10-14 MED ORDER — ATENOLOL 25 MG PO TABS: 25.0000 mg | ORAL_TABLET | Freq: Two times a day (BID) | ORAL | Status: DC

## 2011-10-14 MED ORDER — ATENOLOL 50 MG PO TABS: 50.0000 mg | ORAL_TABLET | Freq: Two times a day (BID) | ORAL | Status: DC

## 2011-10-14 MED ORDER — WARFARIN SODIUM 5 MG PO TABS: ORAL_TABLET | ORAL | Status: DC

## 2011-10-14 MED ORDER — AMLODIPINE BESYLATE 10 MG PO TABS: 10.0000 mg | ORAL_TABLET | Freq: Every day | ORAL | Status: DC

## 2011-10-14 MED ORDER — PEG-KCL-NACL-NASULF-NA ASC-C 100 G PO SOLR: 1.0000 | Freq: Once | ORAL | Status: DC

## 2011-10-14 NOTE — Assessment & Plan Note (Signed)
2 small adenomatous polyps were removed at last colonoscopy. Due to her advanced age she will not undergo routine surveillance colonoscopy.

## 2011-10-14 NOTE — Progress Notes (Signed)
[name] Date of Birth: 09/08/1926 Medical Record #[id number]  History of Present Illness: [name] is seen back today for a one week check. She is seen for Dr. Daleen Squibb. She was here a week ago and complaining of increased swelling. She does have chronic atrial fib. Her rate was not controlled. Her blood pressure was running low. She was short of breath and just not feeling well.  We added Digoxin 0.125 mg daily and increased her Lasix for 3 days. Her last echo in 2008 showed a normal EF.   She comes back in today. She is improving, but slowly. Her weight is down about 5 pounds. Legs are still swollen and are now red and warm to touch. Her breathing has improved tremendously and she notes that she can do more in the way of activities. No chest pain. Has no awareness of her atrial fib. Denies palpitations. Not dizzy or lightheaded. Bronchitis is basically resolved. Her hemorrhoids continue to bother her and she is undergoing banding per Dr. Arlyce Dice in about 2 weeks. He would like her off of her coumadin for 3 days.   Current Outpatient Prescriptions on File Prior to Visit  Medication Sig Dispense Refill  . allopurinol (ZYLOPRIM) 300 MG tablet TAKE 1 TABLET ONCE DAILY  90 tablet  3  . amLODipine (NORVASC) 10 MG tablet Take 1 tablet (10 mg total) by mouth daily.  90 tablet  3  . atenolol (TENORMIN) 25 MG tablet Take 25 mg by mouth 2 (two) times daily. 1 tablet by mouth 2 times a day      . atorvastatin (LIPITOR) 40 MG tablet Take 1 tablet (40 mg total) by mouth daily.  90 tablet  3  . Calcium-Vitamin D-Vitamin K (CALCIUM SOFT CHEWS PO) Take 1,000 mg by mouth daily.        . Cholecalciferol (VITAMIN D3) 2000 UNITS TABS Take 1 tablet by mouth daily.        . digoxin (LANOXIN) 0.125 MG tablet Take 1 tablet (125 mcg total) by mouth daily.  30 tablet  11  . furosemide (LASIX) 20 MG tablet TAKE 1 TABLET ONCE DAILY  90 tablet  3  . ibandronate (BONIVA) 150 MG tablet TAKE 1 TABLET MONTHLY  3  tablet  3  . levothyroxine (SYNTHROID, LEVOTHROID) 50 MCG tablet TAKE 1 TABLET ONCE DAILY  90 tablet  3  . losartan (COZAAR) 100 MG tablet Take 1 tablet (100 mg total) by mouth daily.  90 tablet  3  . oxyCODONE-acetaminophen (PERCOCET) 5-325 MG per tablet Take 1 tablet by mouth every 4 (four) hours as needed for pain. 1-2 tablets every 6 hours as needed for pain   240 tablet  0  . Pyridoxine HCl (VITAMIN B-6) 500 MG tablet Take 500 mg by mouth daily.        [name] warfarin (COUMADIN) 5 MG tablet Take as directed by Anticoagulation clinic  90 tablet  0    Allergies  Allergen Reactions  . Amoxicillin   . Hydrocodone-Acetaminophen   . Nitrofurantoin   . Sulfonamide Derivatives     Past Medical History  Diagnosis Date  . Atrial fibrillation   . Hypertension   . Hyperlipidemia   . Edema leg   . Contact dermatitis and other eczema, due to unspecified cause   . Urinary tract infection, site not specified     Dr. Vonita Moss sees pt  . Urticaria, unspecified   . Lumbago     chronic low back pain  .  Gout, unspecified   . Unspecified hypothyroidism   . H/O: CVA (cardiovascular accident) 2008  . Osteoarthrosis, unspecified whether generalized or localized, unspecified site   . Diverticulosis of colon (without mention of hemorrhage)   . Osteoporosis     las DEXA on 01/30/10  . History of melanoma     on right ear  . Skin cancer     forehead  . Blood clotting disorder     Past Surgical History  Procedure Date  . Skin cancer excision 2001    removed right ear per Dr. Jorja Loa, skin grafts harvested from right cheek  . Abdominal hysterectomy   . Tonsillectomy   . Colonoscopy 09/2005    Dr. Arlyce Dice   Repeat every 5 years  . Band hemorrhoidectomy     x 2    History  Smoking status  . Former Smoker  . Quit date: 10/05/1994  Smokeless tobacco  . Never Used    History  Alcohol Use No    Family History  Problem Relation Age of Onset  . Colon polyps Mother   . Colon cancer Neg Hx      Review of Systems: The review of systems is per the HPI.   All other systems were reviewed and are negative.  Physical Exam: BP 98/70  Pulse 110  Ht 5\' 3"  (1.6 m)  Wt 113 lb 9.6 oz (51.529 kg)  BMI 20.12 kg/m2 Patient is very pleasant and in no acute distress. She looks better today but still appears quite frail. Skin is warm and dry. Color is normal.  HEENT is unremarkable. Normocephalic/atraumatic. PERRL. Sclera are nonicteric. Neck is supple. No masses. No JVD. Lungs are clear. Cardiac exam shows an irrregular rhythm.Rate is slower but still not ideal. Abdomen is soft. Extremities are still with edema. Right leg is red. Both legs are a little warm to touch.  Gait and ROM are intact. No gross neurologic deficits noted.  LABORATORY DATA: PENDING  Assessment / Plan:

## 2011-10-14 NOTE — Assessment & Plan Note (Signed)
She reports this is now resolved. Echo has not been updated. Last EF was normal from 2008.

## 2011-10-14 NOTE — Assessment & Plan Note (Signed)
She is given the ok for her banding procedure per Dr. Daleen Squibb. Ok to hold her coumadin for 3 days prior to the procedure.

## 2011-10-14 NOTE — Patient Instructions (Addendum)
We are going to give you some antibiotics to help clear your legs up. It is Cipro 250 mg to take two times a day for a week.  Increase your Lasix back to 40 mg for 3 days and then go back to 20 mg each day.  We will need to check your coumadin level early next week.  Stop your Norvasc. Increase the Atenolol to 50 mg two times a day.  You may come off of your coumadin for 3 days prior to your procedure with Dr. Arlyce Dice  Stay on your other medicines.   Call the [facility] office at [phone] if you have any questions, problems or concerns.

## 2011-10-14 NOTE — Patient Instructions (Signed)
You have been scheduled for a flexible sigmoidoscopy with hemorrhoidal banding on 10/28/11 @ 12:30 pm at [facility]. Please follow separate instructions given to you at your visit.

## 2011-10-14 NOTE — Assessment & Plan Note (Signed)
She is symptomatic again despite medical therapy.  Recommendations #1 sigmoidoscopy with repeat band ligation. If possible, I will hold her Coumadin for only 3 days prior to the procedure. We will check with her cardiologist (Dr. Daleen Squibb)

## 2011-10-14 NOTE — Assessment & Plan Note (Signed)
Her rate is slower with the Digoxin but still not ideal. She is asymptomatic. We are stopping her Norvasc. Atenolol is increased to 50 mg BID. We will see her again in about 2 weeks. Labs are checked today as well.

## 2011-10-14 NOTE — Progress Notes (Signed)
History of Present Illness:  [name] has returned for your evaluation of hemorrhoids.  In September, 2012 small adenomatous polyps were removed at colonoscopy. She's undergone band ligation in 2006 and 2008 for hemorrhoids. Over the past 2 weeks she's had intermittent protrusion of hemorrhoids with pain and minimal bleeding. She's used suppositories with limited success. She tends to be constipated.    Review of Systems: Pertinent positive and negative review of systems were noted in the above HPI section. All other review of systems were otherwise negative.    Current Medications, Allergies, Past Medical History, Past Surgical History, Family History and Social History were reviewed in [facility] electronic medical record  Vital signs were reviewed in today's medical record. Physical Exam: General: Well developed , well nourished, no acute distress Head: Normocephalic and atraumatic Eyes:  sclerae anicteric, EOMI Ears: Normal auditory acuity Mouth: No deformity or lesions Lungs: Clear throughout to auscultation Heart: Regular rate and rhythm; no murmurs, rubs or bruits Abdomen: Soft, non tender and non distended. No masses, hepatosplenomegaly or hernias noted. Normal Bowel sounds Rectal: There are no external abnormalities Musculoskeletal: Symmetrical with no gross deformities  Pulses:  Normal pulses noted Extremities: No clubbing, cyanosis, edema or deformities noted Neurological: Alert oriented x 4, grossly nonfocal Psychological:  Alert and cooperative. Normal mood and affect    

## 2011-10-14 NOTE — Assessment & Plan Note (Signed)
She is subsequently seen with Dr. Daleen Squibb. We will increase her Lasix for another 3 days, then back to her usual dose. We are stopping her Norvasc altogether. This will probably help as well. Will treat her for what looks like is early cellulitis with Cipro 250 mg BID for a week. She will need close monitoring of her coumadin. We will see her back in 2 weeks prior to her hemorrhoid banding procedure. Patient is agreeable to this plan and will call if any problems develop in the interim.

## 2011-10-15 ENCOUNTER — Other Ambulatory Visit: Payer: Self-pay | Admitting: Cardiology

## 2011-10-15 LAB — CBC WITH DIFFERENTIAL/PLATELET
Basophils Absolute: 0.1 10*3/uL (ref 0.0–0.1)
Basophils Relative: 0.9 % (ref 0.0–3.0)
Eosinophils Absolute: 0.2 10*3/uL (ref 0.0–0.7)
Eosinophils Relative: 3.8 % (ref 0.0–5.0)
HCT: 35.3 % — ABNORMAL LOW (ref 36.0–46.0)
Hemoglobin: 11.8 g/dL — ABNORMAL LOW (ref 12.0–15.0)
Lymphocytes Relative: 30.9 % (ref 12.0–46.0)
Lymphs Abs: 1.9 10*3/uL (ref 0.7–4.0)
MCHC: 33.4 g/dL (ref 30.0–36.0)
MCV: 99.1 fl (ref 78.0–100.0)
Monocytes Absolute: 0.5 10*3/uL (ref 0.1–1.0)
Monocytes Relative: 7.7 % (ref 3.0–12.0)
Neutro Abs: 3.5 10*3/uL (ref 1.4–7.7)
Neutrophils Relative %: 56.7 % (ref 43.0–77.0)
Platelets: 294 10*3/uL (ref 150.0–400.0)
RBC: 3.56 Mil/uL — ABNORMAL LOW (ref 3.87–5.11)
RDW: 15.9 % — ABNORMAL HIGH (ref 11.5–14.6)
WBC: 6.2 10*3/uL (ref 4.5–10.5)

## 2011-10-15 LAB — DIGOXIN LEVEL: Digoxin Level: 1.4 ng/mL (ref 0.8–2.0)

## 2011-10-15 LAB — BASIC METABOLIC PANEL
BUN: 8 mg/dL (ref 6–23)
CO2: 28 mEq/L (ref 19–32)
Calcium: 9.1 mg/dL (ref 8.4–10.5)
Chloride: 100 mEq/L (ref 96–112)
Creatinine, Ser: 0.8 mg/dL (ref 0.4–1.2)
GFR: 74.88 mL/min (ref >60.00)
Glucose, Bld: 82 mg/dL (ref 70–99)
Potassium: 3.4 mEq/L — ABNORMAL LOW (ref 3.5–5.1)
Sodium: 135 mEq/L (ref 135–145)

## 2011-10-16 ENCOUNTER — Other Ambulatory Visit: Payer: Self-pay | Admitting: *Deleted

## 2011-10-16 ENCOUNTER — Telehealth: Payer: Self-pay | Admitting: Cardiology

## 2011-10-16 MED ORDER — POTASSIUM CHLORIDE CRYS ER 20 MEQ PO TBCR: EXTENDED_RELEASE_TABLET | ORAL | Status: DC

## 2011-10-16 MED ORDER — CIPROFLOXACIN HCL 250 MG PO TABS: 250.0000 mg | ORAL_TABLET | Freq: Two times a day (BID) | ORAL | Status: DC

## 2011-10-16 NOTE — Telephone Encounter (Signed)
Resent to pharmacy of choice as requested, last one was sent to [facility].

## 2011-10-16 NOTE — Telephone Encounter (Signed)
Pt called with labs and new script, pt agreed to plan and recheck.

## 2011-10-16 NOTE — Telephone Encounter (Signed)
Pt was in Wednesday, and lori prescribed cipro for swelling in legs, not yet called in and needs today, [facility] [address], pt [phone]

## 2011-10-19 ENCOUNTER — Ambulatory Visit (INDEPENDENT_AMBULATORY_CARE_PROVIDER_SITE_OTHER): Payer: Medicare Other | Admitting: *Deleted

## 2011-10-19 DIAGNOSIS — I4891 Unspecified atrial fibrillation: Secondary | ICD-10-CM

## 2011-10-21 ENCOUNTER — Encounter: Payer: Medicare Other | Admitting: *Deleted

## 2011-10-22 ENCOUNTER — Telehealth: Payer: Self-pay | Admitting: Gastroenterology

## 2011-10-22 NOTE — Telephone Encounter (Signed)
Pt wanted to know if she was supposed to do movi-prep or just the mag citrate and enema. Reviewed the instructions with the pt and using the mag citrate and enema. Pt aware.

## 2011-10-26 ENCOUNTER — Ambulatory Visit (INDEPENDENT_AMBULATORY_CARE_PROVIDER_SITE_OTHER): Payer: Medicare Other | Admitting: Nurse Practitioner

## 2011-10-26 ENCOUNTER — Encounter: Payer: Self-pay | Admitting: Nurse Practitioner

## 2011-10-26 VITALS — BP 118/74 | HR 68 | Ht 63.0 in | Wt 108.0 lb

## 2011-10-26 DIAGNOSIS — R609 Edema, unspecified: Secondary | ICD-10-CM

## 2011-10-26 DIAGNOSIS — I10 Essential (primary) hypertension: Secondary | ICD-10-CM

## 2011-10-26 DIAGNOSIS — K648 Other hemorrhoids: Secondary | ICD-10-CM

## 2011-10-26 DIAGNOSIS — I4891 Unspecified atrial fibrillation: Secondary | ICD-10-CM

## 2011-10-26 LAB — BASIC METABOLIC PANEL
BUN: 10 mg/dL (ref 6–23)
CO2: 26 mEq/L (ref 19–32)
Calcium: 9 mg/dL (ref 8.4–10.5)
Chloride: 102 mEq/L (ref 96–112)
Creatinine, Ser: 0.7 mg/dL (ref 0.4–1.2)
GFR: 84.84 mL/min (ref >60.00)
Glucose, Bld: 79 mg/dL (ref 70–99)
Potassium: 3.7 mEq/L (ref 3.5–5.1)
Sodium: 136 mEq/L (ref 135–145)

## 2011-10-26 NOTE — Progress Notes (Signed)
[name] Date of Birth: 05/29/1926 Medical Record #[id number]  History of Present Illness: [name] is seen back today for a follow up visit. She is seen for Dr. Daleen Squibb. She has had worsening edema and atrial fib with RVR. We have stopped her Norvasc. Increased the atenolol and added digoxin and had increased her diuretics. She did have some early cellulitis which was treated with a short course of antibiotics. She is planning on having banding of her hemorrhoids later this week.  She comes in today. She is doing well. Swelling has resolved. Breathing is ok. No palpitations. No chest pain. Overall, she feels better. Tolerating her current regimen without issues.   Current Outpatient Prescriptions on File Prior to Visit  Medication Sig Dispense Refill  . allopurinol (ZYLOPRIM) 300 MG tablet TAKE 1 TABLET ONCE DAILY  90 tablet  3  . atenolol (TENORMIN) 50 MG tablet Take 1 tablet (50 mg total) by mouth 2 (two) times daily.  180 tablet  3  . atorvastatin (LIPITOR) 40 MG tablet Take 1 tablet (40 mg total) by mouth daily.  90 tablet  3  . Calcium-Vitamin D-Vitamin K (CALCIUM SOFT CHEWS PO) Take 1,000 mg by mouth daily.        . Cholecalciferol (VITAMIN D3) 2000 UNITS TABS Take 1 tablet by mouth daily.        . digoxin (LANOXIN) 0.125 MG tablet Take 1 tablet (125 mcg total) by mouth daily.  30 tablet  11  . furosemide (LASIX) 20 MG tablet TAKE 1 TABLET ONCE DAILY  90 tablet  3  . ibandronate (BONIVA) 150 MG tablet TAKE 1 TABLET MONTHLY  3 tablet  3  . levothyroxine (SYNTHROID, LEVOTHROID) 50 MCG tablet TAKE 1 TABLET ONCE DAILY  90 tablet  3  . losartan (COZAAR) 100 MG tablet Take 1 tablet (100 mg total) by mouth daily.  90 tablet  3  . oxyCODONE-acetaminophen (PERCOCET) 5-325 MG per tablet Take 1 tablet by mouth every 4 (four) hours as needed for pain. 1-2 tablets every 6 hours as needed for pain   240 tablet  0  . Pyridoxine HCl (VITAMIN B-6) 500 MG tablet Take 500 mg by mouth daily.        [name]  warfarin (COUMADIN) 5 MG tablet Take as directed by Anticoagulation clinic  90 tablet  3    Allergies  Allergen Reactions  . Amlodipine     Has significant edema  . Amoxicillin   . Hydrocodone-Acetaminophen   . Nitrofurantoin   . Sulfonamide Derivatives     Past Medical History  Diagnosis Date  . Atrial fibrillation   . Hypertension   . Hyperlipidemia   . Edema leg   . Contact dermatitis and other eczema, due to unspecified cause   . Urinary tract infection, site not specified     Dr. Vonita Moss sees pt  . Urticaria, unspecified   . Lumbago     chronic low back pain  . Gout, unspecified   . Unspecified hypothyroidism   . H/O: CVA (cardiovascular accident) 2008  . Osteoarthrosis, unspecified whether generalized or localized, unspecified site   . Diverticulosis of colon (without mention of hemorrhage)   . Osteoporosis     las DEXA on 01/30/10  . History of melanoma     on right ear  . Skin cancer     forehead  . Blood clotting disorder     Past Surgical History  Procedure Date  . Skin cancer excision 2001  removed right ear per Dr. Jorja Loa, skin grafts harvested from right cheek  . Abdominal hysterectomy   . Tonsillectomy   . Colonoscopy 09/2005    Dr. Arlyce Dice   Repeat every 5 years  . Band hemorrhoidectomy     x 2    History  Smoking status  . Former Smoker  . Quit date: 10/05/1994  Smokeless tobacco  . Never Used    History  Alcohol Use No    Family History  Problem Relation Age of Onset  . Colon polyps Mother   . Colon cancer Neg Hx     Review of Systems: The review of systems is per the HPI.  All other systems were reviewed and are negative.  Physical Exam: BP 118/74  Pulse 68  Ht 5\' 3"  (1.6 m)  Wt 108 lb (48.988 kg)  BMI 19.13 kg/m2 Weight is down 5 pounds.  Patient is very pleasant and in no acute distress. Skin is warm and dry. Color is normal.  HEENT is unremarkable. Normocephalic/atraumatic. PERRL. Sclera are nonicteric. Neck is  supple. No masses. No JVD. Lungs are clear. Cardiac exam shows an irregular rhythm. Rate is controlled. Abdomen is soft. Extremities are without any edema today. Gait and ROM are intact. No gross neurologic deficits noted.   LABORATORY DATA:   Assessment / Plan:

## 2011-10-26 NOTE — Assessment & Plan Note (Signed)
Her edema has resolved. We will leave her off the Norvasc. She does not need further antibiotics. We will see her back in 3 months. Patient is agreeable to this plan and will call if any problems develop in the interim.

## 2011-10-26 NOTE — Assessment & Plan Note (Signed)
She has already been given the ok for her banding procedure per Dr. Daleen Squibb. Coumadin will be held for 3 days prior to procedure.

## 2011-10-26 NOTE — Patient Instructions (Addendum)
I think you are better.  Stay on your current medicines.  We will recheck your potassium level today.   I will have you see Dr. Daleen Squibb in about 3 months.   Call the [facility] office at [phone] if you have any questions, problems or concerns.

## 2011-10-26 NOTE — Assessment & Plan Note (Signed)
She is better overall. Rate is controlled today on physical exam. Will leave her on the higher dose of atenolol and digoxin. I will have Dr. Daleen Squibb see her in 3 months. BMET is checked today.

## 2011-10-26 NOTE — Assessment & Plan Note (Signed)
Blood pressure looks good. No change with her current regimen. 

## 2011-10-28 ENCOUNTER — Ambulatory Visit (HOSPITAL_COMMUNITY)
Admission: RE | Admit: 2011-10-28 | Discharge: 2011-10-28 | Disposition: A | Discharge status: Skilled Nursing Facility | Payer: Medicare Other | Source: Ambulatory Visit | Attending: Gastroenterology | Admitting: Gastroenterology

## 2011-10-28 ENCOUNTER — Encounter (HOSPITAL_COMMUNITY)
Admission: RE | Disposition: A | Discharge status: Skilled Nursing Facility | Payer: Self-pay | Source: Ambulatory Visit | Attending: Gastroenterology

## 2011-10-28 ENCOUNTER — Encounter (HOSPITAL_COMMUNITY): Payer: Self-pay | Admitting: Gastroenterology

## 2011-10-28 DIAGNOSIS — K648 Other hemorrhoids: Secondary | ICD-10-CM | POA: Insufficient documentation

## 2011-10-28 HISTORY — PX: FLEXIBLE SIGMOIDOSCOPY: SHX5431

## 2011-10-28 HISTORY — PX: GASTRIC VARICES BANDING: SHX5519

## 2011-10-28 SURGERY — SIGMOIDOSCOPY, FLEXIBLE
Anesthesia: Moderate Sedation

## 2011-10-28 MED ORDER — SODIUM CHLORIDE 0.9 % IV SOLN
Freq: Once | INTRAVENOUS | Status: AC
  Administered 2011-10-28: 12:00:00 via INTRAVENOUS

## 2011-10-28 MED ORDER — MIDAZOLAM HCL 10 MG/2ML IJ SOLN
INTRAMUSCULAR | Status: DC | PRN
  Administered 2011-10-28: 1 mg via INTRAVENOUS
  Administered 2011-10-28: .5 mg via INTRAVENOUS
  Administered 2011-10-28: 1 mg via INTRAVENOUS

## 2011-10-28 MED ORDER — MIDAZOLAM HCL 10 MG/2ML IJ SOLN
INTRAMUSCULAR | Status: AC
  Filled 2011-10-28: qty 2

## 2011-10-28 MED ORDER — FENTANYL CITRATE 0.05 MG/ML IJ SOLN
INTRAMUSCULAR | Status: AC
  Filled 2011-10-28: qty 2

## 2011-10-28 MED ORDER — ACETAMINOPHEN-CODEINE #3 300-30 MG PO TABS: 1.0000 | ORAL_TABLET | ORAL | Status: DC | PRN

## 2011-10-28 MED ORDER — FENTANYL CITRATE 0.05 MG/ML IJ SOLN
INTRAMUSCULAR | Status: DC | PRN
  Administered 2011-10-28: 10 ug via INTRAVENOUS
  Administered 2011-10-28 (×2): 12.5 ug via INTRAVENOUS

## 2011-10-28 NOTE — Interval H&P Note (Signed)
History and Physical Interval Note:  10/28/2011 12:26 PM  [name]  has presented today for surgery, with the diagnosis of hemorrhoids  The various methods of treatment have been discussed with the patient and family. After consideration of risks, benefits and other options for treatment, the patient has consented to  Procedure(s): FLEXIBLE SIGMOIDOSCOPY HEMORRHOID BANDING as a surgical intervention .  The patients' history has been reviewed, patient examined, no change in status, stable for surgery.  I have reviewed the patients' chart and labs.  Questions were answered to the patient's satisfaction.    The recent H&P (dated *10/14/11**) was reviewed, the patient was examined and there is no change in the patients condition since that H&P was completed.   Melvia Heaps  10/28/2011, 12:26 PM    Melvia Heaps

## 2011-10-28 NOTE — Op Note (Signed)
[facility] [address] [place], [place]  [address]  FLEXIBLE SIGMOIDOSCOPY PROCEDURE REPORT  PATIENT:  Christy, Key  MR#:  [id number] BIRTHDATE:  [birth year], 85 yrs. old  GENDER:  female  ENDOSCOPIST:  Barbette Hair. Arlyce Dice, MD Referred by:  Tera Mater Clent Ridges, M.D.  PROCEDURE DATE:  10/28/2011 PROCEDURE:  Flexible Sigmoidoscopy with banding ASA CLASS:  Class II INDICATIONS:  treatment of hemorrhoids  MEDICATIONS:   These medications were titrated to patient response per physician's verbal order, Fentanyl 35 mcg IV, Versed 3.5 mg IV  DESCRIPTION OF PROCEDURE:   After the risks benefits and alternatives of the procedure were thoroughly explained, informed consent was obtained.  Digital rectal exam was performed and revealed moderate external hemorrhoids.  grade 3 internal hemorrhoids The Pentax Gastroscope [id number] endoscope was introduced through the anus and advanced to the sigmoid colon, without limitations.  The quality of the prep was .  The instrument was then slowly withdrawn as the mucosa was fully examined.  Internal hemorrhoids were found. hemorrhoidal banding 4 bands were placed over the 3 hemorrhoidal bundles (a second band was placed over the first on the first hemorrhoidal bundle  The examination was otherwise normal.   Retroflexed views in the rectum revealed medium hemorrhoids.    The scope was then withdrawn from the patient and the procedure terminated.  COMPLICATIONS:  None  ENDOSCOPIC IMPRESSION: 1) Internal hemorrhoids-status post band ligation 2) Otherwise normal examination. RECOMMENDATIONS: 1) Call the office to schedule a followup office visit for _4 weeks  REPEAT EXAM:  No  ______________________________ Barbette Hair. Arlyce Dice, MD  CC:  n. eSIGNED:   Barbette Hair. Hyden Soley at 10/28/2011 01:05 PM  Pollie Meyer, [phone]

## 2011-10-28 NOTE — H&P (View-Only) (Signed)
History of Present Illness:  Mrs. Eye has returned for your evaluation of hemorrhoids.  In September, 2012 small adenomatous polyps were removed at colonoscopy. She's undergone band ligation in 2006 and 2008 for hemorrhoids. Over the past 2 weeks she's had intermittent protrusion of hemorrhoids with pain and minimal bleeding. She's used suppositories with limited success. She tends to be constipated.    Review of Systems: Pertinent positive and negative review of systems were noted in the above HPI section. All other review of systems were otherwise negative.    Current Medications, Allergies, Past Medical History, Past Surgical History, Family History and Social History were reviewed in [employer] [facility] electronic medical record  Vital signs were reviewed in today's medical record. Physical Exam: General: Well developed , well nourished, no acute distress Head: Normocephalic and atraumatic Eyes:  sclerae anicteric, EOMI Ears: Normal auditory acuity Mouth: No deformity or lesions Lungs: Clear throughout to auscultation Heart: Regular rate and rhythm; no murmurs, rubs or bruits Abdomen: Soft, non tender and non distended. No masses, hepatosplenomegaly or hernias noted. Normal Bowel sounds Rectal: There are no external abnormalities Musculoskeletal: Symmetrical with no gross deformities  Pulses:  Normal pulses noted Extremities: No clubbing, cyanosis, edema or deformities noted Neurological: Alert oriented x 4, grossly nonfocal Psychological:  Alert and cooperative. Normal mood and affect

## 2011-10-29 ENCOUNTER — Encounter (HOSPITAL_COMMUNITY): Payer: Self-pay

## 2011-10-29 ENCOUNTER — Telehealth: Payer: Self-pay | Admitting: Gastroenterology

## 2011-10-29 MED ORDER — ACETAMINOPHEN-CODEINE #3 300-30 MG PO TABS: 1.0000 | ORAL_TABLET | ORAL | Status: AC | PRN

## 2011-10-29 NOTE — Telephone Encounter (Signed)
Rx was printed out yesterday but not faxed to the pharmacy. New rx sent to the pharmacy.

## 2011-10-29 NOTE — Telephone Encounter (Signed)
Pt states she was supposed to have some medication called in yesterday following her procedure and it is not at the pharmacy. Dr. Arlyce Dice please advise.

## 2011-10-29 NOTE — Telephone Encounter (Signed)
She was supposed to get tylenol #3, 2 every 4-6h prn   #25

## 2011-11-04 ENCOUNTER — Ambulatory Visit (INDEPENDENT_AMBULATORY_CARE_PROVIDER_SITE_OTHER): Payer: Medicare Other | Admitting: *Deleted

## 2011-11-04 DIAGNOSIS — I4891 Unspecified atrial fibrillation: Secondary | ICD-10-CM

## 2011-11-04 LAB — POCT INR: INR: 1.8

## 2011-11-12 ENCOUNTER — Telehealth: Payer: Self-pay | Admitting: Family Medicine

## 2011-11-12 NOTE — Telephone Encounter (Signed)
Pt left voice message, [facility] Please call pt to find out what medications she needs?

## 2011-11-13 NOTE — Telephone Encounter (Signed)
Pt called back, she needs Synthroid & Lasix and [facility] [address]

## 2011-11-16 MED ORDER — LEVOTHYROXINE SODIUM 50 MCG PO TABS: 50.0000 ug | ORAL_TABLET | Freq: Every day | ORAL | Status: DC

## 2011-11-16 MED ORDER — FUROSEMIDE 20 MG PO TABS: 20.0000 mg | ORAL_TABLET | Freq: Every day | ORAL | Status: DC

## 2011-11-16 NOTE — Telephone Encounter (Signed)
Can we do this ?

## 2011-11-16 NOTE — Telephone Encounter (Signed)
Pt has scheduled a office visit for next week, can we send in these 2 refills?

## 2011-11-16 NOTE — Telephone Encounter (Signed)
Yes bit for 30 days each not 90

## 2011-11-16 NOTE — Telephone Encounter (Signed)
Script sent e-scribe and I spoke with pt. 

## 2011-11-19 ENCOUNTER — Ambulatory Visit (INDEPENDENT_AMBULATORY_CARE_PROVIDER_SITE_OTHER): Payer: Medicare Other | Admitting: Pharmacist

## 2011-11-19 DIAGNOSIS — I4891 Unspecified atrial fibrillation: Secondary | ICD-10-CM

## 2011-11-19 LAB — POCT INR: INR: 1.9

## 2011-11-26 ENCOUNTER — Ambulatory Visit (INDEPENDENT_AMBULATORY_CARE_PROVIDER_SITE_OTHER): Payer: Medicare Other | Admitting: Family Medicine

## 2011-11-26 ENCOUNTER — Encounter: Payer: Self-pay | Admitting: Family Medicine

## 2011-11-26 VITALS — BP 112/74 | HR 135 | Temp 97.5°F | Ht <= 58 in | Wt 108.0 lb

## 2011-11-26 DIAGNOSIS — M810 Age-related osteoporosis without current pathological fracture: Secondary | ICD-10-CM

## 2011-11-26 DIAGNOSIS — IMO0002 Reserved for concepts with insufficient information to code with codable children: Secondary | ICD-10-CM

## 2011-11-26 DIAGNOSIS — E039 Hypothyroidism, unspecified: Secondary | ICD-10-CM

## 2011-11-26 DIAGNOSIS — I4891 Unspecified atrial fibrillation: Secondary | ICD-10-CM

## 2011-11-26 DIAGNOSIS — E785 Hyperlipidemia, unspecified: Secondary | ICD-10-CM

## 2011-11-26 DIAGNOSIS — I6789 Other cerebrovascular disease: Secondary | ICD-10-CM

## 2011-11-26 DIAGNOSIS — I10 Essential (primary) hypertension: Secondary | ICD-10-CM

## 2011-11-26 DIAGNOSIS — M109 Gout, unspecified: Secondary | ICD-10-CM

## 2011-11-26 LAB — CBC WITH DIFFERENTIAL/PLATELET
Basophils Absolute: 0.1 10*3/uL (ref 0.0–0.1)
Basophils Relative: 0.8 % (ref 0.0–3.0)
Eosinophils Absolute: 0.2 10*3/uL (ref 0.0–0.7)
HCT: 39.9 % (ref 36.0–46.0)
Hemoglobin: 12.9 g/dL (ref 12.0–15.0)
Lymphocytes Relative: 26.9 % (ref 12.0–46.0)
Lymphs Abs: 1.8 10*3/uL (ref 0.7–4.0)
MCHC: 32.4 g/dL (ref 30.0–36.0)
MCV: 97.6 fl (ref 78.0–100.0)
Monocytes Absolute: 0.6 10*3/uL (ref 0.1–1.0)
Neutro Abs: 4.1 10*3/uL (ref 1.4–7.7)
RBC: 4.09 Mil/uL (ref 3.87–5.11)
RDW: 17 % — ABNORMAL HIGH (ref 11.5–14.6)

## 2011-11-26 LAB — HEPATIC FUNCTION PANEL
ALT: 20 U/L (ref 0–35)
AST: 48 U/L — ABNORMAL HIGH (ref 0–37)
Alkaline Phosphatase: 67 U/L (ref 39–117)
Total Bilirubin: 0.8 mg/dL (ref 0.3–1.2)

## 2011-11-26 LAB — POCT URINALYSIS DIPSTICK
Bilirubin, UA: NEGATIVE
Blood, UA: NEGATIVE
Ketones, UA: NEGATIVE
Leukocytes, UA: NEGATIVE
Protein, UA: NEGATIVE
pH, UA: 6

## 2011-11-26 LAB — BASIC METABOLIC PANEL
BUN: 8 mg/dL (ref 6–23)
Chloride: 103 mEq/L (ref 96–112)
Glucose, Bld: 94 mg/dL (ref 70–99)
Potassium: 3.6 mEq/L (ref 3.5–5.1)
Sodium: 138 mEq/L (ref 135–145)

## 2011-11-26 LAB — LIPID PANEL: Cholesterol: 116 mg/dL (ref 0–200)

## 2011-11-26 MED ORDER — LEVOTHYROXINE SODIUM 50 MCG PO TABS: 50.0000 ug | ORAL_TABLET | Freq: Every day | ORAL | Status: DC

## 2011-11-26 MED ORDER — LOSARTAN POTASSIUM 100 MG PO TABS: 100.0000 mg | ORAL_TABLET | Freq: Every day | ORAL | Status: DC

## 2011-11-26 MED ORDER — POTASSIUM CHLORIDE CRYS ER 20 MEQ PO TBCR: 20.0000 meq | EXTENDED_RELEASE_TABLET | Freq: Every day | ORAL | Status: DC

## 2011-11-26 MED ORDER — ALLOPURINOL 300 MG PO TABS: 300.0000 mg | ORAL_TABLET | Freq: Every day | ORAL | Status: DC

## 2011-11-26 MED ORDER — IBANDRONATE SODIUM 150 MG PO TABS: 150.0000 mg | ORAL_TABLET | ORAL | Status: DC

## 2011-11-26 MED ORDER — FUROSEMIDE 20 MG PO TABS: 20.0000 mg | ORAL_TABLET | Freq: Every day | ORAL | Status: DC

## 2011-11-26 NOTE — Progress Notes (Signed)
  Subjective:    Patient ID: [name], female    DOB: Jul 29, 1926, 78 y.o.   MRN: [id number]  HPI 78 yr old female for a cpx. She feels well in general. She still drives for short distances during the daytime. Tries to stay active.    Review of Systems  Constitutional: Negative.   HENT: Negative.   Eyes: Negative.   Respiratory: Negative.   Cardiovascular: Negative.   Gastrointestinal: Negative.   Genitourinary: Negative for dysuria, urgency, frequency, hematuria, flank pain, decreased urine volume, enuresis, difficulty urinating, pelvic pain and dyspareunia.  Musculoskeletal: Negative.   Skin: Negative.   Neurological: Negative.   Hematological: Negative.   Psychiatric/Behavioral: Negative.        Objective:   Physical Exam  Constitutional: She is oriented to person, place, and time. She appears well-developed and well-nourished. No distress.  HENT:  Head: Normocephalic and atraumatic.  Right Ear: External ear normal.  Left Ear: External ear normal.  Nose: Nose normal.  Mouth/Throat: Oropharynx is clear and moist. No oropharyngeal exudate.  Eyes: Conjunctivae and EOM are normal. Pupils are equal, round, and reactive to light. No scleral icterus.  Neck: Normal range of motion. Neck supple. No JVD present. No thyromegaly present.  Cardiovascular: Normal rate, regular rhythm, normal heart sounds and intact distal pulses.  Exam reveals no gallop and no friction rub.   No murmur heard. Pulmonary/Chest: Effort normal and breath sounds normal. No respiratory distress. She has no wheezes. She has no rales. She exhibits no tenderness.  Abdominal: Soft. Bowel sounds are normal. She exhibits no distension and no mass. There is no tenderness. There is no rebound and no guarding.  Musculoskeletal: Normal range of motion. She exhibits no edema and no tenderness.  Lymphadenopathy:    She has no cervical adenopathy.  Neurological: She is alert and oriented to person, place, and time.  She has normal reflexes. No cranial nerve deficit. She exhibits normal muscle tone. Coordination normal.  Skin: Skin is warm and dry. No rash noted. No erythema.  Psychiatric: She has a normal mood and affect. Her behavior is normal. Judgment and thought content normal.          Assessment & Plan:  Well exam. Get fasting labs. Set up another DEXA

## 2011-11-27 ENCOUNTER — Telehealth: Payer: Self-pay | Admitting: Family Medicine

## 2011-11-27 ENCOUNTER — Encounter: Payer: Self-pay | Admitting: Family Medicine

## 2011-11-27 NOTE — Telephone Encounter (Signed)
Pt wanted to give info on sister's medicall history- CAD, COPD, Agglutinin, Auto Immune, Hemolytic, Anemia. I listed these in the family medical history.

## 2011-11-27 NOTE — Telephone Encounter (Signed)
noted 

## 2011-11-30 ENCOUNTER — Ambulatory Visit (INDEPENDENT_AMBULATORY_CARE_PROVIDER_SITE_OTHER): Payer: Medicare Other | Admitting: Gastroenterology

## 2011-11-30 ENCOUNTER — Encounter: Payer: Self-pay | Admitting: Gastroenterology

## 2011-11-30 ENCOUNTER — Encounter: Payer: Self-pay | Admitting: Family Medicine

## 2011-11-30 VITALS — BP 110/80 | HR 60 | Ht <= 58 in | Wt 109.0 lb

## 2011-11-30 DIAGNOSIS — K648 Other hemorrhoids: Secondary | ICD-10-CM

## 2011-11-30 NOTE — Patient Instructions (Signed)
Follow up as needed

## 2011-11-30 NOTE — Progress Notes (Signed)
Quick Note:  Left voice message at home phone and put a copy of results in mail. ______

## 2011-11-30 NOTE — Progress Notes (Signed)
History of Present Illness:  [name] has returned following band ligation of her hemorrhoids. She is feeling well. She has no further rectal bleeding or pain.    Review of Systems: Pertinent positive and negative review of systems were noted in the above HPI section. All other review of systems were otherwise negative.    Current Medications, Allergies, Past Medical History, Past Surgical History, Family History and Social History were reviewed in [employer] electronic medical record  Vital signs were reviewed in today's medical record. Physical Exam: General: Well developed , well nourished, no acute distress

## 2011-11-30 NOTE — Assessment & Plan Note (Signed)
Status post band ligation in January, 2013 with excellent response.

## 2011-12-03 ENCOUNTER — Ambulatory Visit (INDEPENDENT_AMBULATORY_CARE_PROVIDER_SITE_OTHER): Payer: Medicare Other | Admitting: *Deleted

## 2011-12-03 DIAGNOSIS — I4891 Unspecified atrial fibrillation: Secondary | ICD-10-CM

## 2011-12-17 ENCOUNTER — Ambulatory Visit (INDEPENDENT_AMBULATORY_CARE_PROVIDER_SITE_OTHER): Payer: Medicare Other

## 2011-12-17 DIAGNOSIS — I4891 Unspecified atrial fibrillation: Secondary | ICD-10-CM

## 2011-12-31 ENCOUNTER — Ambulatory Visit (INDEPENDENT_AMBULATORY_CARE_PROVIDER_SITE_OTHER): Payer: Medicare Other | Admitting: Pharmacist

## 2011-12-31 DIAGNOSIS — I4891 Unspecified atrial fibrillation: Secondary | ICD-10-CM

## 2012-01-21 ENCOUNTER — Ambulatory Visit (INDEPENDENT_AMBULATORY_CARE_PROVIDER_SITE_OTHER): Payer: Medicare Other | Admitting: Pharmacist

## 2012-01-21 DIAGNOSIS — I4891 Unspecified atrial fibrillation: Secondary | ICD-10-CM

## 2012-02-12 ENCOUNTER — Ambulatory Visit (INDEPENDENT_AMBULATORY_CARE_PROVIDER_SITE_OTHER): Payer: Medicare Other | Admitting: Cardiology

## 2012-02-12 ENCOUNTER — Encounter: Payer: Self-pay | Admitting: Cardiology

## 2012-02-12 VITALS — BP 140/92 | HR 80 | Ht <= 58 in | Wt 105.0 lb

## 2012-02-12 DIAGNOSIS — I4891 Unspecified atrial fibrillation: Secondary | ICD-10-CM

## 2012-02-12 DIAGNOSIS — I10 Essential (primary) hypertension: Secondary | ICD-10-CM

## 2012-02-12 MED ORDER — ATENOLOL 50 MG PO TABS: 75.0000 mg | ORAL_TABLET | Freq: Two times a day (BID) | ORAL | Status: DC

## 2012-02-12 MED ORDER — ATENOLOL 25 MG PO TABS: 25.0000 mg | ORAL_TABLET | Freq: Two times a day (BID) | ORAL | Status: DC

## 2012-02-12 MED ORDER — ATENOLOL 50 MG PO TABS: 50.0000 mg | ORAL_TABLET | Freq: Two times a day (BID) | ORAL | Status: DC

## 2012-02-12 NOTE — Patient Instructions (Signed)
Your physician has recommended you make the following change in your medication: Increase Atenolol to 75 mg twice a day.    Your physician has requested that you regularly monitor and record your blood pressure readings at home. Please use the same machine at the same time of day to check your readings and record them to bring to your follow-up visit.  Your physician wants you to follow-up in: 6 months. You will receive a reminder letter in the mail two months in advance. If you don't receive a letter, please call our office to schedule the follow-up appointment.

## 2012-02-12 NOTE — Progress Notes (Signed)
HPI [name] comes in today for close followup of her hypertension and A. fib.  She bought a new cuff since I recommendations. She's only checked her pressure a couple times, however, and is still running around 160 over about 90-100. However, she is in A. fib and is probably over estimating it as we discussed today.  Past Medical History  Diagnosis Date  . Atrial fibrillation     sees Dr. Juanito Doom  . Hypertension   . Hyperlipidemia   . Edema leg   . Contact dermatitis and other eczema, due to unspecified cause   . Urinary tract infection, site not specified     Dr. Vonita Moss sees pt  . Urticaria, unspecified   . Lumbago     chronic low back pain  . Gout, unspecified   . Unspecified hypothyroidism   . H/O: CVA (cardiovascular accident) 2008  . Osteoarthrosis, unspecified whether generalized or localized, unspecified site   . Diverticulosis of colon (without mention of hemorrhage)   . Osteoporosis     las DEXA on 01/30/10  . History of melanoma     on right ear  . Skin cancer     forehead  . Blood clotting disorder     Current Outpatient Prescriptions  Medication Sig Dispense Refill  . allopurinol (ZYLOPRIM) 300 MG tablet Take 1 tablet (300 mg total) by mouth daily.  90 tablet  3  . atenolol (TENORMIN) 50 MG tablet Take 1 tablet (50 mg total) by mouth 2 (two) times daily.  180 tablet  3  . atorvastatin (LIPITOR) 40 MG tablet Take 1 tablet (40 mg total) by mouth daily.  90 tablet  3  . Calcium-Vitamin D-Vitamin K (CALCIUM SOFT CHEWS PO) Take 1,000 mg by mouth daily.        . Cholecalciferol (VITAMIN D3) 2000 UNITS TABS Take 1 tablet by mouth daily.        . digoxin (LANOXIN) 0.125 MG tablet Take 1 tablet (125 mcg total) by mouth daily.  30 tablet  11  . furosemide (LASIX) 20 MG tablet Take 1 tablet (20 mg total) by mouth daily.  90 tablet  3  . ibandronate (BONIVA) 150 MG tablet Take 1 tablet (150 mg total) by mouth every 30 (thirty) days. Take in the morning with a full  glass of water, on an empty stomach, and do not take anything else by mouth or lie down for the next 30 min.  3 tablet  3  . levothyroxine (SYNTHROID, LEVOTHROID) 50 MCG tablet Take 1 tablet (50 mcg total) by mouth daily.  90 tablet  3  . losartan (COZAAR) 100 MG tablet Take 1 tablet (100 mg total) by mouth daily.  90 tablet  3  . oxyCODONE-acetaminophen (PERCOCET) 5-325 MG per tablet Take 1 tablet by mouth every 4 (four) hours as needed for pain. 1-2 tablets every 6 hours as needed for pain   240 tablet  0  . potassium chloride SA (K-DUR,KLOR-CON) 20 MEQ tablet Take 1 tablet (20 mEq total) by mouth daily. Taking one every other day  90 tablet  3  . Pyridoxine HCl (VITAMIN B-6) 500 MG tablet Take 500 mg by mouth daily.        [name] warfarin (COUMADIN) 5 MG tablet 5 mg. Take 1/2 tablet mon, wed, fri and all other days 5 mg      . DISCONTD: atenolol (TENORMIN) 50 MG tablet Take 1 tablet (50 mg total) by mouth 2 (two) times daily.  180  tablet  3  . DISCONTD: atenolol (TENORMIN) 50 MG tablet Take 1.5 tablets (75 mg total) by mouth 2 (two) times daily.  270 tablet  3  . atenolol (TENORMIN) 25 MG tablet Take 1 tablet (25 mg total) by mouth 2 (two) times daily.  180 tablet  3    Allergies  Allergen Reactions  . Amlodipine     Has significant edema  . Amoxicillin   . Hydrocodone-Acetaminophen   . Nitrofurantoin   . Sulfonamide Derivatives     Family History  Problem Relation Age of Onset  . Colon polyps Mother   . Colon cancer Neg Hx   . Coronary artery disease      sister  . COPD      sister  . Immunodeficiency      sister  . Anemia      sister    History   Social History  . Marital Status: Widowed    Spouse Name: N/A    Number of Children: 3  . Years of Education: N/A   Occupational History  . retired    Social History Main Topics  . Smoking status: Former Smoker    Quit date: 10/05/1994  . Smokeless tobacco: Never Used  . Alcohol Use: No  . Drug Use: No  . Sexually  Active: No   Other Topics Concern  . Not on file   Social History Narrative  . No narrative on file    ROS ALL NEGATIVE EXCEPT THOSE NOTED IN HPI  PE  General Appearance: well developed, well nourished in no acute distress HEENT: symmetrical face, PERRLA, good dentition  Neck: no JVD, thyromegaly, or adenopathy, trachea midline Chest: symmetric without deformity Cardiac: PMI non-displaced, IRR, normal S1, S2, no gallop or murmur Lung: clear to ausculation and percussion Vascular: all pulses full without bruits  Abdominal: nondistended, nontender, good bowel sounds, no HSM, no bruits Extremities: no cyanosis, clubbing or edema, no sign of DVT, no varicosities  Skin: normal color, no rashes Neuro: alert and oriented x 3, non-focal Pysch: normal affect  EKG  BMET    Component Value Date/Time   NA 138 11/26/2011 1104   K 3.6 11/26/2011 1104   CL 103 11/26/2011 1104   CO2 27 11/26/2011 1104   GLUCOSE 94 11/26/2011 1104   BUN 8 11/26/2011 1104   CREATININE 0.7 11/26/2011 1104   CALCIUM 9.5 11/26/2011 1104   GFRNONAA 79.81 08/06/2010 0944   GFRAA 89 08/28/2008 1445    Lipid Panel     Component Value Date/Time   CHOL 116 11/26/2011 1104   TRIG 88.0 11/26/2011 1104   HDL 33.90* 11/26/2011 1104   CHOLHDL 3 11/26/2011 1104   VLDL 17.6 11/26/2011 1104   LDLCALC 65 11/26/2011 1104    CBC    Component Value Date/Time   WBC 6.6 11/26/2011 1104   RBC 4.09 11/26/2011 1104   HGB 12.9 11/26/2011 1104   HCT 39.9 11/26/2011 1104   PLT 239.0 11/26/2011 1104   MCV 97.6 11/26/2011 1104   MCHC 32.4 11/26/2011 1104   RDW 17.0* 11/26/2011 1104   LYMPHSABS 1.8 11/26/2011 1104   MONOABS 0.6 11/26/2011 1104   EOSABS 0.2 11/26/2011 1104   BASOSABS 0.1 11/26/2011 1104

## 2012-02-12 NOTE — Assessment & Plan Note (Signed)
I rechecked her pressure with a manual cuff. I obtained 130/70. Again, I think her automated cuff is over estimating her pressure with her being in A. Fib.  Her heart rate is around 90-100 today. I have increased her atenolol to 75 mg twice a day. No changes made. Her last digoxin level was 1.4.

## 2012-02-12 NOTE — Assessment & Plan Note (Signed)
As mentioned above, her rates a little fast today. I've increased her atenolol to 75 mg twice a day.

## 2012-02-18 ENCOUNTER — Ambulatory Visit (INDEPENDENT_AMBULATORY_CARE_PROVIDER_SITE_OTHER): Payer: Medicare Other

## 2012-02-18 DIAGNOSIS — I4891 Unspecified atrial fibrillation: Secondary | ICD-10-CM

## 2012-02-18 LAB — POCT INR: INR: 2.2

## 2012-03-22 ENCOUNTER — Ambulatory Visit (INDEPENDENT_AMBULATORY_CARE_PROVIDER_SITE_OTHER)
Admission: RE | Admit: 2012-03-22 | Discharge: 2012-03-22 | Disposition: A | Discharge status: Home / Self Care | Payer: Medicare Other | Source: Ambulatory Visit

## 2012-03-22 DIAGNOSIS — M810 Age-related osteoporosis without current pathological fracture: Secondary | ICD-10-CM

## 2012-04-01 ENCOUNTER — Ambulatory Visit (INDEPENDENT_AMBULATORY_CARE_PROVIDER_SITE_OTHER): Payer: Medicare Other

## 2012-04-01 DIAGNOSIS — I4891 Unspecified atrial fibrillation: Secondary | ICD-10-CM

## 2012-04-11 ENCOUNTER — Telehealth: Payer: Self-pay | Admitting: Family Medicine

## 2012-04-11 NOTE — Telephone Encounter (Signed)
Pt called and she is requesting results from bone density scan. I will call with results once the doctor has reviewed.

## 2012-04-12 NOTE — Progress Notes (Signed)
Quick Note:  I spoke with pt ______ 

## 2012-04-12 NOTE — Telephone Encounter (Signed)
I spoke with pt and gave results. She needs a refill on Boniva and a 3 month supply to [facility].

## 2012-04-13 MED ORDER — IBANDRONATE SODIUM 150 MG PO TABS: 150.0000 mg | ORAL_TABLET | ORAL | Status: DC

## 2012-04-13 NOTE — Telephone Encounter (Signed)
I printed script and faxed to [facility].

## 2012-05-13 ENCOUNTER — Ambulatory Visit (INDEPENDENT_AMBULATORY_CARE_PROVIDER_SITE_OTHER): Payer: Medicare Other | Admitting: *Deleted

## 2012-05-13 DIAGNOSIS — I4891 Unspecified atrial fibrillation: Secondary | ICD-10-CM

## 2012-06-07 ENCOUNTER — Telehealth: Payer: Self-pay | Admitting: Family Medicine

## 2012-06-07 MED ORDER — OXYCODONE-ACETAMINOPHEN 5-325 MG PO TABS: 1.0000 | ORAL_TABLET | ORAL | Status: DC | PRN

## 2012-06-07 NOTE — Telephone Encounter (Signed)
done

## 2012-06-07 NOTE — Telephone Encounter (Signed)
Pt called req refill of oxyCODONE-acetaminophen (PERCOCET) 5-325 MG per tablet . Lv detailed message when script is ready for pick up if pt not avail.

## 2012-06-07 NOTE — Telephone Encounter (Signed)
Rx ready for pick up and patient is aware 

## 2012-06-24 ENCOUNTER — Ambulatory Visit (INDEPENDENT_AMBULATORY_CARE_PROVIDER_SITE_OTHER): Payer: Medicare Other | Admitting: *Deleted

## 2012-06-24 DIAGNOSIS — I4891 Unspecified atrial fibrillation: Secondary | ICD-10-CM

## 2012-06-28 ENCOUNTER — Ambulatory Visit (INDEPENDENT_AMBULATORY_CARE_PROVIDER_SITE_OTHER): Payer: Medicare Other

## 2012-06-28 DIAGNOSIS — Z23 Encounter for immunization: Secondary | ICD-10-CM

## 2012-07-07 ENCOUNTER — Telehealth: Payer: Self-pay | Admitting: Cardiology

## 2012-07-07 NOTE — Telephone Encounter (Signed)
OK to stop.

## 2012-07-07 NOTE — Telephone Encounter (Signed)
New Problem:    Patient called in because she is going to have a tooth pulled and wanted to speak with you about her coumadin.  Please call back.

## 2012-07-07 NOTE — Telephone Encounter (Signed)
Pt is having a tooth pulled on 07/28/12 "If it doesn't fall out before then"  Her dentist has Clindamycin ordered pre procedure for her.  Would like to know if she can stop her Coumadin 3 days prior as she did with her colonoscopy last year. States also her dentist seemed concerned about her Atenolol.  Forwarded to Dr. Daleen Squibb for review. Mylo Red RN

## 2012-07-21 NOTE — Telephone Encounter (Signed)
F/u  When should she stop / start her coumadin.

## 2012-07-21 NOTE — Telephone Encounter (Signed)
I spoke with pt and she will stop her Coumadin 3 days prior & restart same day as procedure if approved by her dentist.  This is an upper front tooth.  Mylo Red RN

## 2012-08-05 ENCOUNTER — Ambulatory Visit (INDEPENDENT_AMBULATORY_CARE_PROVIDER_SITE_OTHER): Payer: Medicare Other

## 2012-08-05 DIAGNOSIS — I4891 Unspecified atrial fibrillation: Secondary | ICD-10-CM

## 2012-08-05 LAB — POCT INR: INR: 1.6

## 2012-08-19 ENCOUNTER — Ambulatory Visit (INDEPENDENT_AMBULATORY_CARE_PROVIDER_SITE_OTHER): Payer: Medicare Other | Admitting: *Deleted

## 2012-08-19 DIAGNOSIS — I4891 Unspecified atrial fibrillation: Secondary | ICD-10-CM

## 2012-09-02 ENCOUNTER — Ambulatory Visit (INDEPENDENT_AMBULATORY_CARE_PROVIDER_SITE_OTHER): Payer: Medicare Other

## 2012-09-02 ENCOUNTER — Encounter: Payer: Self-pay | Admitting: Family Medicine

## 2012-09-02 DIAGNOSIS — I4891 Unspecified atrial fibrillation: Secondary | ICD-10-CM

## 2012-09-02 LAB — POCT INR: INR: 1.5

## 2012-09-16 ENCOUNTER — Ambulatory Visit (INDEPENDENT_AMBULATORY_CARE_PROVIDER_SITE_OTHER): Payer: Medicare Other | Admitting: *Deleted

## 2012-09-16 DIAGNOSIS — I4891 Unspecified atrial fibrillation: Secondary | ICD-10-CM

## 2012-10-03 ENCOUNTER — Other Ambulatory Visit: Payer: Self-pay | Admitting: Nurse Practitioner

## 2012-10-07 ENCOUNTER — Ambulatory Visit (INDEPENDENT_AMBULATORY_CARE_PROVIDER_SITE_OTHER): Payer: Medicare Other | Admitting: *Deleted

## 2012-10-07 DIAGNOSIS — I4891 Unspecified atrial fibrillation: Secondary | ICD-10-CM

## 2012-10-28 ENCOUNTER — Ambulatory Visit (INDEPENDENT_AMBULATORY_CARE_PROVIDER_SITE_OTHER): Payer: Medicare Other | Admitting: *Deleted

## 2012-10-28 DIAGNOSIS — I4891 Unspecified atrial fibrillation: Secondary | ICD-10-CM

## 2012-10-28 LAB — POCT INR: INR: 2.5

## 2012-11-25 ENCOUNTER — Ambulatory Visit (INDEPENDENT_AMBULATORY_CARE_PROVIDER_SITE_OTHER): Payer: Medicare Other

## 2012-11-25 DIAGNOSIS — I4891 Unspecified atrial fibrillation: Secondary | ICD-10-CM

## 2012-11-25 LAB — POCT INR: INR: 2.6

## 2012-11-28 ENCOUNTER — Other Ambulatory Visit: Payer: Self-pay | Admitting: Nurse Practitioner

## 2012-12-15 ENCOUNTER — Telehealth: Payer: Self-pay | Admitting: Family Medicine

## 2012-12-15 ENCOUNTER — Ambulatory Visit (INDEPENDENT_AMBULATORY_CARE_PROVIDER_SITE_OTHER): Payer: Medicare Other | Admitting: Family Medicine

## 2012-12-15 ENCOUNTER — Encounter: Payer: Self-pay | Admitting: Family Medicine

## 2012-12-15 VITALS — BP 124/80 | HR 88 | Temp 97.5°F | Ht 60.0 in | Wt 96.0 lb

## 2012-12-15 DIAGNOSIS — Z0000 Encounter for general adult medical examination without abnormal findings: Secondary | ICD-10-CM

## 2012-12-15 DIAGNOSIS — M109 Gout, unspecified: Secondary | ICD-10-CM

## 2012-12-15 DIAGNOSIS — E039 Hypothyroidism, unspecified: Secondary | ICD-10-CM

## 2012-12-15 LAB — HEPATIC FUNCTION PANEL
ALT: 21 U/L (ref 0–35)
AST: 39 U/L — ABNORMAL HIGH (ref 0–37)
Total Bilirubin: 0.9 mg/dL (ref 0.3–1.2)
Total Protein: 8.6 g/dL — ABNORMAL HIGH (ref 6.0–8.3)

## 2012-12-15 LAB — POCT URINALYSIS DIPSTICK
Ketones, UA: NEGATIVE
Protein, UA: NEGATIVE
Spec Grav, UA: 1.01
Urobilinogen, UA: 1
pH, UA: 6.5

## 2012-12-15 LAB — CBC WITH DIFFERENTIAL/PLATELET
Basophils Absolute: 0.1 10*3/uL (ref 0.0–0.1)
Eosinophils Absolute: 0.2 10*3/uL (ref 0.0–0.7)
HCT: 41.1 % (ref 36.0–46.0)
Hemoglobin: 13.5 g/dL (ref 12.0–15.0)
Lymphs Abs: 2.1 10*3/uL (ref 0.7–4.0)
MCHC: 32.9 g/dL (ref 30.0–36.0)
Monocytes Absolute: 0.7 10*3/uL (ref 0.1–1.0)
Neutro Abs: 5.1 10*3/uL (ref 1.4–7.7)
Platelets: 228 10*3/uL (ref 150.0–400.0)
RDW: 15.5 % — ABNORMAL HIGH (ref 11.5–14.6)

## 2012-12-15 LAB — BASIC METABOLIC PANEL
BUN: 12 mg/dL (ref 6–23)
CO2: 28 mEq/L (ref 19–32)
Chloride: 100 mEq/L (ref 96–112)
Creatinine, Ser: 0.8 mg/dL (ref 0.4–1.2)
Potassium: 3.9 mEq/L (ref 3.5–5.1)

## 2012-12-15 LAB — TSH: TSH: 3.91 u[IU]/mL (ref 0.35–5.50)

## 2012-12-15 LAB — LIPID PANEL
Cholesterol: 123 mg/dL (ref 0–200)
Triglycerides: 70 mg/dL (ref 0.0–149.0)

## 2012-12-15 MED ORDER — TRAMADOL HCL 50 MG PO TABS: ORAL_TABLET | ORAL | Status: DC

## 2012-12-15 MED ORDER — FUROSEMIDE 20 MG PO TABS: 20.0000 mg | ORAL_TABLET | Freq: Every day | ORAL | Status: DC

## 2012-12-15 MED ORDER — LEVOTHYROXINE SODIUM 50 MCG PO TABS: 50.0000 ug | ORAL_TABLET | Freq: Every day | ORAL | Status: DC

## 2012-12-15 MED ORDER — POTASSIUM CHLORIDE CRYS ER 20 MEQ PO TBCR: 20.0000 meq | EXTENDED_RELEASE_TABLET | Freq: Every day | ORAL | Status: DC

## 2012-12-15 MED ORDER — ALLOPURINOL 300 MG PO TABS: 300.0000 mg | ORAL_TABLET | Freq: Every day | ORAL | Status: DC

## 2012-12-15 MED ORDER — IBANDRONATE SODIUM 150 MG PO TABS: 150.0000 mg | ORAL_TABLET | ORAL | Status: DC

## 2012-12-15 NOTE — Progress Notes (Signed)
  Subjective:    Patient ID: [name], female    DOB: March 19, 1926, 79 y.o.   MRN: [id number]  HPI 79 yr old female for a cpx. She feels well in general except for her arthritis pains. We have told her she can take Tylenol if needed but she usually does not. She sleeps well with a Percocet at bedtime, but she has strange dreams sometimes.    Review of Systems  Constitutional: Negative.   HENT: Negative.   Eyes: Negative.   Respiratory: Negative.   Cardiovascular: Negative.   Gastrointestinal: Negative.   Genitourinary: Negative for dysuria, urgency, frequency, hematuria, flank pain, decreased urine volume, enuresis, difficulty urinating, pelvic pain and dyspareunia.  Musculoskeletal: Negative.   Skin: Negative.   Neurological: Negative.   Psychiatric/Behavioral: Negative.        Objective:   Physical Exam  Constitutional: She is oriented to person, place, and time. She appears well-developed and well-nourished. No distress.  HENT:  Head: Normocephalic and atraumatic.  Right Ear: External ear normal.  Left Ear: External ear normal.  Nose: Nose normal.  Mouth/Throat: Oropharynx is clear and moist. No oropharyngeal exudate.  Eyes: Conjunctivae and EOM are normal. Pupils are equal, round, and reactive to light. No scleral icterus.  Neck: Normal range of motion. Neck supple. No JVD present. No thyromegaly present.  Cardiovascular: Normal rate, normal heart sounds and intact distal pulses.  Exam reveals no gallop and no friction rub.   No murmur heard. Irregular rhythm. EKG is at her baseline with atrial fibrillation, a controlled ventricular rate, a single PVC, and inferior and lateral T wave changes   Pulmonary/Chest: Effort normal and breath sounds normal. No respiratory distress. She has no wheezes. She has no rales. She exhibits no tenderness.  Abdominal: Soft. Bowel sounds are normal. She exhibits no distension and no mass. There is no tenderness. There is no rebound and no  guarding.  Musculoskeletal: Normal range of motion. She exhibits no edema and no tenderness.  Lymphadenopathy:    She has no cervical adenopathy.  Neurological: She is alert and oriented to person, place, and time. She has normal reflexes. No cranial nerve deficit. She exhibits normal muscle tone. Coordination normal.  Skin: Skin is warm and dry. No rash noted. No erythema.  Psychiatric: She has a normal mood and affect. Her behavior is normal. Judgment and thought content normal.          Assessment & Plan:  Well exam. Get fasting labs. Try Tramadol for pain control at night instead of Percocet.

## 2012-12-15 NOTE — Telephone Encounter (Signed)
Pharmacist stated that the RX for [name] m20 tablets says, "once and day" as well as "every other day". Please assist.

## 2012-12-16 MED ORDER — POTASSIUM CHLORIDE CRYS ER 20 MEQ PO TBCR: 20.0000 meq | EXTENDED_RELEASE_TABLET | Freq: Every day | ORAL | Status: DC

## 2012-12-16 NOTE — Telephone Encounter (Signed)
This should be taken once every day

## 2012-12-16 NOTE — Telephone Encounter (Signed)
I sent new script e-scribe. 

## 2012-12-20 NOTE — Progress Notes (Signed)
Quick Note:  I spoke with pt ______ 

## 2012-12-23 ENCOUNTER — Ambulatory Visit (INDEPENDENT_AMBULATORY_CARE_PROVIDER_SITE_OTHER): Payer: Medicare Other

## 2012-12-23 DIAGNOSIS — I4891 Unspecified atrial fibrillation: Secondary | ICD-10-CM

## 2013-01-10 ENCOUNTER — Encounter: Payer: Self-pay | Admitting: Cardiology

## 2013-01-10 ENCOUNTER — Ambulatory Visit (INDEPENDENT_AMBULATORY_CARE_PROVIDER_SITE_OTHER): Payer: Medicare Other | Admitting: Cardiology

## 2013-01-10 VITALS — BP 110/72 | HR 65 | Ht 60.0 in | Wt 94.0 lb

## 2013-01-10 DIAGNOSIS — R609 Edema, unspecified: Secondary | ICD-10-CM

## 2013-01-10 DIAGNOSIS — I6523 Occlusion and stenosis of bilateral carotid arteries: Secondary | ICD-10-CM

## 2013-01-10 DIAGNOSIS — I6529 Occlusion and stenosis of unspecified carotid artery: Secondary | ICD-10-CM

## 2013-01-10 DIAGNOSIS — I4891 Unspecified atrial fibrillation: Secondary | ICD-10-CM

## 2013-01-10 DIAGNOSIS — I6340 Cerebral infarction due to embolism of unspecified cerebral artery: Secondary | ICD-10-CM

## 2013-01-10 NOTE — Assessment & Plan Note (Addendum)
Asymptomatic with a well-controlled rate. Continue anticoagulation. Fall risk is low. We'll schedule followup with Dr. Shirlee Latch

## 2013-01-10 NOTE — Assessment & Plan Note (Signed)
Repeat carotid Dopplers. 

## 2013-01-10 NOTE — Assessment & Plan Note (Signed)
Continue anticoagulation 

## 2013-01-10 NOTE — Assessment & Plan Note (Signed)
Improved

## 2013-01-10 NOTE — Patient Instructions (Signed)
Your physician wants you to follow-up in: 6 months with Dr. Shirlee Latch.   You will receive a reminder letter in the mail two months in advance. If you don't receive a letter, please call our office to schedule the follow-up appointment.  Your physician has requested that you have a carotid duplex. This test is an ultrasound of the carotid arteries in your neck. It looks at blood flow through these arteries that supply the brain with blood. Allow one hour for this exam. There are no restrictions or special instructions.

## 2013-01-10 NOTE — Progress Notes (Signed)
HPI  [name] returns today for evaluation and management of her chronic A. fib, anticoagulation, and carotid artery disease.  She's been doing well. She denies any palpitations, presyncope, syncope, chest pain, orthopnea, PND, edema, or symptoms of TIAs. He's very compliant with her medications.  Past Medical History  Diagnosis Date  . Atrial fibrillation     sees Dr. Juanito Doom  . Hypertension   . Hyperlipidemia   . Edema leg   . Contact dermatitis and other eczema, due to unspecified cause   . Urinary tract infection, site not specified     Dr. Vonita Moss sees pt  . Urticaria, unspecified   . Lumbago     chronic low back pain  . Gout, unspecified   . Unspecified hypothyroidism   . H/O: CVA (cardiovascular accident) 2008  . Osteoarthrosis, unspecified whether generalized or localized, unspecified site   . Diverticulosis of colon (without mention of hemorrhage)   . Osteoporosis     las DEXA on 01/30/10  . History of melanoma     on right ear  . Skin cancer     forehead  . Blood clotting disorder     Current Outpatient Prescriptions  Medication Sig Dispense Refill  . allopurinol (ZYLOPRIM) 300 MG tablet Take 1 tablet (300 mg total) by mouth daily.  90 tablet  3  . atenolol (TENORMIN) 25 MG tablet Take 1 tablet (25 mg total) by mouth 2 (two) times daily.  180 tablet  3  . atenolol (TENORMIN) 50 MG tablet Take 1 tablet (50 mg total) by mouth 2 (two) times daily.  180 tablet  3  . atorvastatin (LIPITOR) 40 MG tablet TAKE 1 TABLET (40 MG TOTAL) BY MOUTH DAILY.  90 tablet  3  . Calcium-Vitamin D-Vitamin K (CALCIUM SOFT CHEWS PO) Take 1,000 mg by mouth daily.        . Cholecalciferol (VITAMIN D3) 2000 UNITS TABS Take 1 tablet by mouth daily.        . digoxin (LANOXIN) 0.125 MG tablet TAKE 1 TABLET (125 MCG TOTAL) BY MOUTH DAILY.  30 tablet  9  . furosemide (LASIX) 20 MG tablet Take 1 tablet (20 mg total) by mouth daily.  90 tablet  3  . ibandronate (BONIVA) 150 MG tablet Take 1  tablet (150 mg total) by mouth every 30 (thirty) days. Take in the morning with a full glass of water, on an empty stomach, and do not take anything else by mouth or lie down for the next 30 min.  3 tablet  3  . levothyroxine (SYNTHROID, LEVOTHROID) 50 MCG tablet Take 1 tablet (50 mcg total) by mouth daily.  90 tablet  3  . losartan (COZAAR) 100 MG tablet Take 1 tablet (100 mg total) by mouth daily.  90 tablet  3  . Pyridoxine HCl (VITAMIN B-6) 500 MG tablet Take 500 mg by mouth daily.        . traMADol (ULTRAM) 50 MG tablet Take 1 or 2 tablets every 6 hours as needed for pain  180 tablet  1  . warfarin (COUMADIN) 5 MG tablet 5 mg. Take 1/2 tablet mon& fri and all other days 5 mg all other days      . potassium chloride SA (K-DUR,KLOR-CON) 20 MEQ tablet Take 1 tablet (20 mEq total) by mouth daily.  90 tablet  3   No current facility-administered medications for this visit.    Allergies  Allergen Reactions  . Amlodipine     Has significant  edema  . Amoxicillin   . Hydrocodone-Acetaminophen   . Nitrofurantoin   . Sulfonamide Derivatives     Family History  Problem Relation Age of Onset  . Colon polyps Mother   . Colon cancer Neg Hx   . Coronary artery disease      sister  . COPD      sister  . Immunodeficiency      sister  . Anemia      sister    History   Social History  . Marital Status: Widowed    Spouse Name: N/A    Number of Children: 3  . Years of Education: N/A   Occupational History  . retired    Social History Main Topics  . Smoking status: Former Smoker    Quit date: 10/05/1994  . Smokeless tobacco: Never Used  . Alcohol Use: No  . Drug Use: No  . Sexually Active: No   Other Topics Concern  . Not on file   Social History Narrative  . No narrative on file    ROS ALL NEGATIVE EXCEPT THOSE NOTED IN HPI  PE  General Appearance: well developed, well nourished in no acute distress HEENT: symmetrical face, PERRLA, good dentition  Neck: no JVD,  thyromegaly, or adenopathy, trachea midline Chest: symmetric without deformity Cardiac: PMI non-displaced, irregular rate and rhythm, normal S1, S2, no gallop or murmur Lung: clear to ausculation and percussion Vascular: all pulses full without bruits  Abdominal: nondistended, nontender, good bowel sounds, no HSM, no bruits Extremities: no cyanosis, clubbing or edema, no sign of DVT, no varicosities  Skin: normal color, no rashes Neuro: alert and oriented x 3, non-focal Pysch: normal affect  EKG  BMET    Component Value Date/Time   NA 138 12/15/2012 1004   K 3.9 12/15/2012 1004   CL 100 12/15/2012 1004   CO2 28 12/15/2012 1004   GLUCOSE 90 12/15/2012 1004   BUN 12 12/15/2012 1004   CREATININE 0.8 12/15/2012 1004   CALCIUM 9.7 12/15/2012 1004   GFRNONAA 79.81 08/06/2010 0944   GFRAA 89 08/28/2008 1445    Lipid Panel     Component Value Date/Time   CHOL 123 12/15/2012 1004   TRIG 70.0 12/15/2012 1004   HDL 31.40* 12/15/2012 1004   CHOLHDL 4 12/15/2012 1004   VLDL 14.0 12/15/2012 1004   LDLCALC 78 12/15/2012 1004    CBC    Component Value Date/Time   WBC 8.1 12/15/2012 1004   RBC 4.16 12/15/2012 1004   HGB 13.5 12/15/2012 1004   HCT 41.1 12/15/2012 1004   PLT 228.0 12/15/2012 1004   MCV 98.8 12/15/2012 1004   MCHC 32.9 12/15/2012 1004   RDW 15.5* 12/15/2012 1004   LYMPHSABS 2.1 12/15/2012 1004   MONOABS 0.7 12/15/2012 1004   EOSABS 0.2 12/15/2012 1004   BASOSABS 0.1 12/15/2012 1004

## 2013-01-13 ENCOUNTER — Other Ambulatory Visit: Payer: Self-pay | Admitting: *Deleted

## 2013-01-13 MED ORDER — DIGOXIN 125 MCG PO TABS: 0.1250 mg | ORAL_TABLET | Freq: Every day | ORAL | Status: DC

## 2013-01-13 NOTE — Telephone Encounter (Signed)
Fax Received. Refill Completed. [name] (R.M.A)   

## 2013-01-17 ENCOUNTER — Encounter (INDEPENDENT_AMBULATORY_CARE_PROVIDER_SITE_OTHER): Payer: Medicare Other

## 2013-01-17 DIAGNOSIS — I6523 Occlusion and stenosis of bilateral carotid arteries: Secondary | ICD-10-CM

## 2013-01-17 DIAGNOSIS — I6529 Occlusion and stenosis of unspecified carotid artery: Secondary | ICD-10-CM

## 2013-01-20 ENCOUNTER — Ambulatory Visit (INDEPENDENT_AMBULATORY_CARE_PROVIDER_SITE_OTHER): Payer: Medicare Other | Admitting: *Deleted

## 2013-01-20 DIAGNOSIS — I4891 Unspecified atrial fibrillation: Secondary | ICD-10-CM

## 2013-01-20 LAB — POCT INR: INR: 2.3

## 2013-01-24 ENCOUNTER — Other Ambulatory Visit: Payer: Self-pay | Admitting: Family Medicine

## 2013-02-17 ENCOUNTER — Ambulatory Visit (INDEPENDENT_AMBULATORY_CARE_PROVIDER_SITE_OTHER): Payer: Medicare Other | Admitting: *Deleted

## 2013-02-17 DIAGNOSIS — I4891 Unspecified atrial fibrillation: Secondary | ICD-10-CM

## 2013-03-17 ENCOUNTER — Ambulatory Visit (INDEPENDENT_AMBULATORY_CARE_PROVIDER_SITE_OTHER): Payer: Medicare Other | Admitting: *Deleted

## 2013-03-17 DIAGNOSIS — I4891 Unspecified atrial fibrillation: Secondary | ICD-10-CM

## 2013-03-19 ENCOUNTER — Other Ambulatory Visit: Payer: Self-pay | Admitting: Cardiology

## 2013-03-31 ENCOUNTER — Ambulatory Visit (INDEPENDENT_AMBULATORY_CARE_PROVIDER_SITE_OTHER): Payer: Medicare Other | Admitting: *Deleted

## 2013-03-31 DIAGNOSIS — I4891 Unspecified atrial fibrillation: Secondary | ICD-10-CM

## 2013-04-21 ENCOUNTER — Ambulatory Visit (INDEPENDENT_AMBULATORY_CARE_PROVIDER_SITE_OTHER): Payer: Medicare Other | Admitting: *Deleted

## 2013-04-21 DIAGNOSIS — I4891 Unspecified atrial fibrillation: Secondary | ICD-10-CM

## 2013-04-24 ENCOUNTER — Other Ambulatory Visit: Payer: Self-pay | Admitting: Cardiology

## 2013-05-19 ENCOUNTER — Ambulatory Visit (INDEPENDENT_AMBULATORY_CARE_PROVIDER_SITE_OTHER): Payer: Medicare Other | Admitting: *Deleted

## 2013-05-19 DIAGNOSIS — I4891 Unspecified atrial fibrillation: Secondary | ICD-10-CM

## 2013-06-16 ENCOUNTER — Ambulatory Visit (INDEPENDENT_AMBULATORY_CARE_PROVIDER_SITE_OTHER): Payer: Medicare Other | Admitting: Pharmacist

## 2013-06-16 DIAGNOSIS — I4891 Unspecified atrial fibrillation: Secondary | ICD-10-CM

## 2013-06-16 LAB — POCT INR: INR: 1.9

## 2013-06-24 ENCOUNTER — Other Ambulatory Visit: Payer: Self-pay | Admitting: Cardiology

## 2013-06-24 ENCOUNTER — Other Ambulatory Visit: Payer: Self-pay | Admitting: Family Medicine

## 2013-06-24 ENCOUNTER — Other Ambulatory Visit: Payer: Self-pay | Admitting: Nurse Practitioner

## 2013-06-26 NOTE — Telephone Encounter (Signed)
Call in #[phone] with one rf  

## 2013-07-04 ENCOUNTER — Ambulatory Visit (INDEPENDENT_AMBULATORY_CARE_PROVIDER_SITE_OTHER): Payer: Medicare Other | Admitting: Family Medicine

## 2013-07-04 DIAGNOSIS — Z23 Encounter for immunization: Secondary | ICD-10-CM

## 2013-07-07 ENCOUNTER — Telehealth: Payer: Self-pay

## 2013-07-07 ENCOUNTER — Ambulatory Visit (INDEPENDENT_AMBULATORY_CARE_PROVIDER_SITE_OTHER): Payer: Medicare Other | Admitting: Pharmacist

## 2013-07-07 DIAGNOSIS — I4891 Unspecified atrial fibrillation: Secondary | ICD-10-CM

## 2013-07-07 LAB — POCT INR: INR: 1.6

## 2013-07-07 NOTE — Telephone Encounter (Signed)
Called and spoke with pt and pt is aware that renewal form for perm disability placard is ready for pick and there is a 20 dollar charge.

## 2013-07-21 ENCOUNTER — Ambulatory Visit (INDEPENDENT_AMBULATORY_CARE_PROVIDER_SITE_OTHER): Payer: Medicare Other | Admitting: *Deleted

## 2013-07-21 DIAGNOSIS — I4891 Unspecified atrial fibrillation: Secondary | ICD-10-CM

## 2013-08-11 ENCOUNTER — Ambulatory Visit (INDEPENDENT_AMBULATORY_CARE_PROVIDER_SITE_OTHER): Payer: Medicare Other | Admitting: Pharmacist

## 2013-08-11 DIAGNOSIS — I4891 Unspecified atrial fibrillation: Secondary | ICD-10-CM

## 2013-08-11 LAB — POCT INR: INR: 2.5

## 2013-08-21 ENCOUNTER — Ambulatory Visit: Payer: Medicare Other | Admitting: Cardiology

## 2013-09-08 ENCOUNTER — Ambulatory Visit (INDEPENDENT_AMBULATORY_CARE_PROVIDER_SITE_OTHER): Payer: Medicare Other | Admitting: *Deleted

## 2013-09-08 DIAGNOSIS — I4891 Unspecified atrial fibrillation: Secondary | ICD-10-CM

## 2013-09-08 LAB — POCT INR: INR: 2.5

## 2013-09-15 ENCOUNTER — Encounter: Payer: Self-pay | Admitting: Family Medicine

## 2013-09-18 ENCOUNTER — Other Ambulatory Visit: Payer: Self-pay | Admitting: *Deleted

## 2013-09-18 MED ORDER — WARFARIN SODIUM 5 MG PO TABS: ORAL_TABLET | ORAL | Status: DC

## 2013-10-10 ENCOUNTER — Encounter: Payer: Self-pay | Admitting: *Deleted

## 2013-10-10 ENCOUNTER — Encounter: Payer: Self-pay | Admitting: Cardiology

## 2013-10-10 ENCOUNTER — Ambulatory Visit (INDEPENDENT_AMBULATORY_CARE_PROVIDER_SITE_OTHER): Payer: Medicare Other | Admitting: Cardiology

## 2013-10-10 ENCOUNTER — Ambulatory Visit (INDEPENDENT_AMBULATORY_CARE_PROVIDER_SITE_OTHER): Payer: Medicare Other | Admitting: *Deleted

## 2013-10-10 VITALS — BP 122/78 | HR 67 | Ht 63.0 in | Wt 99.0 lb

## 2013-10-10 DIAGNOSIS — I6529 Occlusion and stenosis of unspecified carotid artery: Secondary | ICD-10-CM

## 2013-10-10 DIAGNOSIS — R001 Bradycardia, unspecified: Secondary | ICD-10-CM

## 2013-10-10 DIAGNOSIS — E785 Hyperlipidemia, unspecified: Secondary | ICD-10-CM

## 2013-10-10 DIAGNOSIS — I498 Other specified cardiac arrhythmias: Secondary | ICD-10-CM

## 2013-10-10 DIAGNOSIS — I5032 Chronic diastolic (congestive) heart failure: Secondary | ICD-10-CM

## 2013-10-10 DIAGNOSIS — I10 Essential (primary) hypertension: Secondary | ICD-10-CM

## 2013-10-10 DIAGNOSIS — I4891 Unspecified atrial fibrillation: Secondary | ICD-10-CM

## 2013-10-10 DIAGNOSIS — I509 Heart failure, unspecified: Secondary | ICD-10-CM

## 2013-10-10 LAB — BASIC METABOLIC PANEL
BUN: 14 mg/dL (ref 6–23)
CO2: 28 meq/L (ref 19–32)
Calcium: 9.5 mg/dL (ref 8.4–10.5)
Chloride: 98 mEq/L (ref 96–112)
Creatinine, Ser: 0.8 mg/dL (ref 0.4–1.2)
GFR: 73.44 mL/min (ref >60.00)
GLUCOSE: 79 mg/dL (ref 70–99)
Potassium: 3.7 mEq/L (ref 3.5–5.1)
SODIUM: 134 meq/L — AB (ref 135–145)

## 2013-10-10 LAB — POCT INR: INR: 1.9

## 2013-10-10 MED ORDER — METOPROLOL SUCCINATE ER 50 MG PO TB24: ORAL_TABLET | ORAL | Status: DC

## 2013-10-10 NOTE — Patient Instructions (Signed)
Stop Digoxin.   Stop Atenolol.   Start Toprol XL (metoprolol succinate) 75mg  daily. This will be 1 and 1/2 of a 50mg  tablet daily at the same time. You can start this tomorrow.  Your physician recommends that you have  lab work today--BMET.  Your physician has requested that you have an echocardiogram. Echocardiography is a painless test that uses sound waves to create images of your heart. It provides your doctor with information about the size and shape of your heart and how well your heart's chambers and valves are working. This procedure takes approximately one hour. There are no restrictions for this procedure.  Your physician has recommended that you wear a holter monitor. Holter monitors are medical devices that record the heart's electrical activity. Doctors most often use these monitors to diagnose arrhythmias. Arrhythmias are problems with the speed or rhythm of the heartbeat. The monitor is a small, portable device. You can wear one while you do your normal daily activities. This is usually used to diagnose what is causing palpitations/syncope (passing out). 24 hour holter Do not schedule this before you make your medication changes.   Your physician recommends that you schedule a follow-up appointment in: 1 week. You should have holter monitor and echo competed before you see Dr [name] in 1 week.

## 2013-10-10 NOTE — Progress Notes (Signed)
Patient ID: [name], female   DOB: [birth year], 80 y.o.   MRN: [id number] PCP: Dr. Sarajane Jews  80 yo with history of chronic atrial fibrillation and chronic diastolic CHF presents for cardiology followup.  She has seen Dr. Verl Blalock in the past and is seeing me for the first time today.  I reviewed all her old records.  Lately, she has been doing reasonably well.  Main complaint is fatigue.  She has no exertional dyspnea, per se, but tires easily.  She can walk to her mailbox and back.  Her right leg has been week since her 2008 CVA (mildly) so she walks with a cane.  No falls, no lightheadedness, no chest pain.  She lives alone and does some driving.    Today's ECG showed atrial fibrillation with two approximately 2.2 second pauses.   Labs (3/14): K 3.9, creatinine 0.8, LDL 78, HDL 31  PMH: 1. Chronic atrial fibrillation 2. Gout 3. Diastolic CHF: Echo (9/98) with EF 55-60%, mild MR.  4. HTN: Edema with amlodipine . 5. Hyperlipidemia 6. H/o UTIs  7. Chronic low back pain.  8. Hypothyroidism 9. CVA in 2008.  10.  OA 11. Diverticulosis 12. Osteoporosis 13. H/o melanoma 14. Carotid stenosis: Dopplers (4/14) with 40-59% RICA stenosis (has been stable).   SH: Widow, 3 children, lives in [place], prior smoker quit '96.   FH: Sister with CAD  ROS: All systems reviewed and negative except as per HPI.   Current Outpatient Prescriptions  Medication Sig Dispense Refill  . allopurinol (ZYLOPRIM) 300 MG tablet Take 1 tablet (300 mg total) by mouth daily.  90 tablet  3  . atorvastatin (LIPITOR) 40 MG tablet TAKE 1 TABLET (40 MG TOTAL) BY MOUTH DAILY.  90 tablet  3  . Calcium-Vitamin D-Vitamin K (CALCIUM SOFT CHEWS PO) Take 1,000 mg by mouth daily.        . Cholecalciferol (VITAMIN D3) 2000 UNITS TABS Take 1 tablet by mouth daily.        . furosemide (LASIX) 20 MG tablet Take 1 tablet (20 mg total) by mouth daily.  90 tablet  3  . ibandronate (BONIVA) 150 MG tablet Take 1 tablet (150 mg total) by  mouth every 30 (thirty) days. Take in the morning with a full glass of water, on an empty stomach, and do not take anything else by mouth or lie down for the next 30 min.  3 tablet  3  . levothyroxine (SYNTHROID, LEVOTHROID) 50 MCG tablet Take 1 tablet (50 mcg total) by mouth daily.  90 tablet  3  . losartan (COZAAR) 100 MG tablet Take 1 tablet (100 mg total) by mouth daily.  90 tablet  3  . Pyridoxine HCl (VITAMIN B-6) 500 MG tablet Take 500 mg by mouth daily.        . traMADol (ULTRAM) 50 MG tablet TAKE 1 TO 2 TABLETS BY MOUTH EVERY 6 HOURS AS NEEDED FOR PAIN  180 tablet  1  . warfarin (COUMADIN) 5 MG tablet TAKE AS DIRECTED BY ANTICOAGULATION CLINIC  90 tablet  1  . metoprolol succinate (TOPROL XL) 50 MG 24 hr tablet 1 and 1/2 tablets (total 75mg ) daily at the same time. Take with or immediately following a meal.  45 tablet  3  . potassium chloride SA (K-DUR,KLOR-CON) 20 MEQ tablet Take 1 tablet (20 mEq total) by mouth daily.  90 tablet  3   No current facility-administered medications for this visit.    BP 122/78  Pulse  67  Ht 5\' 3"  (1.6 m)  Wt 44.906 kg (99 lb)  BMI 17.54 kg/m2 General: NAD Neck: No JVD, no thyromegaly or thyroid nodule.  Lungs: Clear to auscultation bilaterally with normal respiratory effort. CV: Nondisplaced PMI.  Heart irregular S1/S2, no S3/S4, 1/6 SEM RUSB.  No peripheral edema.  +lower leg varicosities.  No carotid bruit.  Normal pedal pulses.  Abdomen: Soft, nontender, no hepatosplenomegaly, no distention.  Skin: Intact without lesions or rashes.  Neurologic: Alert and oriented x 3.  Psych: Normal affect. Extremities: No clubbing or cyanosis.   Assessment/Plan: 1. Atrial fibrillation: Chronic x years.  She is on warfarin but has had no problems with it and wants to continue it (rather than switching over to a NOAC).  ECG today shows atrial fibrillation with bradycardia and two 2.2 second pauses.  She is on atenolol and digoxin.  I am concered that atenolol  could build up in her system are renal function worsens with age, making her more bradycardic.   - Stop digoxin and atenolol - Start Toprol XL 75 mg daily.  - Followup in 2 wks to assess HR control. - Once she has made the above med changes, she will get a 24 hour holter to look for clinically significant bradycardia.  - She has a history of CVA, so if she has to go off warfarin in the future, will need bridging Lovenox.  2. Chronic diastolic CHF: Patient does not appear volume overloaded on exam.  NYHA class II-III.   - Echocardiogram to assess EF (has not been done for many years).  - Continue current Lasix, check BMET today.  3. Carotid stenosis: Stable x several years.  No followup unless she develops symptoms.   4. Hyperlipidemia: Lipids ok in 3/14.  Goal LDL < 70 with known vascular disease (carotid stenosis).  Continue atorvastatin at current dose.  5. HTN: BP is under reasonable control.    [name] 10/10/2013

## 2013-10-12 ENCOUNTER — Encounter: Payer: Self-pay | Admitting: *Deleted

## 2013-10-12 ENCOUNTER — Encounter (INDEPENDENT_AMBULATORY_CARE_PROVIDER_SITE_OTHER): Payer: Medicare Other

## 2013-10-12 DIAGNOSIS — R001 Bradycardia, unspecified: Secondary | ICD-10-CM

## 2013-10-12 DIAGNOSIS — I4891 Unspecified atrial fibrillation: Secondary | ICD-10-CM

## 2013-10-12 DIAGNOSIS — I495 Sick sinus syndrome: Secondary | ICD-10-CM

## 2013-10-12 NOTE — Progress Notes (Signed)
Patient ID: [name], female   DOB: November 08, 1925, 80 y.o.   MRN: [id number] EVO 24 hour holter monitor applied to patient.

## 2013-10-13 ENCOUNTER — Other Ambulatory Visit: Payer: Self-pay

## 2013-10-16 ENCOUNTER — Ambulatory Visit: Payer: Medicare Other | Admitting: Cardiology

## 2013-10-16 ENCOUNTER — Ambulatory Visit (HOSPITAL_COMMUNITY): Payer: Medicare Other | Attending: Cardiology | Admitting: Cardiology

## 2013-10-16 ENCOUNTER — Encounter: Payer: Self-pay | Admitting: Cardiology

## 2013-10-16 DIAGNOSIS — I079 Rheumatic tricuspid valve disease, unspecified: Secondary | ICD-10-CM | POA: Insufficient documentation

## 2013-10-16 DIAGNOSIS — I509 Heart failure, unspecified: Secondary | ICD-10-CM | POA: Insufficient documentation

## 2013-10-16 DIAGNOSIS — I4891 Unspecified atrial fibrillation: Secondary | ICD-10-CM | POA: Insufficient documentation

## 2013-10-16 DIAGNOSIS — I059 Rheumatic mitral valve disease, unspecified: Secondary | ICD-10-CM | POA: Insufficient documentation

## 2013-10-16 DIAGNOSIS — E785 Hyperlipidemia, unspecified: Secondary | ICD-10-CM | POA: Insufficient documentation

## 2013-10-16 DIAGNOSIS — I10 Essential (primary) hypertension: Secondary | ICD-10-CM | POA: Insufficient documentation

## 2013-10-16 DIAGNOSIS — R001 Bradycardia, unspecified: Secondary | ICD-10-CM

## 2013-10-16 DIAGNOSIS — I5032 Chronic diastolic (congestive) heart failure: Secondary | ICD-10-CM

## 2013-10-16 NOTE — Progress Notes (Signed)
Echo performed. 

## 2013-10-17 ENCOUNTER — Encounter: Payer: Self-pay | Admitting: Cardiology

## 2013-10-17 ENCOUNTER — Ambulatory Visit (INDEPENDENT_AMBULATORY_CARE_PROVIDER_SITE_OTHER): Payer: Medicare Other | Admitting: Cardiology

## 2013-10-17 VITALS — BP 144/78 | HR 84 | Ht 63.0 in | Wt 98.0 lb

## 2013-10-17 DIAGNOSIS — I4891 Unspecified atrial fibrillation: Secondary | ICD-10-CM

## 2013-10-17 DIAGNOSIS — I509 Heart failure, unspecified: Secondary | ICD-10-CM

## 2013-10-17 DIAGNOSIS — I6529 Occlusion and stenosis of unspecified carotid artery: Secondary | ICD-10-CM

## 2013-10-17 DIAGNOSIS — E785 Hyperlipidemia, unspecified: Secondary | ICD-10-CM

## 2013-10-17 DIAGNOSIS — I5032 Chronic diastolic (congestive) heart failure: Secondary | ICD-10-CM

## 2013-10-17 NOTE — Patient Instructions (Signed)
Your physician wants you to follow-up in: 6 months with Dr Aundra Dubin. (July 2015).  You will receive a reminder letter in the mail two months in advance. If you don't receive a letter, please call our office to schedule the follow-up appointment.   Call the office if you develop symptoms of lightheadedness.

## 2013-10-18 NOTE — Progress Notes (Signed)
Patient ID: [name], female   DOB: 1925/11/08, 80 y.o.   MRN: [id number] PCP: Dr. Sarajane Jews  80 yo with history of chronic atrial fibrillation and chronic diastolic CHF presents for cardiology followup.  Lately, she has been doing reasonably well.  Main complaint is fatigue.  She has no exertional dyspnea, per se, but tires easily.  She can walk to her mailbox and back.  Her right leg has been weak since her 2008 CVA (mildly) so she walks with a cane.  No falls, no lightheadedness, no chest pain.  She lives alone and does some driving.  At last appointment, I was worried about pauses noted on her ECG.  I had her stop digoxin and atenolol and start on a lower dose of Toprol XL.  I brought her back today to make sure that she has tolerated the medication changes.  She seems to be doing well.  I had her wear a holter after the medication changes.  This showed average HR 79 with several pauses that were </= 3 seconds.  She denies lightheadedness or syncope.     Labs (3/14): K 3.9, creatinine 0.8, LDL 78, HDL 31 Labs (1/15): K 3.7, creatinine 0.8  PMH: 1. Chronic atrial fibrillation 2. Gout 3. Diastolic CHF: Echo (5/28) with EF 55-60%, mild MR.  Echo (1/15) with EF 55%, probably at least mild AS.  4. HTN: Edema with amlodipine . 5. Hyperlipidemia 6. H/o UTIs  7. Chronic low back pain.  8. Hypothyroidism 9. CVA in 2008.  10.  OA 11. Diverticulosis 12. Osteoporosis 13. H/o melanoma 14. Carotid stenosis: Dopplers (4/14) with 40-59% RICA stenosis (has been stable).  15. Bradycardia: Holter (1/15) with average HR 78, 1.4% PVCs, several pauses </= 3 seconds.  16. Aortic stenosis: At least mild on 1/15 echo.   SH: Widow, 3 children, lives in [place], prior smoker quit '96.   FH: Sister with CAD  Current Outpatient Prescriptions  Medication Sig Dispense Refill  . allopurinol (ZYLOPRIM) 300 MG tablet Take 1 tablet (300 mg total) by mouth daily.  90 tablet  3  . atorvastatin (LIPITOR) 40 MG  tablet TAKE 1 TABLET (40 MG TOTAL) BY MOUTH DAILY.  90 tablet  3  . Calcium-Vitamin D-Vitamin K (CALCIUM SOFT CHEWS PO) Take 1,000 mg by mouth daily.        . Cholecalciferol (VITAMIN D3) 2000 UNITS TABS Take 1 tablet by mouth daily.        . furosemide (LASIX) 20 MG tablet Take 1 tablet (20 mg total) by mouth daily.  90 tablet  3  . ibandronate (BONIVA) 150 MG tablet Take 1 tablet (150 mg total) by mouth every 30 (thirty) days. Take in the morning with a full glass of water, on an empty stomach, and do not take anything else by mouth or lie down for the next 30 min.  3 tablet  3  . levothyroxine (SYNTHROID, LEVOTHROID) 50 MCG tablet Take 1 tablet (50 mcg total) by mouth daily.  90 tablet  3  . losartan (COZAAR) 100 MG tablet Take 1 tablet (100 mg total) by mouth daily.  90 tablet  3  . metoprolol succinate (TOPROL XL) 50 MG 24 hr tablet 1 and 1/2 tablets (total 75mg ) daily at the same time. Take with or immediately following a meal.  45 tablet  3  . Pyridoxine HCl (VITAMIN B-6) 500 MG tablet Take 500 mg by mouth daily.        . traMADol (ULTRAM) 50  MG tablet TAKE 1 TO 2 TABLETS BY MOUTH EVERY 6 HOURS AS NEEDED FOR PAIN  180 tablet  1  . warfarin (COUMADIN) 5 MG tablet TAKE AS DIRECTED BY ANTICOAGULATION CLINIC  90 tablet  1   No current facility-administered medications for this visit.    BP 144/78  Pulse 84  Ht 5\' 3"  (1.6 m)  Wt 44.453 kg (98 lb)  BMI 17.36 kg/m2 General: NAD Neck: No JVD, no thyromegaly or thyroid nodule.  Lungs: Clear to auscultation bilaterally with normal respiratory effort. CV: Nondisplaced PMI.  Heart irregular S1/S2, no S3/S4, 1/6 SEM RUSB.  No peripheral edema.  +lower leg varicosities.  No carotid bruit.  Normal pedal pulses.  Abdomen: Soft, nontender, no hepatosplenomegaly, no distention.  Skin: Intact without lesions or rashes.  Neurologic: Alert and oriented x 3.  Psych: Normal affect. Extremities: No clubbing or cyanosis.   Assessment/Plan: 1. Atrial  fibrillation: Chronic x years.  She is on warfarin but has had no problems with it and wants to continue it (rather than switching over to a NOAC).  She seems to be doing well on Toprol XL with no change in symptoms.  She has a history of CVA, so if she has to go off warfarin in the future, will need bridging Lovenox.  2. Chronic diastolic CHF: Patient does not appear volume overloaded on exam.  NYHA class II-III.  EF normal on recent echo.  Continue current Lasix.  3. Carotid stenosis: Stable x several years.  No followup unless she develops symptoms.   4. Hyperlipidemia: Lipids ok in 3/14.  Goal LDL < 70 with known vascular disease (carotid stenosis).  Continue atorvastatin at current dose.  5. HTN: BP is under reasonable control.   6. Bradycardia: Holter showed several </= 3 second pauses.  No lightheadedness.  As long as pauses are relatively short and she is asymptomatic, no changes.  At some point, she may end up needing a pacemaker for tachy-brady syndrome.  [name] 10/18/2013

## 2013-11-03 ENCOUNTER — Ambulatory Visit (INDEPENDENT_AMBULATORY_CARE_PROVIDER_SITE_OTHER): Payer: Medicare Other | Admitting: Family Medicine

## 2013-11-03 ENCOUNTER — Encounter: Payer: Self-pay | Admitting: Family Medicine

## 2013-11-03 ENCOUNTER — Ambulatory Visit (INDEPENDENT_AMBULATORY_CARE_PROVIDER_SITE_OTHER): Payer: Medicare Other | Admitting: Pharmacist

## 2013-11-03 VITALS — BP 110/78 | HR 73 | Temp 97.5°F | Ht 63.0 in | Wt 103.0 lb

## 2013-11-03 DIAGNOSIS — I4891 Unspecified atrial fibrillation: Secondary | ICD-10-CM

## 2013-11-03 DIAGNOSIS — R0600 Dyspnea, unspecified: Secondary | ICD-10-CM

## 2013-11-03 DIAGNOSIS — I5032 Chronic diastolic (congestive) heart failure: Secondary | ICD-10-CM

## 2013-11-03 DIAGNOSIS — R0989 Other specified symptoms and signs involving the circulatory and respiratory systems: Secondary | ICD-10-CM

## 2013-11-03 DIAGNOSIS — I509 Heart failure, unspecified: Secondary | ICD-10-CM

## 2013-11-03 DIAGNOSIS — Z5181 Encounter for therapeutic drug level monitoring: Secondary | ICD-10-CM | POA: Insufficient documentation

## 2013-11-03 DIAGNOSIS — I6529 Occlusion and stenosis of unspecified carotid artery: Secondary | ICD-10-CM

## 2013-11-03 DIAGNOSIS — R0609 Other forms of dyspnea: Secondary | ICD-10-CM

## 2013-11-03 LAB — POCT INR: INR: 2.8

## 2013-11-03 MED ORDER — DILTIAZEM HCL ER COATED BEADS 120 MG PO CP24: 120.0000 mg | ORAL_CAPSULE | Freq: Every day | ORAL | Status: DC

## 2013-11-03 NOTE — Progress Notes (Signed)
Pre visit review using our clinic review tool, if applicable. No additional management support is needed unless otherwise documented below in the visit note. 

## 2013-11-03 NOTE — Progress Notes (Signed)
   Subjective:    Patient ID: [name], female    DOB: 1925/12/15, 80 y.o.   MRN: [id number]  HPI Here with her daughter for some mild SOB and an occasional dry cough this past week. No fever or ST or sinus sx. She recently saw Dr. Aundra Dubin and had an evaluation including a Holter that came back fairly benign, as well as an ECHO with an EF of 55%. He changed her from Atenolol to Metoprolol, and they think this could be the cause of her SOB.    Review of Systems  Constitutional: Negative.   HENT: Negative.   Eyes: Negative.   Respiratory: Positive for cough and shortness of breath. Negative for choking, chest tightness, wheezing and stridor.   Cardiovascular: Negative.        Objective:   Physical Exam  Constitutional: She appears well-developed and well-nourished.  Neck: No thyromegaly present.  Cardiovascular: Normal rate, normal heart sounds and intact distal pulses.  Exam reveals no gallop and no friction rub.   No murmur heard. Irregular rhythm  Pulmonary/Chest: Effort normal and breath sounds normal. No respiratory distress. She has no wheezes. She has no rales.  Lymphadenopathy:    She has no cervical adenopathy.          Assessment & Plan:  She seems stable today. I told them that beta blockers can in fact cause SOB in some patients, so we will stop this and try a CCB instead. She will follow up in 3-4 weeks.

## 2013-11-17 ENCOUNTER — Ambulatory Visit (INDEPENDENT_AMBULATORY_CARE_PROVIDER_SITE_OTHER): Payer: Medicare Other | Admitting: *Deleted

## 2013-11-17 DIAGNOSIS — Z5181 Encounter for therapeutic drug level monitoring: Secondary | ICD-10-CM

## 2013-11-17 DIAGNOSIS — I4891 Unspecified atrial fibrillation: Secondary | ICD-10-CM

## 2013-11-17 LAB — POCT INR: INR: 1.8

## 2013-12-01 ENCOUNTER — Ambulatory Visit (INDEPENDENT_AMBULATORY_CARE_PROVIDER_SITE_OTHER): Payer: Medicare Other | Admitting: Pharmacist

## 2013-12-01 DIAGNOSIS — Z5181 Encounter for therapeutic drug level monitoring: Secondary | ICD-10-CM

## 2013-12-01 DIAGNOSIS — I4891 Unspecified atrial fibrillation: Secondary | ICD-10-CM

## 2013-12-01 LAB — POCT INR: INR: 2.3

## 2013-12-22 ENCOUNTER — Ambulatory Visit (INDEPENDENT_AMBULATORY_CARE_PROVIDER_SITE_OTHER): Payer: Medicare Other | Admitting: *Deleted

## 2013-12-22 DIAGNOSIS — I4891 Unspecified atrial fibrillation: Secondary | ICD-10-CM

## 2013-12-22 DIAGNOSIS — Z5181 Encounter for therapeutic drug level monitoring: Secondary | ICD-10-CM

## 2013-12-22 LAB — POCT INR: INR: 2.4

## 2014-01-02 ENCOUNTER — Telehealth: Payer: Self-pay | Admitting: Family Medicine

## 2014-01-02 NOTE — Telephone Encounter (Signed)
appt scheduled 01/03/14 3:00pm//kar

## 2014-01-02 NOTE — Telephone Encounter (Signed)
Pt is having some more issues with breathing and frequent bowel movements, as soon as she eats she is going to the bathroom.  Can we work patient in tomorrow?  She would really like to see Dr. Sarajane Jews. Please advise.

## 2014-01-02 NOTE — Telephone Encounter (Signed)
Okay to schedule

## 2014-01-03 ENCOUNTER — Encounter: Payer: Self-pay | Admitting: Family Medicine

## 2014-01-03 ENCOUNTER — Telehealth: Payer: Self-pay | Admitting: Family Medicine

## 2014-01-03 ENCOUNTER — Ambulatory Visit (INDEPENDENT_AMBULATORY_CARE_PROVIDER_SITE_OTHER): Payer: Medicare Other | Admitting: Family Medicine

## 2014-01-03 VITALS — BP 98/66 | Temp 97.6°F | Wt 108.0 lb

## 2014-01-03 DIAGNOSIS — M545 Low back pain, unspecified: Secondary | ICD-10-CM

## 2014-01-03 DIAGNOSIS — E039 Hypothyroidism, unspecified: Secondary | ICD-10-CM

## 2014-01-03 DIAGNOSIS — I509 Heart failure, unspecified: Secondary | ICD-10-CM

## 2014-01-03 DIAGNOSIS — M1990 Unspecified osteoarthritis, unspecified site: Secondary | ICD-10-CM

## 2014-01-03 DIAGNOSIS — I4891 Unspecified atrial fibrillation: Secondary | ICD-10-CM

## 2014-01-03 DIAGNOSIS — I5032 Chronic diastolic (congestive) heart failure: Secondary | ICD-10-CM

## 2014-01-03 DIAGNOSIS — I10 Essential (primary) hypertension: Secondary | ICD-10-CM

## 2014-01-03 LAB — HEPATIC FUNCTION PANEL
ALBUMIN: 3.7 g/dL (ref 3.5–5.2)
ALT: 13 U/L (ref 0–35)
AST: 35 U/L (ref 0–37)
Alkaline Phosphatase: 61 U/L (ref 39–117)
Bilirubin, Direct: 0.3 mg/dL (ref 0.0–0.3)
Total Bilirubin: 1.2 mg/dL (ref 0.3–1.2)
Total Protein: 7.9 g/dL (ref 6.0–8.3)

## 2014-01-03 LAB — CBC WITH DIFFERENTIAL/PLATELET
Basophils Absolute: 0 10*3/uL (ref 0.0–0.1)
Basophils Relative: 0.8 % (ref 0.0–3.0)
EOS PCT: 1.4 % (ref 0.0–5.0)
Eosinophils Absolute: 0.1 10*3/uL (ref 0.0–0.7)
HEMATOCRIT: 35.9 % — AB (ref 36.0–46.0)
HEMOGLOBIN: 11.7 g/dL — AB (ref 12.0–15.0)
LYMPHS ABS: 1.8 10*3/uL (ref 0.7–4.0)
LYMPHS PCT: 32.5 % (ref 12.0–46.0)
MCHC: 32.7 g/dL (ref 30.0–36.0)
MCV: 101 fl — ABNORMAL HIGH (ref 78.0–100.0)
Monocytes Absolute: 0.5 10*3/uL (ref 0.1–1.0)
Monocytes Relative: 8.8 % (ref 3.0–12.0)
Neutro Abs: 3.1 10*3/uL (ref 1.4–7.7)
Neutrophils Relative %: 56.5 % (ref 43.0–77.0)
PLATELETS: 176 10*3/uL (ref 150.0–400.0)
RBC: 3.56 Mil/uL — ABNORMAL LOW (ref 3.87–5.11)
RDW: 16.8 % — AB (ref 11.5–14.6)
WBC: 5.4 10*3/uL (ref 4.5–10.5)

## 2014-01-03 LAB — BASIC METABOLIC PANEL
BUN: 15 mg/dL (ref 6–23)
CO2: 26 mEq/L (ref 19–32)
CREATININE: 0.9 mg/dL (ref 0.4–1.2)
Calcium: 9.4 mg/dL (ref 8.4–10.5)
Chloride: 99 mEq/L (ref 96–112)
GFR: 64.81 mL/min (ref >60.00)
Glucose, Bld: 86 mg/dL (ref 70–99)
POTASSIUM: 3.9 meq/L (ref 3.5–5.1)
Sodium: 132 mEq/L — ABNORMAL LOW (ref 135–145)

## 2014-01-03 LAB — TSH: TSH: 5.28 u[IU]/mL (ref 0.35–5.50)

## 2014-01-03 MED ORDER — OXYCODONE-ACETAMINOPHEN 5-325 MG PO TABS: 2.0000 | ORAL_TABLET | Freq: Four times a day (QID) | ORAL | Status: DC | PRN

## 2014-01-03 NOTE — Progress Notes (Signed)
Pre visit review using our clinic review tool, if applicable. No additional management support is needed unless otherwise documented below in the visit note. 

## 2014-01-03 NOTE — Progress Notes (Signed)
   Subjective:    Patient ID: [name], female    DOB: October 27, 1925, 80 y.o.   MRN: [id number]  HPI Here to follow up on multiple issues. She saw Cardiology in January and her CHF and atrial fib are stable. She complains of feeling weak and tired as usual. She is using Ensure shakes and has put a little weight back on. She has had some constipation.    Review of Systems  Constitutional: Positive for fatigue.  Respiratory: Negative.   Cardiovascular: Negative.   Neurological: Negative.        Objective:   Physical Exam  Constitutional: She is oriented to person, place, and time. She appears well-developed and well-nourished.  Cardiovascular: Normal heart sounds and intact distal pulses.  Exam reveals no gallop and no friction rub.   No murmur heard. Rapid rate, irregular rhythm   Pulmonary/Chest: Effort normal and breath sounds normal.  Neurological: She is alert and oriented to person, place, and time.          Assessment & Plan:  Get some labs today. Try Miralax daily.

## 2014-01-03 NOTE — Telephone Encounter (Signed)
Relevant patient education mailed to patient.  

## 2014-01-08 ENCOUNTER — Telehealth: Payer: Self-pay | Admitting: Cardiology

## 2014-01-08 NOTE — Telephone Encounter (Signed)
New Message:  Pt's daughter states the pt is SOB now and has been all weekend. Wants to speak to a nurse

## 2014-01-08 NOTE — Telephone Encounter (Signed)
Pt's daughter called today because pt has been having SOB during the weekend, not able to walk a few steps to the restroom. Pt was seen by the PCP last week for the sob, PCP states that pt could have an allergies and she was not satisfied with that answer. Pt's daughter states. That during the weekend she called the on call person and she was recommended for pt to double up on pt's Lasix, which she did sunday. Pt's daughter states she has seen the improvement on pt's breathing. Daughter said that pt will double up the medication today also. Pt has an appointment with the PA tomorrow 01/09/14 at 8:30 AM, Daughter states it is fine to wait to be seen tomorrow. Daughter is aware that she can call back if needed.

## 2014-01-09 ENCOUNTER — Encounter (HOSPITAL_COMMUNITY): Payer: Self-pay | Admitting: General Practice

## 2014-01-09 ENCOUNTER — Encounter: Payer: Self-pay | Admitting: Physician Assistant

## 2014-01-09 ENCOUNTER — Inpatient Hospital Stay (HOSPITAL_COMMUNITY)
Admission: AD | Admit: 2014-01-09 | Discharge: 2014-01-15 | DRG: 293 | Disposition: A | Discharge status: Home Health | Payer: Medicare Other | Source: Ambulatory Visit | Attending: Interventional Cardiology | Admitting: Interventional Cardiology

## 2014-01-09 ENCOUNTER — Ambulatory Visit (INDEPENDENT_AMBULATORY_CARE_PROVIDER_SITE_OTHER): Payer: Medicare Other | Admitting: Physician Assistant

## 2014-01-09 VITALS — BP 122/93 | HR 149 | Ht 63.0 in | Wt 101.0 lb

## 2014-01-09 DIAGNOSIS — T40605A Adverse effect of unspecified narcotics, initial encounter: Secondary | ICD-10-CM | POA: Diagnosis present

## 2014-01-09 DIAGNOSIS — R29898 Other symptoms and signs involving the musculoskeletal system: Secondary | ICD-10-CM | POA: Diagnosis present

## 2014-01-09 DIAGNOSIS — I509 Heart failure, unspecified: Secondary | ICD-10-CM

## 2014-01-09 DIAGNOSIS — Z7901 Long term (current) use of anticoagulants: Secondary | ICD-10-CM

## 2014-01-09 DIAGNOSIS — E785 Hyperlipidemia, unspecified: Secondary | ICD-10-CM

## 2014-01-09 DIAGNOSIS — R609 Edema, unspecified: Secondary | ICD-10-CM

## 2014-01-09 DIAGNOSIS — I5032 Chronic diastolic (congestive) heart failure: Secondary | ICD-10-CM

## 2014-01-09 DIAGNOSIS — I69998 Other sequelae following unspecified cerebrovascular disease: Secondary | ICD-10-CM

## 2014-01-09 DIAGNOSIS — I959 Hypotension, unspecified: Secondary | ICD-10-CM | POA: Diagnosis present

## 2014-01-09 DIAGNOSIS — I4891 Unspecified atrial fibrillation: Secondary | ICD-10-CM

## 2014-01-09 DIAGNOSIS — I498 Other specified cardiac arrhythmias: Secondary | ICD-10-CM | POA: Diagnosis present

## 2014-01-09 DIAGNOSIS — M1990 Unspecified osteoarthritis, unspecified site: Secondary | ICD-10-CM | POA: Diagnosis present

## 2014-01-09 DIAGNOSIS — I10 Essential (primary) hypertension: Secondary | ICD-10-CM | POA: Diagnosis present

## 2014-01-09 DIAGNOSIS — I4821 Permanent atrial fibrillation: Secondary | ICD-10-CM | POA: Diagnosis present

## 2014-01-09 DIAGNOSIS — R4181 Age-related cognitive decline: Secondary | ICD-10-CM | POA: Diagnosis present

## 2014-01-09 DIAGNOSIS — I6529 Occlusion and stenosis of unspecified carotid artery: Secondary | ICD-10-CM

## 2014-01-09 DIAGNOSIS — M810 Age-related osteoporosis without current pathological fracture: Secondary | ICD-10-CM | POA: Diagnosis present

## 2014-01-09 DIAGNOSIS — E039 Hypothyroidism, unspecified: Secondary | ICD-10-CM | POA: Diagnosis present

## 2014-01-09 DIAGNOSIS — J069 Acute upper respiratory infection, unspecified: Secondary | ICD-10-CM

## 2014-01-09 DIAGNOSIS — R0600 Dyspnea, unspecified: Secondary | ICD-10-CM

## 2014-01-09 DIAGNOSIS — M545 Low back pain, unspecified: Secondary | ICD-10-CM | POA: Diagnosis present

## 2014-01-09 DIAGNOSIS — Z85820 Personal history of malignant melanoma of skin: Secondary | ICD-10-CM

## 2014-01-09 DIAGNOSIS — I5033 Acute on chronic diastolic (congestive) heart failure: Principal | ICD-10-CM | POA: Diagnosis present

## 2014-01-09 DIAGNOSIS — I359 Nonrheumatic aortic valve disorder, unspecified: Secondary | ICD-10-CM | POA: Diagnosis present

## 2014-01-09 DIAGNOSIS — Z85828 Personal history of other malignant neoplasm of skin: Secondary | ICD-10-CM

## 2014-01-09 DIAGNOSIS — G8929 Other chronic pain: Secondary | ICD-10-CM | POA: Diagnosis present

## 2014-01-09 LAB — COMPREHENSIVE METABOLIC PANEL
ALK PHOS: 65 U/L (ref 39–117)
ALT: 10 U/L (ref 0–35)
AST: 29 U/L (ref 0–37)
Albumin: 3.5 g/dL (ref 3.5–5.2)
BUN: 15 mg/dL (ref 6–23)
CO2: 23 mEq/L (ref 19–32)
Calcium: 9.4 mg/dL (ref 8.4–10.5)
Chloride: 98 mEq/L (ref 96–112)
Creatinine, Ser: 0.79 mg/dL (ref 0.50–1.10)
GFR, EST AFRICAN AMERICAN: 86 mL/min — AB (ref >90)
GFR, EST NON AFRICAN AMERICAN: 74 mL/min — AB (ref >90)
GLUCOSE: 85 mg/dL (ref 70–99)
POTASSIUM: 3.9 meq/L (ref 3.7–5.3)
SODIUM: 136 meq/L — AB (ref 137–147)
Total Bilirubin: 0.7 mg/dL (ref 0.3–1.2)
Total Protein: 7.7 g/dL (ref 6.0–8.3)

## 2014-01-09 LAB — PRO B NATRIURETIC PEPTIDE: Pro B Natriuretic peptide (BNP): 8808 pg/mL — ABNORMAL HIGH (ref 0–450)

## 2014-01-09 LAB — CBC WITH DIFFERENTIAL/PLATELET
Basophils Absolute: 0 10*3/uL (ref 0.0–0.1)
Basophils Relative: 1 % (ref 0–1)
Eosinophils Absolute: 0.1 10*3/uL (ref 0.0–0.7)
Eosinophils Relative: 2 % (ref 0–5)
HCT: 35.9 % — ABNORMAL LOW (ref 36.0–46.0)
HEMOGLOBIN: 12.1 g/dL (ref 12.0–15.0)
LYMPHS ABS: 1.7 10*3/uL (ref 0.7–4.0)
Lymphocytes Relative: 30 % (ref 12–46)
MCH: 33 pg (ref 26.0–34.0)
MCHC: 33.7 g/dL (ref 30.0–36.0)
MCV: 97.8 fL (ref 78.0–100.0)
MONOS PCT: 9 % (ref 3–12)
Monocytes Absolute: 0.5 10*3/uL (ref 0.1–1.0)
NEUTROS PCT: 58 % (ref 43–77)
Neutro Abs: 3.2 10*3/uL (ref 1.7–7.7)
PLATELETS: 188 10*3/uL (ref 150–400)
RBC: 3.67 MIL/uL — AB (ref 3.87–5.11)
RDW: 15.2 % (ref 11.5–15.5)
WBC: 5.5 10*3/uL (ref 4.0–10.5)

## 2014-01-09 LAB — TROPONIN I
Troponin I: <0.3 ng/mL (ref <0.30)
Troponin I: <0.3 ng/mL (ref <0.30)

## 2014-01-09 LAB — PROTIME-INR
INR: 3.63 — ABNORMAL HIGH (ref 0.00–1.49)
Prothrombin Time: 34.8 seconds — ABNORMAL HIGH (ref 11.6–15.2)

## 2014-01-09 MED ORDER — TRAMADOL HCL 50 MG PO TABS: 50.0000 mg | ORAL_TABLET | Freq: Four times a day (QID) | ORAL | Status: DC | PRN

## 2014-01-09 MED ORDER — LOSARTAN POTASSIUM 50 MG PO TABS
100.0000 mg | ORAL_TABLET | Freq: Every day | ORAL | Status: DC
  Administered 2014-01-10: 100 mg via ORAL
  Filled 2014-01-09 (×2): qty 2

## 2014-01-09 MED ORDER — SODIUM CHLORIDE 0.9 % IJ SOLN: 3.0000 mL | INTRAMUSCULAR | Status: DC | PRN

## 2014-01-09 MED ORDER — ALLOPURINOL 300 MG PO TABS
300.0000 mg | ORAL_TABLET | Freq: Every day | ORAL | Status: DC
Start: 2014-01-10 — End: 2014-01-15
  Administered 2014-01-10 – 2014-01-15 (×6): 300 mg via ORAL
  Filled 2014-01-09 (×6): qty 1

## 2014-01-09 MED ORDER — WARFARIN SODIUM 5 MG PO TABS: 5.0000 mg | ORAL_TABLET | Freq: Every day | ORAL | Status: DC

## 2014-01-09 MED ORDER — POTASSIUM CHLORIDE CRYS ER 10 MEQ PO TBCR
10.0000 meq | EXTENDED_RELEASE_TABLET | Freq: Two times a day (BID) | ORAL | Status: DC
  Administered 2014-01-09 – 2014-01-11 (×6): 10 meq via ORAL
  Filled 2014-01-09 (×8): qty 1

## 2014-01-09 MED ORDER — OXYCODONE-ACETAMINOPHEN 5-325 MG PO TABS: 2.0000 | ORAL_TABLET | Freq: Four times a day (QID) | ORAL | Status: DC | PRN

## 2014-01-09 MED ORDER — DILTIAZEM HCL 100 MG IV SOLR
5.0000 mg/h | INTRAVENOUS | Status: DC
  Administered 2014-01-09: 5 mg/h via INTRAVENOUS
  Filled 2014-01-09 (×2): qty 100

## 2014-01-09 MED ORDER — WARFARIN - PHARMACIST DOSING INPATIENT
Freq: Every day | Status: DC
  Administered 2014-01-10 – 2014-01-13 (×2)

## 2014-01-09 MED ORDER — SODIUM CHLORIDE 0.9 % IV SOLN: 250.0000 mL | INTRAVENOUS | Status: DC | PRN

## 2014-01-09 MED ORDER — FUROSEMIDE 10 MG/ML IJ SOLN
40.0000 mg | Freq: Every day | INTRAMUSCULAR | Status: DC
  Administered 2014-01-09 – 2014-01-10 (×2): 40 mg via INTRAVENOUS
  Filled 2014-01-09 (×3): qty 4

## 2014-01-09 MED ORDER — LEVOTHYROXINE SODIUM 50 MCG PO TABS
50.0000 ug | ORAL_TABLET | Freq: Every day | ORAL | Status: DC
  Administered 2014-01-10 – 2014-01-15 (×6): 50 ug via ORAL
  Filled 2014-01-09 (×8): qty 1

## 2014-01-09 MED ORDER — SODIUM CHLORIDE 0.9 % IJ SOLN
3.0000 mL | Freq: Two times a day (BID) | INTRAMUSCULAR | Status: DC
  Administered 2014-01-10: 3 mL via INTRAVENOUS
  Administered 2014-01-11: 10 mL via INTRAVENOUS
  Administered 2014-01-12 – 2014-01-14 (×5): 3 mL via INTRAVENOUS

## 2014-01-09 NOTE — Progress Notes (Signed)
ANTICOAGULATION CONSULT NOTE - Initial Consult  Pharmacy Consult for Coumadin Indication: atrial fibrillation  Allergies  Allergen Reactions  . Amlodipine     Has significant edema  . Amoxicillin   . Hydrocodone-Acetaminophen   . Nitrofurantoin   . Sulfonamide Derivatives     Patient Measurements: Height: 5\' 3"  (160 cm) Weight: 108 lb 4.8 oz (49.125 kg) IBW/kg (Calculated) : 52.4 Heparin Dosing Weight:   Vital Signs: Temp: 97.4 F (36.3 C) (04/07 1100) Temp src: Oral (04/07 1100) BP: 120/58 mmHg (04/07 1154) Pulse Rate: 140 (04/07 1100)  Labs:  Recent Labs  01/09/14 1125  HGB 12.1  HCT 35.9*  PLT 188  LABPROT 34.8*  INR 3.63*  CREATININE 0.79  TROPONINI <0.30    Estimated Creatinine Clearance: 39.9 ml/min (by C-G formula based on Cr of 0.79).   Medical History: Past Medical History  Diagnosis Date  . Atrial fibrillation     permanent  . Hypertension     hx of edema with Amlodipine  . Hyperlipidemia   . Edema leg   . Contact dermatitis and other eczema, due to unspecified cause   . Frequent UTI     Dr. Terance Hart sees pt  . Urticaria, unspecified   . Lumbago     chronic low back pain  . Gout, unspecified   . Hypothyroidism   . H/O: CVA (cardiovascular accident) 2008  . DJD (degenerative joint disease)   . Diverticulosis of colon (without mention of hemorrhage)   . Osteoporosis     las DEXA on 01/30/10  . History of melanoma     on right ear  . Skin cancer     forehead  . Blood clotting disorder   . Chronic diastolic heart failure     a. Echo (6/08):  EF 55-60%, mild MR;  b. Echo (10/2013):  EF 55%, mild AS  . History of CVA (cerebrovascular accident)     2008  . Carotid stenosis     a. Carotid US (4/14):  RICA 40-59%  . Bradycardia     a. Holter (1/15):  avg HR 78, 1.4% PVCs, several pauses </= 3 sec,   . Aortic stenosis     mild on echo 10/2013    Medications:  See electronic med rec  Assessment: 87yof on Coumadin for Afib. Admit INR  (3.63) is supratherapeutic - will hold Coumadin tonight and follow-up daily INR. - H/H and Plts wnl - No significant bleeding reported  Goal of Therapy:  INR 2-3 Monitor platelets by anticoagulation protocol: Yes   Plan:  1. No Coumadin tonight 2. Daily INR  Earleen Newport [phone] 01/09/2014,1:47 PM

## 2014-01-09 NOTE — Progress Notes (Signed)
[place], [address], [place]  [address] Phone: [phone] Fax:  [phone]  Date:  01/09/2014   ID:  [name], DOB 27-Jul-1926, MRN [id number]  PCP:  Laurey Morale, MD  Cardiologist:  Dr. [name]     History of Present Illness: [name] is a 80 y.o. female with a hx of chronic atrial fibrillation and chronic diastolic CHF.  Her right leg has been weak since her 2008 CVA (mildly) so she walks with a cane. She has had pauses noted on her ECG and Dr. Aundra Dubin had her stop digoxin and atenolol and start on a lower dose of Toprol XL. A holter after the medication changes showed average HR 79 with several pauses that were </= 3 seconds. She denies lightheadedness or syncope.  Last seen by Dr. [name] in 10/2013.  Volume stable at that time.  Patient called in recently with increased dyspnea.  She took extra Lasix with some improvement in symptoms.  She tells me she has been more short of breath for the last several weeks. She had her metoprolol changed to diltiazem as it was thought that the beta blocker may be contributing to dyspnea. She does continue to have dyspnea with minimal exertion. She is NYHA class III. She denies orthopnea. She may have noted some PND recently. She has had some increased LE edema. Her weight was up about 5 pounds. She denies chest discomfort. She denies syncope. She has noted some rapid palpitations at times  Recent Labs: 01/03/2014: ALT 13; Creatinine 0.9; Hemoglobin 11.7*; Potassium 3.9; TSH 5.28   Wt Readings from Last 3 Encounters:  01/09/14 101 lb (45.813 kg)  01/03/14 108 lb (48.988 kg)  11/03/13 103 lb (46.72 kg)     Past Medical History  Diagnosis Date  . Atrial fibrillation     permanent  . Hypertension     hx of edema with Amlodipine  . Hyperlipidemia   . Edema leg   . Contact dermatitis and other eczema, due to unspecified cause   . Frequent UTI     Dr. Terance Hart sees pt  . Urticaria, unspecified   . Lumbago       chronic low back pain  . Gout, unspecified   . Hypothyroidism   . H/O: CVA (cardiovascular accident) 2008  . DJD (degenerative joint disease)   . Diverticulosis of colon (without mention of hemorrhage)   . Osteoporosis     las DEXA on 01/30/10  . History of melanoma     on right ear  . Skin cancer     forehead  . Blood clotting disorder   . Chronic diastolic heart failure     a. Echo (6/08):  EF 55-60%, mild MR;  b. Echo (10/2013):  EF 55%, mild AS  . History of CVA (cerebrovascular accident)     2008  . Carotid stenosis     a. Carotid US (4/14):  RICA 40-59%  . Bradycardia     a. Holter (1/15):  avg HR 78, 1.4% PVCs, several pauses </= 3 sec,   . Aortic stenosis     mild on echo 10/2013    Current Outpatient Prescriptions  Medication Sig Dispense Refill  . allopurinol (ZYLOPRIM) 300 MG tablet Take 1 tablet (300 mg total) by mouth daily.  90 tablet  3  . Calcium-Vitamin D-Vitamin K (CALCIUM SOFT CHEWS PO) Take 1,000 mg by mouth daily.        . Cholecalciferol (VITAMIN D3) 2000  UNITS TABS Take 1 tablet by mouth daily.        [name] diltiazem (CARDIZEM CD) 120 MG 24 hr capsule Take 1 capsule (120 mg total) by mouth daily.  30 capsule  2  . furosemide (LASIX) 20 MG tablet Take 1 tablet (20 mg total) by mouth daily.  90 tablet  3  . ibandronate (BONIVA) 150 MG tablet Take 1 tablet (150 mg total) by mouth every 30 (thirty) days. Take in the morning with a full glass of water, on an empty stomach, and do not take anything else by mouth or lie down for the next 30 min.  3 tablet  3  . levothyroxine (SYNTHROID, LEVOTHROID) 50 MCG tablet Take 1 tablet (50 mcg total) by mouth daily.  90 tablet  3  . losartan (COZAAR) 100 MG tablet Take 1 tablet (100 mg total) by mouth daily.  90 tablet  3  . oxyCODONE-acetaminophen (ROXICET) 5-325 MG per tablet Take 2 tablets by mouth every 6 (six) hours as needed for moderate pain.  240 tablet  0  . Pyridoxine HCl (VITAMIN B-6) 500 MG tablet Take 500 mg by  mouth daily.        . traMADol (ULTRAM) 50 MG tablet TAKE 1 TO 2 TABLETS BY MOUTH EVERY 6 HOURS AS NEEDED FOR PAIN  180 tablet  1  . warfarin (COUMADIN) 5 MG tablet 5 mg. Except for Mon & Wed. Take 1/2/ tablet       No current facility-administered medications for this visit.    Allergies:   Amlodipine; Amoxicillin; Hydrocodone-acetaminophen; Nitrofurantoin; and Sulfonamide derivatives   Social History:  The patient  reports that she quit smoking about 19 years ago. She has never used smokeless tobacco. She reports that she does not drink alcohol or use illicit drugs.   Family History:  The patient's family history includes Anemia in her sister and another family member; COPD in an other family member; Colon polyps in her mother; Coronary artery disease in an other family member; Immunodeficiency in an other family member. There is no history of Colon cancer.   ROS:  Please see the history of present illness.   She has constipation alternating with diarrhea. She does have some hemorrhoids with scant bright red blood per rectum at times. She has a minor cough   All other systems reviewed and negative.   PHYSICAL EXAM: VS:  BP 122/93  Pulse 149  Ht 5\' 3"  (1.6 m)  Wt 101 lb (45.813 kg)  BMI 17.90 kg/m2 Well nourished, well developed, in no acute distress HEENT: normal Neck: + JVD Cardiac:  normal S1, S2; irregularly irregular rhythm; I cannot appreciate a murmur Lungs:  Decreased breath sounds bilaterally with bibasilar crackles Abd: soft, nontender, no hepatomegaly Ext: 1+ bilateral LE edema Skin: warm and dry Neuro:  CNs 2-12 intact, no focal abnormalities noted  EKG:  Atrial fibrillation, HR 149     ASSESSMENT AND PLAN:  1. Acute on Chronic Diastolic CHF:  This is in the setting of atrial fibrillation with RVR. I suspect the volume overload is driving her elevated heart rate. With her advanced age and frailty, I am not certain she would tolerate this for much longer. I have  recommended admission to the hospital. I reviewed this with Dr. Tamala Julian (DOD) who also saw the patient. He agreed. She will be admitted for IV diuresis and rate control. 2. Atrial Fibrillation with RVR: As noted, she will be admitted for IV diuresis and rate control. She  will be placed on IV diltiazem.  She remains on Coumadin. 3. Hypertension:  Fair control. This will be managed in the context of controlling her heart rate. 4. Carotid Stenosis:  This is being managed conservatively. She stopped Lipitor due to leg pain.  She is not on ASA as she is on coumadin.  5. Hyperlipidemia:  Intol to Lipitor as noted above.  Would consider Pravastatin 40 QHS.  This can be started at d/c.  6. Disposition:  Admit to [facility] today.    Signed, [name], PA-C  01/09/2014 9:31 AM

## 2014-01-09 NOTE — Progress Notes (Signed)
Utilization review completed.  

## 2014-01-09 NOTE — Progress Notes (Addendum)
Patient's HR is sustaining in 110-120's on 5 mcg of Cardizem; pt asymptomatic.  BP 90/68, Kerin Ransom, PA notified and orders received to notify MD if HR starts to sustain in 130's.  No up-titration at this time.  Will continue to monitor. [place], Ardeth Sportsman

## 2014-01-09 NOTE — H&P (Signed)
Admission History and Physical   Date:  01/09/2014   ID:  [name], DOB 02/05/1926, MRN [id number]  PCP:  Laurey Morale, MD  Cardiologist:  Dr. [name]     History of Present Illness: [name] is a 80 y.o. female with a hx of chronic atrial fibrillation and chronic diastolic CHF.  Her right leg has been weak since her 2008 CVA (mildly) so she walks with a cane. She has had pauses noted on her ECG and Dr. Aundra Dubin had her stop digoxin and atenolol and start on a lower dose of Toprol XL. A holter after the medication changes showed average HR 79 with several pauses that were </= 3 seconds. She denies lightheadedness or syncope.  Last seen by Dr. [name] in 10/2013.  Volume stable at that time.  Patient called in recently with increased dyspnea.  She took extra Lasix with some improvement in symptoms.  She tells me she has been more short of breath for the last several weeks. She had her metoprolol changed to diltiazem as it was thought that the beta blocker may be contributing to dyspnea. She does continue to have dyspnea with minimal exertion. She is NYHA class III. She denies orthopnea. She may have noted some PND recently. She has had some increased LE edema. Her weight was up about 5 pounds. She denies chest discomfort. She denies syncope. She has noted some rapid palpitations at times  Recent Labs: 01/03/2014: ALT 13; Creatinine 0.9; Hemoglobin 11.7*; Potassium 3.9; TSH 5.28   Wt Readings from Last 3 Encounters:  01/09/14 101 lb (45.813 kg)  01/03/14 108 lb (48.988 kg)  11/03/13 103 lb (46.72 kg)     Past Medical History  Diagnosis Date  . Atrial fibrillation     permanent  . Hypertension     hx of edema with Amlodipine  . Hyperlipidemia   . Edema leg   . Contact dermatitis and other eczema, due to unspecified cause   . Frequent UTI     Dr. Terance Hart sees pt  . Urticaria, unspecified   . Lumbago     chronic low back pain  . Gout, unspecified   .  Hypothyroidism   . H/O: CVA (cardiovascular accident) 2008  . DJD (degenerative joint disease)   . Diverticulosis of colon (without mention of hemorrhage)   . Osteoporosis     las DEXA on 01/30/10  . History of melanoma     on right ear  . Skin cancer     forehead  . Blood clotting disorder   . Chronic diastolic heart failure     a. Echo (6/08):  EF 55-60%, mild MR;  b. Echo (10/2013):  EF 55%, mild AS  . History of CVA (cerebrovascular accident)     2008  . Carotid stenosis     a. Carotid US (4/14):  RICA 40-59%  . Bradycardia     a. Holter (1/15):  avg HR 78, 1.4% PVCs, several pauses </= 3 sec,   . Aortic stenosis     mild on echo 10/2013    Current Outpatient Prescriptions  Medication Sig Dispense Refill  . allopurinol (ZYLOPRIM) 300 MG tablet Take 1 tablet (300 mg total) by mouth daily.  90 tablet  3  . Calcium-Vitamin D-Vitamin K (CALCIUM SOFT CHEWS PO) Take 1,000 mg by mouth daily.        . Cholecalciferol (VITAMIN D3) 2000 UNITS TABS Take 1 tablet by mouth daily.        [name]  diltiazem (CARDIZEM CD) 120 MG 24 hr capsule Take 1 capsule (120 mg total) by mouth daily.  30 capsule  2  . furosemide (LASIX) 20 MG tablet Take 1 tablet (20 mg total) by mouth daily.  90 tablet  3  . ibandronate (BONIVA) 150 MG tablet Take 1 tablet (150 mg total) by mouth every 30 (thirty) days. Take in the morning with a full glass of water, on an empty stomach, and do not take anything else by mouth or lie down for the next 30 min.  3 tablet  3  . levothyroxine (SYNTHROID, LEVOTHROID) 50 MCG tablet Take 1 tablet (50 mcg total) by mouth daily.  90 tablet  3  . losartan (COZAAR) 100 MG tablet Take 1 tablet (100 mg total) by mouth daily.  90 tablet  3  . oxyCODONE-acetaminophen (ROXICET) 5-325 MG per tablet Take 2 tablets by mouth every 6 (six) hours as needed for moderate pain.  240 tablet  0  . Pyridoxine HCl (VITAMIN B-6) 500 MG tablet Take 500 mg by mouth daily.        . traMADol (ULTRAM) 50 MG tablet  TAKE 1 TO 2 TABLETS BY MOUTH EVERY 6 HOURS AS NEEDED FOR PAIN  180 tablet  1  . warfarin (COUMADIN) 5 MG tablet 5 mg. Except for Mon & Wed. Take 1/2/ tablet       No current facility-administered medications for this visit.    Allergies:   Amlodipine; Amoxicillin; Hydrocodone-acetaminophen; Nitrofurantoin; and Sulfonamide derivatives   Social History:  The patient  reports that she quit smoking about 19 years ago. She has never used smokeless tobacco. She reports that she does not drink alcohol or use illicit drugs.   Family History:  The patient's family history includes Anemia in her sister and another family member; COPD in an other family member; Colon polyps in her mother; Coronary artery disease in an other family member; Immunodeficiency in an other family member. There is no history of Colon cancer.   ROS:  Please see the history of present illness.   She has constipation alternating with diarrhea. She does have some hemorrhoids with scant bright red blood per rectum at times. She has a minor cough   All other systems reviewed and negative.   PHYSICAL EXAM: VS:  BP 122/93  Pulse 149  Ht 5\' 3"  (1.6 m)  Wt 101 lb (45.813 kg)  BMI 17.90 kg/m2 Well nourished, well developed, in no acute distress HEENT: normal Neck: + JVD Cardiac:  normal S1, S2; irregularly irregular rhythm; I cannot appreciate a murmur Lungs:  Decreased breath sounds bilaterally with bibasilar crackles Abd: soft, nontender, no hepatomegaly Ext: 1+ bilateral LE edema Skin: warm and dry Neuro:  CNs 2-12 intact, no focal abnormalities noted  EKG:  Atrial fibrillation, HR 149     ASSESSMENT AND PLAN:  1. Acute on Chronic Diastolic CHF:  This is in the setting of atrial fibrillation with RVR. I suspect the volume overload is driving her elevated heart rate. With her advanced age and frailty, I am not certain she would tolerate this for much longer. I have recommended admission to the hospital. I reviewed this with  Dr. Tamala Julian (D[facility]) who also saw the patient. He agreed. She will be admitted for IV diuresis and rate control. 2. Atrial Fibrillation with RVR: As noted, she will be admitted for IV diuresis and rate control. She will be placed on IV diltiazem.  She remains on Coumadin. 3. Hypertension:  Fair control.  This will be managed in the context of controlling her heart rate. 4. Carotid Stenosis:  This is being managed conservatively. She stopped Lipitor due to leg pain.  She is not on ASA as she is on coumadin.  5. Hyperlipidemia:  Intol to Lipitor as noted above.  Would consider Pravastatin 40 QHS.  This can be started at d/c.  6. Disposition:  Admit to [facility] today.    Signed, [name], PA-C  01/09/2014 9:31 AM

## 2014-01-09 NOTE — Patient Instructions (Signed)
PT ADMITTED TO [facility] TO 3 WEST

## 2014-01-09 NOTE — H&P (Signed)
The patient was seen and examined along with Kathleen Argue, PA-C. She has significant and progressive A/C DHF symptoms since developing PAF. Additionally, the HR has been poorly controlled since adjustments were made due to bradycardia.. Agree with plan to admit and more rapidly control volume and rate in the elderly and relatively frail lady.

## 2014-01-10 ENCOUNTER — Inpatient Hospital Stay (HOSPITAL_COMMUNITY): Payer: Medicare Other

## 2014-01-10 LAB — BASIC METABOLIC PANEL
BUN: 17 mg/dL (ref 6–23)
CHLORIDE: 100 meq/L (ref 96–112)
CO2: 23 meq/L (ref 19–32)
Calcium: 9 mg/dL (ref 8.4–10.5)
Creatinine, Ser: 0.93 mg/dL (ref 0.50–1.10)
GFR calc Af Amer: 63 mL/min — ABNORMAL LOW (ref >90)
GFR calc non Af Amer: 54 mL/min — ABNORMAL LOW (ref >90)
Glucose, Bld: 85 mg/dL (ref 70–99)
Potassium: 4 mEq/L (ref 3.7–5.3)
SODIUM: 137 meq/L (ref 137–147)

## 2014-01-10 LAB — PROTIME-INR
INR: 3.31 — AB (ref 0.00–1.49)
Prothrombin Time: 32.4 seconds — ABNORMAL HIGH (ref 11.6–15.2)

## 2014-01-10 LAB — TROPONIN I: Troponin I: <0.3 ng/mL (ref <0.30)

## 2014-01-10 MED ORDER — WARFARIN SODIUM 1 MG PO TABS
1.0000 mg | ORAL_TABLET | Freq: Once | ORAL | Status: AC
  Administered 2014-01-10: 1 mg via ORAL
  Filled 2014-01-10: qty 1

## 2014-01-10 MED ORDER — OXYCODONE-ACETAMINOPHEN 5-325 MG PO TABS
ORAL_TABLET | ORAL | Status: AC
  Administered 2014-01-10: 1
  Filled 2014-01-10: qty 1

## 2014-01-10 MED ORDER — DILTIAZEM HCL ER COATED BEADS 240 MG PO CP24
240.0000 mg | ORAL_CAPSULE | Freq: Every day | ORAL | Status: DC
  Administered 2014-01-10: 240 mg via ORAL
  Filled 2014-01-10 (×2): qty 1

## 2014-01-10 MED ORDER — POLYETHYLENE GLYCOL 3350 17 G PO PACK
17.0000 g | PACK | Freq: Every day | ORAL | Status: DC
  Administered 2014-01-10 – 2014-01-14 (×4): 17 g via ORAL
  Filled 2014-01-10 (×6): qty 1

## 2014-01-10 MED ORDER — OXYCODONE-ACETAMINOPHEN 5-325 MG PO TABS
1.0000 | ORAL_TABLET | Freq: Four times a day (QID) | ORAL | Status: DC | PRN
  Administered 2014-01-10 – 2014-01-11 (×2): 1 via ORAL
  Filled 2014-01-10 (×2): qty 1

## 2014-01-10 NOTE — Progress Notes (Signed)
Patient Name: [name] Date of Encounter: 01/10/2014   Principal Problem:   Acute on chronic diastolic heart failure Active Problems:   HYPERTENSION   Atrial fibrillation   HYPOTHYROIDISM   HYPERLIPIDEMIA   OSTEOARTHRITIS   SUBJECTIVE  -1140 yesterday.  Wt pending.  Breathing somewhat improved.  No chest pain.  HR's remain in 1-teens.  CURRENT MEDS . allopurinol  300 mg Oral Daily  . furosemide  40 mg Intravenous Daily  . levothyroxine  50 mcg Oral QAC breakfast  . losartan  100 mg Oral Daily  . potassium chloride  10 mEq Oral BID  . sodium chloride  3 mL Intravenous Q12H  . Warfarin - Pharmacist Dosing Inpatient   Does not apply q1800        Diltiazem 5mg /hr gtt.  OBJECTIVE  Filed Vitals:   01/09/14 1405 01/09/14 2018 01/10/14 0015 01/10/14 0428  BP: 90/68 110/73 99/69 92/66   Pulse: 119 96 124 94  Temp: 97.7 F (36.5 C) 98.3 F (36.8 C) 97.8 F (36.6 C) 97.6 F (36.4 C)  TempSrc: Oral Oral Oral Oral  Resp: 16 18 18 18   Height:      Weight:      SpO2: 90% 100% 100% 98%    Intake/Output Summary (Last 24 hours) at 01/10/14 0824 Last data filed at 01/09/14 2227  Gross per 24 hour  Intake     60 ml  Output   1200 ml  Net  -1140 ml   Filed Weights   01/09/14 1100  Weight: 108 lb 4.8 oz (49.125 kg)    PHYSICAL EXAM  General: Pleasant, NAD. Neuro: Alert and oriented X 3. Moves all extremities spontaneously. Psych: Normal affect. HEENT:  Normal  Neck: Supple without bruits.  Mod elevated JVP. Lungs:  Resp regular and unlabored, diminished breath sounds and crackles @ bilat bases. Heart: IR, IR, tachy, distant. Abdomen: Firm, flat, non-tender, BS + x 4.  Extremities: No clubbing, cyanosis or edema. DP/PT/Radials 1+ and equal bilaterally.  Accessory Clinical Findings  CBC  Recent Labs  01/09/14 1125  WBC 5.5  NEUTROABS 3.2  HGB 12.1  HCT 35.9*  MCV 97.8  PLT 967   Basic Metabolic Panel  Recent Labs  01/09/14 1125 01/10/14 0126    NA 136* 137  K 3.9 4.0  CL 98 100  CO2 23 23  GLUCOSE 85 85  BUN 15 17  CREATININE 0.79 0.93  CALCIUM 9.4 9.0   Liver Function Tests  Recent Labs  01/09/14 1125  AST 29  ALT 10  ALKPHOS 65  BILITOT 0.7  PROT 7.7  ALBUMIN 3.5   Cardiac Enzymes  Recent Labs  01/09/14 1125 01/09/14 1850 01/10/14 0126  TROPONINI <0.30 <0.30 <0.30   Lab Results  Component Value Date   INR 3.31* 01/10/2014   INR 3.63* 01/09/2014   INR 2.4 12/22/2013   PROTIME 18.4 03/19/2009    TELE  afib 1-teens.  Radiology/Studies  Dg Chest 2 View  01/10/2014   CLINICAL DATA:  Shortness of breath, CHF, history atrial fibrillation, hypertension, stroke, melanoma  EXAM: CHEST  2 VIEW  COMPARISON:  06/10/2007  FINDINGS: Enlargement of cardiac silhouette.  Atherosclerotic calcification aorta.  Slight pulmonary vascular congestion.  Mediastinal contours otherwise normal.  Emphysematous and bronchitic changes consistent with COPD.  Bibasilar atelectasis.  No definite infiltrate, pleural effusion or pneumothorax.  Bones diffusely demineralized with thoracolumbar scoliosis.  IMPRESSION: COPD changes with bibasilar atelectasis.  Enlargement of cardiac silhouette with pulmonary vascular congestion.  Electronically Signed   By: Lavonia Dana M.D.   On: 01/10/2014 08:08    ASSESSMENT AND PLAN  1. Acute on chronic diastolic chf: -1.1 L since admission.  Wt pending this AM.  She is feeling somewhat better but still has evidence of volume overload on exam.  She reports being up 5 lbs from her previous dry weight but thought that she was just eating better and putting on wt as a result of better nutrition.  She admits to getting prepared meals from the supermarket, which may be the culprit.  Cont IV diuresis today.  Renal fxn stable.  HR remains elevated, likely 2/2 decompensated chf.  BP stable.  Cont ARB and IV dilt today.  Hopefully rate will improve with further compensation of chf and we can switch her to her home dose  of po dilt tomorrow.  Echo in January showed nl LV with some degree of AS/AI.  2.  Permanent Afib:  Rate up in setting of #1.  Cont Dilt as above.  Prev on digoxin but this was stopped previously 2/2 bradycardia and pauses.  INR supraRx.  3.  HTN:  Stable.  Signed, Rogelia Mire NP  Patient seen, examined. Available data reviewed. Agree with findings, assessment, and plan as outlined by Ignacia Bayley, NP. Exam reveals a pleasant, thin elderly woman in no distress. Jugular venous pressure is modestly elevated. Lungs show bibasilar crackles. Heart is irregularly irregular and tachycardic. There is no pretibial edema. Telemetry was reviewed and reveals atrial fibrillation with a heart rate ranging from 90-115. The patient has permanent atrial fibrillation. She has had symptomatic bradycardia in the past. I reviewed extensive office notes regarding her previous management. She had to be taken off of digoxin because of marked bradycardia. I am going to try to increase her oral calcium channel blocker to diltiazem 240 mg daily. Will stop her IV diltiazem after the oral doses given. Agree with continued IV diuresis for treatment of heart failure. We had a lengthy discussion about sodium restriction and dietary modification.  Sherren Mocha, M.D. 01/10/2014 10:57 AM

## 2014-01-10 NOTE — Progress Notes (Signed)
ANTICOAGULATION CONSULT NOTE - Follow Up Consult  Pharmacy Consult for Coumadin Indication: atrial fibrillation  Allergies  Allergen Reactions  . Amlodipine     Has significant edema  . Amoxicillin   . Hydrocodone-Acetaminophen   . Nitrofurantoin   . Sulfonamide Derivatives     Patient Measurements: Height: 5\' 3"  (160 cm) Weight: 108 lb 4.8 oz (49.125 kg) IBW/kg (Calculated) : 52.4 Heparin Dosing Weight:   Vital Signs: Temp: 97.5 F (36.4 C) (04/08 0800) Temp src: Oral (04/08 0800) BP: 92/66 mmHg (04/08 0428) Pulse Rate: 114 (04/08 0800)  Labs:  Recent Labs  01/09/14 1125 01/09/14 1850 01/10/14 0126  HGB 12.1  --   --   HCT 35.9*  --   --   PLT 188  --   --   LABPROT 34.8*  --  32.4*  INR 3.63*  --  3.31*  CREATININE 0.79  --  0.93  TROPONINI <0.30 <0.30 <0.30    Estimated Creatinine Clearance: 34.3 ml/min (by C-G formula based on Cr of 0.93).  Assessment: 87yof continuing on Coumadin for Afib. INR (3.31) remains supratherapeutic but trended down with held dose last PM. Expect INR to continue to decrease - will give low dose Coumadin to hopefully prevent from falling too far.  - No CBC this AM - No significant bleeding reported - PTA regimen: 5mg  daily except 2.5mg  on Mondays and Wednesdays  Goal of Therapy:  INR 2-3   Plan:  1. Coumadin 1mg  PO x 1 today 2. Follow-up AM INR  Patsey Berthold [facility] [phone] 01/10/2014,10:50 AM

## 2014-01-10 NOTE — Plan of Care (Signed)
Problem: Food- and Nutrition-Related Knowledge Deficit (NB-1.1) Goal: Nutrition education Formal process to instruct or train a patient/client in a skill or to impart knowledge to help patients/clients voluntarily manage or modify food choices and eating behavior to maintain or improve health. Outcome: Completed/Met Date Met:  01/10/14 Nutrition Education Note  RD consulted for nutrition education regarding new onset CHF.  RD provided "Low Sodium Nutrition Therapy" handout from the Academy of [employer]. Reviewed patient's dietary recall. Provided examples on ways to decrease sodium intake in diet. Discouraged intake of processed foods and use of salt shaker. Encouraged fresh fruits and vegetables as well as whole grain sources of carbohydrates to maximize fiber intake.   RD discussed why it is important for patient to adhere to diet recommendations, and emphasized the role of fluids, foods to avoid, and importance of weighing self daily. Teach back method used.  Expect good compliance.  Body mass index is 19.19 kg/(m^2). Pt meets criteria for normal weight based on current BMI.  Current diet order is heart healthy, patient is consuming approximately 100% of meals at this time. Labs and medications reviewed. No further nutrition interventions warranted at this time. RD contact information provided. If additional nutrition issues arise, please re-consult RD.    Molli Barrows, RD, LDN, [place] Pager [phone] After Hours Pager [phone]

## 2014-01-11 LAB — BASIC METABOLIC PANEL
BUN: 15 mg/dL (ref 6–23)
CHLORIDE: 100 meq/L (ref 96–112)
CO2: 25 mEq/L (ref 19–32)
Calcium: 9 mg/dL (ref 8.4–10.5)
Creatinine, Ser: 0.96 mg/dL (ref 0.50–1.10)
GFR calc non Af Amer: 52 mL/min — ABNORMAL LOW (ref >90)
GFR, EST AFRICAN AMERICAN: 61 mL/min — AB (ref >90)
Glucose, Bld: 79 mg/dL (ref 70–99)
POTASSIUM: 4.8 meq/L (ref 3.7–5.3)
SODIUM: 137 meq/L (ref 137–147)

## 2014-01-11 LAB — PROTIME-INR
INR: 2.31 — AB (ref 0.00–1.49)
PROTHROMBIN TIME: 24.6 s — AB (ref 11.6–15.2)

## 2014-01-11 MED ORDER — DILTIAZEM HCL 100 MG IV SOLR
5.0000 mg/h | INTRAVENOUS | Status: DC
  Administered 2014-01-11 – 2014-01-12 (×3): 5 mg/h via INTRAVENOUS
  Filled 2014-01-11 (×3): qty 100

## 2014-01-11 MED ORDER — FUROSEMIDE 10 MG/ML IJ SOLN
40.0000 mg | Freq: Three times a day (TID) | INTRAMUSCULAR | Status: DC
Start: 2014-01-11 — End: 2014-01-15
  Administered 2014-01-11 – 2014-01-15 (×11): 40 mg via INTRAVENOUS
  Filled 2014-01-11 (×15): qty 4

## 2014-01-11 MED ORDER — WARFARIN SODIUM 5 MG PO TABS
5.0000 mg | ORAL_TABLET | Freq: Once | ORAL | Status: AC
  Administered 2014-01-11: 5 mg via ORAL
  Filled 2014-01-11: qty 1

## 2014-01-11 NOTE — Progress Notes (Signed)
ANTICOAGULATION CONSULT NOTE - Follow Up Consult  Pharmacy Consult for coumadin Indication: atrial fibrillation  Allergies  Allergen Reactions  . Amlodipine     Has significant edema  . Amoxicillin   . Hydrocodone-Acetaminophen   . Nitrofurantoin   . Sulfonamide Derivatives     Patient Measurements: Height: 5\' 3"  (160 cm) Weight: 110 lb (49.896 kg) IBW/kg (Calculated) : 52.4  Vital Signs: Temp: 97.4 F (36.3 C) (04/09 0503) Temp src: Oral (04/09 0503) BP: 122/65 mmHg (04/09 1048) Pulse Rate: 125 (04/09 0503)  Labs:  Recent Labs  01/09/14 1125 01/09/14 1850 01/10/14 0126 01/11/14 0720  HGB 12.1  --   --   --   HCT 35.9*  --   --   --   PLT 188  --   --   --   LABPROT 34.8*  --  32.4* 24.6*  INR 3.63*  --  3.31* 2.31*  CREATININE 0.79  --  0.93 0.96  TROPONINI <0.30 <0.30 <0.30  --     Estimated Creatinine Clearance: 33.8 ml/min (by C-G formula based on Cr of 0.96).  Assessment: Patient is an 80 y.o F on coumadin for Afib.  INR decreased from 3.31 to 2.31 today with dose held on 4/7 and resumed yesterday at 1mg .  No bleeding documented.   Goal of Therapy:  INR 2-3    Plan:  1) coumadin 5mg  PO x1 today  Mataeo Ingwersen P Safal Halderman 01/11/2014,1:36 PM

## 2014-01-11 NOTE — Progress Notes (Signed)
Patient: [name] / Admit Date: 01/09/2014 / Date of Encounter: 01/11/2014, 8:11 AM  Subjective  Generally weak. Feels like she can't take a shallow breath. Still SOB with activity. She does feel slightly improved but not back to feeling good.  Objective   Telemetry: atrial fib - rates were down to 48 with 2 sec pause overnight but then 140's -> more recently after dilt drip restarted, is at 110s  Physical Exam: Blood pressure 96/68, pulse 125, temperature 97.4 F (36.3 C), temperature source Oral, resp. rate 18, height 5\' 3"  (1.6 m), weight 108 lb 4.8 oz (49.125 kg), SpO2 100.00%. General: Well developed, well nourished thin WF, in no acute distress. Head: Normocephalic, atraumatic, sclera non-icteric, no xanthomas, nares are without discharge. Neck: JVP 10-12 cm Lungs: Coarse crackles at bases. No wheezes or rhonchi. Heart: Irreg irreg, slightly tachy, S1 S2 without murmurs, rubs, or gallops.  Abdomen: Soft, non-tender, non-distended with normoactive bowel sounds. No rebound/guarding. Extremities: No clubbing or cyanosis. No edema. Distal pedal pulses are 2+ and equal bilaterally. Neuro: Alert and oriented X 3. Moves all extremities spontaneously. Psych:  Responds to questions appropriately with a normal affect.   Intake/Output Summary (Last 24 hours) at 01/11/14 0811 Last data filed at 01/10/14 1240  Gross per 24 hour  Intake      0 ml  Output    400 ml  Net   -400 ml    Inpatient Medications:  . allopurinol  300 mg Oral Daily  . diltiazem  240 mg Oral Daily  . furosemide  40 mg Intravenous Daily  . levothyroxine  50 mcg Oral QAC breakfast  . losartan  100 mg Oral Daily  . polyethylene glycol  17 g Oral Daily  . potassium chloride  10 mEq Oral BID  . sodium chloride  3 mL Intravenous Q12H  . Warfarin - Pharmacist Dosing Inpatient   Does not apply q1800   Infusions:  . diltiazem (CARDIZEM) infusion 5 mg/hr (01/11/14 0131)    Labs:  Recent Labs  01/09/14 1125  01/10/14 0126  NA 136* 137  K 3.9 4.0  CL 98 100  CO2 23 23  GLUCOSE 85 85  BUN 15 17  CREATININE 0.79 0.93  CALCIUM 9.4 9.0    Recent Labs  01/09/14 1125  AST 29  ALT 10  ALKPHOS 65  BILITOT 0.7  PROT 7.7  ALBUMIN 3.5    Recent Labs  01/09/14 1125  WBC 5.5  NEUTROABS 3.2  HGB 12.1  HCT 35.9*  MCV 97.8  PLT 188    Recent Labs  01/09/14 1125 01/09/14 1850 01/10/14 0126  TROPONINI <0.30 <0.30 <0.30   No components found with this basename: POCBNP,  No results found for this basename: HGBA1C,  in the last 72 hours   Radiology/Studies:  Dg Chest 2 View  01/10/2014   CLINICAL DATA:  Shortness of breath, CHF, history atrial fibrillation, hypertension, stroke, melanoma  EXAM: CHEST  2 VIEW  COMPARISON:  06/10/2007  FINDINGS: Enlargement of cardiac silhouette.  Atherosclerotic calcification aorta.  Slight pulmonary vascular congestion.  Mediastinal contours otherwise normal.  Emphysematous and bronchitic changes consistent with COPD.  Bibasilar atelectasis.  No definite infiltrate, pleural effusion or pneumothorax.  Bones diffusely demineralized with thoracolumbar scoliosis.  IMPRESSION: COPD changes with bibasilar atelectasis.  Enlargement of cardiac silhouette with pulmonary vascular congestion.   Electronically Signed   By: Lavonia Dana M.D.   On: 01/10/2014 08:08     Assessment and Plan  1.  Acute on chronic diastolic CHF 2. Permanent atrial fibrillation with elevated rates, also recent history pauses </=3 sec by Holter 10/2013, taken off digoxin/atenolol and changed to lower dose of Toprol 3. HTN with hypotension this admission 4. Possible degree of AS by echo 10/2013 5. CVA 2008 with resultant R leg weakness  Will discuss med regimen with MD. Still with crackles on exam. I/Os, daily weights not accurate. Will reinforce these orders. Labs pending this morning. Wondering if we need to involve EP given her recent issues with pauses while on higher doses of AVN blocking  agents, and now with tachycardia in the setting of borderline pressures.  Signed, Melina Copa PA-C  Patient seen with PA, agree with the above note.  She remains volume overloaded on exam and HR still high.  Now off diltiazem gtt.  - Would resume diltiazem gtt for rate control for now.  - Increase Lasix to 40 mg IV every 8 hours for diuresis today, reassess in am.  - Out of bed/PT - Follow for more bradycardia, if continues to be an issue may need PCM for tachy-brady.   Larey Dresser 01/11/2014 9:26 AM

## 2014-01-11 NOTE — Progress Notes (Signed)
Pt's HR sustaining 120's-130's at rest. Orders given to restart Diltiazem gtt at 5 mg/hr with SBP > 80. Pt resting at this time and asymptomatic. Will continue to monitor pt.   Prescilla Sours, Therapist, sports

## 2014-01-11 NOTE — Evaluation (Signed)
Physical Therapy Evaluation Patient Details Name: [name] MRN: [id number] DOB: 11-15-1925 Today's Date: 01/11/2014   History of Present Illness  pt admitted with acute on chronic CHF  Clinical Impression  Pt with deficits in balance, strength and activity tolerance.  Will benefit from skilled PT services to address deficits and increase functional independence.  Discussed using a rollator RW with pt and daughter as pt with cardiac issues and she would benefit from a device that she could sit and rest when needed.  Both pt and daughter agree with suggestion, case manager aware.    Follow Up Recommendations Home health PT    Equipment Recommendations   (rollator walker)    Recommendations for Other Services       Precautions / Restrictions Restrictions Weight Bearing Restrictions: No      Mobility  Bed Mobility Overal bed mobility: Needs Assistance Bed Mobility: Supine to Sit     Supine to sit: Min assist     General bed mobility comments: increased time, assist to scoot hips forward in bed  Transfers Overall transfer level: Needs assistance Equipment used: Straight cane Transfers: Sit to/from Stand Sit to Stand: Min guard         General transfer comment: steady assist for transition from sit to stand  Ambulation/Gait Ambulation/Gait assistance: Min guard Ambulation Distance (Feet): 75 Feet Assistive device: Straight cane       General Gait Details: pt with slow cadence, decreased stride length, forward flexed posture, DOE 1/4.  pt gait on room air with spO2 93-97%.  Pt requires 1 standing rest break during gait  Stairs            Wheelchair Mobility    Modified Rankin (Stroke Patients Only)       Balance                                             Pertinent Vitals/Pain No c/o pain.  spO2 93% - 97% on room air    Home Living Family/patient expects to be discharged to:: Private residence Living Arrangements:  Alone Available Help at Discharge: Family;Available PRN/intermittently Type of Home: Independent living facility Home Access: Level entry     Home Layout: One level Home Equipment: Cane - single point      Prior Function Level of Independence: Independent with assistive device(s)         Comments: pt states she uses cane and "furniture walks"     Hand Dominance        Extremity/Trunk Assessment   Upper Extremity Assessment: Generalized weakness           Lower Extremity Assessment: Generalized weakness         Communication   Communication: No difficulties  Cognition Arousal/Alertness: Awake/alert Behavior During Therapy: WFL for tasks assessed/performed Overall Cognitive Status: Within Functional Limits for tasks assessed                      General Comments      Exercises        Assessment/Plan    PT Assessment Patient needs continued PT services  PT Diagnosis Difficulty walking;Generalized weakness   PT Problem List Decreased strength;Decreased balance;Decreased mobility;Decreased activity tolerance;Decreased safety awareness;Cardiopulmonary status limiting activity  PT Treatment Interventions Functional mobility training;DME instruction;Balance training;Patient/family education;Modalities;Gait training;Therapeutic exercise;Neuromuscular re-education;Therapeutic activities   PT Goals (Current goals  can be found in the Care Plan section) Acute Rehab PT Goals Patient Stated Goal: get up and be active PT Goal Formulation: With patient Time For Goal Achievement: 01/18/14 Potential to Achieve Goals: Good    Frequency Min 3X/week   Barriers to discharge        Co-evaluation               End of Session   Activity Tolerance: Patient tolerated treatment well Patient left: in chair;with call bell/phone within reach;with family/visitor present Nurse Communication: Mobility status         Time: 1050-1110 PT Time Calculation  (min): 20 min   Charges:   PT Evaluation $Initial PT Evaluation Tier I: 1 Procedure PT Treatments $Gait Training: 8-22 mins   PT G Codes:          Kennith Gain 01/11/2014, 11:19 AM

## 2014-01-11 NOTE — Plan of Care (Signed)
Problem: Phase II Progression Outcomes Goal: Fluid volume status improved IV lasix increased to three times a day

## 2014-01-12 LAB — BASIC METABOLIC PANEL
BUN: 13 mg/dL (ref 6–23)
CO2: 25 mEq/L (ref 19–32)
Calcium: 9.1 mg/dL (ref 8.4–10.5)
Chloride: 101 mEq/L (ref 96–112)
Creatinine, Ser: 0.96 mg/dL (ref 0.50–1.10)
GFR, EST AFRICAN AMERICAN: 61 mL/min — AB (ref >90)
GFR, EST NON AFRICAN AMERICAN: 52 mL/min — AB (ref >90)
Glucose, Bld: 75 mg/dL (ref 70–99)
POTASSIUM: 3.6 meq/L — AB (ref 3.7–5.3)
SODIUM: 139 meq/L (ref 137–147)

## 2014-01-12 LAB — PROTIME-INR
INR: 2.11 — ABNORMAL HIGH (ref 0.00–1.49)
Prothrombin Time: 23 seconds — ABNORMAL HIGH (ref 11.6–15.2)

## 2014-01-12 MED ORDER — DILTIAZEM HCL ER COATED BEADS 240 MG PO CP24
240.0000 mg | ORAL_CAPSULE | Freq: Every day | ORAL | Status: DC
  Administered 2014-01-12 – 2014-01-15 (×4): 240 mg via ORAL
  Filled 2014-01-12 (×4): qty 1

## 2014-01-12 MED ORDER — WARFARIN SODIUM 5 MG PO TABS
5.0000 mg | ORAL_TABLET | Freq: Once | ORAL | Status: AC
  Administered 2014-01-12: 5 mg via ORAL
  Filled 2014-01-12: qty 1

## 2014-01-12 MED ORDER — POTASSIUM CHLORIDE CRYS ER 10 MEQ PO TBCR
20.0000 meq | EXTENDED_RELEASE_TABLET | Freq: Two times a day (BID) | ORAL | Status: DC
  Administered 2014-01-12 – 2014-01-15 (×7): 20 meq via ORAL
  Filled 2014-01-12 (×9): qty 2

## 2014-01-12 MED ORDER — TRAZODONE HCL 50 MG PO TABS
50.0000 mg | ORAL_TABLET | Freq: Every evening | ORAL | Status: DC | PRN
  Administered 2014-01-12 – 2014-01-13 (×2): 50 mg via ORAL
  Filled 2014-01-12 (×2): qty 1

## 2014-01-12 MED ORDER — POTASSIUM CHLORIDE CRYS ER 20 MEQ PO TBCR: 20.0000 meq | EXTENDED_RELEASE_TABLET | Freq: Two times a day (BID) | ORAL | Status: DC

## 2014-01-12 MED ORDER — METOPROLOL SUCCINATE ER 25 MG PO TB24
25.0000 mg | ORAL_TABLET | Freq: Every evening | ORAL | Status: DC
  Administered 2014-01-12 – 2014-01-14 (×3): 25 mg via ORAL
  Filled 2014-01-12 (×4): qty 1

## 2014-01-12 NOTE — Progress Notes (Signed)
Patient: [name] / Admit Date: 01/09/2014 / Date of Encounter: 01/12/2014, 8:36 AM  Subjective  Breathing a little better, still not yet at baseline. No CP.   Objective   Telemetry: AF rates 115 presently, range 110-150s, no significant bradycardia noted  Physical Exam: Blood pressure 109/74, pulse 119, temperature 98.1 F (36.7 C), temperature source Oral, resp. rate 18, height 5\' 3"  (1.6 m), weight 106 lb 11.2 oz (48.399 kg), SpO2 96.00%. General: Well developed, well nourished thin WF, in no acute distress.  Head: Normocephalic, atraumatic, sclera non-icteric, no xanthomas, nares are without discharge.  Neck: JVP 8-9 cm. Lungs: Coarse crackles at bases. No wheezes or rhonchi.  Heart: Irreg irreg, tachy, S1 S2 without murmurs, rubs, or gallops.  Abdomen: Soft, non-tender, non-distended with normoactive bowel sounds. No rebound/guarding.  Extremities: No clubbing or cyanosis. No edema. Distal pedal pulses are 2+ and equal bilaterally.  Neuro: Alert and oriented X 3. Moves all extremities spontaneously.  Psych: Responds to questions appropriately with a normal affect.    Intake/Output Summary (Last 24 hours) at 01/12/14 0836 Last data filed at 01/12/14 0700  Gross per 24 hour  Intake    360 ml  Output   2400 ml  Net  -2040 ml    Inpatient Medications:  . allopurinol  300 mg Oral Daily  . furosemide  40 mg Intravenous Q8H  . levothyroxine  50 mcg Oral QAC breakfast  . polyethylene glycol  17 g Oral Daily  . potassium chloride  10 mEq Oral BID  . sodium chloride  3 mL Intravenous Q12H  . Warfarin - Pharmacist Dosing Inpatient   Does not apply q1800   Infusions:  . diltiazem (CARDIZEM) infusion 5 mg/hr (01/12/14 0300)    Labs:  Recent Labs  01/11/14 0720 01/12/14 0612  NA 137 139  K 4.8 3.6*  CL 100 101  CO2 25 25  GLUCOSE 79 75  BUN 15 13  CREATININE 0.96 0.96  CALCIUM 9.0 9.1    Recent Labs  01/09/14 1125  AST 29  ALT 10  ALKPHOS 65  BILITOT  0.7  PROT 7.7  ALBUMIN 3.5    Recent Labs  01/09/14 1125  WBC 5.5  NEUTROABS 3.2  HGB 12.1  HCT 35.9*  MCV 97.8  PLT 188    Recent Labs  01/09/14 1125 01/09/14 1850 01/10/14 0126  TROPONINI <0.30 <0.30 <0.30     Radiology/Studies:  Dg Chest 2 View 01/10/2014   CLINICAL DATA:  Shortness of breath, CHF, history atrial fibrillation, hypertension, stroke, melanoma  EXAM: CHEST  2 VIEW  COMPARISON:  06/10/2007  FINDINGS: Enlargement of cardiac silhouette.  Atherosclerotic calcification aorta.  Slight pulmonary vascular congestion.  Mediastinal contours otherwise normal.  Emphysematous and bronchitic changes consistent with COPD.  Bibasilar atelectasis.  No definite infiltrate, pleural effusion or pneumothorax.  Bones diffusely demineralized with thoracolumbar scoliosis.  IMPRESSION: COPD changes with bibasilar atelectasis.  Enlargement of cardiac silhouette with pulmonary vascular congestion.   Electronically Signed   By: Lavonia Dana M.D.   On: 01/10/2014 08:08     Assessment and Plan  1. Acute on chronic diastolic CHF  2. Permanent atrial fibrillation with elevated rates, also recent history pauses </=3 sec by Holter 10/2013, taken off digoxin/atenolol and changed to lower dose of Toprol  3. HTN with hypotension this admission  4. Possible degree of AS by echo 10/2013  5. CVA 2008 with resultant R leg weakness 6. Mild confusion last night (upon wakening in the  middle of the night), ? related to Percocet, no focal abnormalities  Significant diuresis yesterday - BP tolerated this. Remains on dilt gtt at 5mg /hr. Increase KCl to 38meq BID. Will discuss progression of regimen with MD. Home health PT & rolling walker ordered per PT recs.  Signed, Melina Copa PA-C  Patient seen with PA, agree with the above note.  HR still in 100s-110s but on very low dose diltiazem gtt.  She diuresed well yesterday on IV lasix but still has some volume overload.  - I will stop IV diltiazem and start  diltiazem CD 240 mg daily in am and Toprol XL 25 mg daily in pm.  - Continue IV Lasix one more day, to po tomorrow (40 mg daily => was on 20 mg daily prior to admission).   Larey Dresser 01/12/2014 9:06 AM

## 2014-01-12 NOTE — Progress Notes (Signed)
Physical Therapy Treatment Patient Details Name: [name] MRN: [id number] DOB: 1926/09/21 Today's Date: 17-Jan-2014    History of Present Illness pt admitted with acute on chronic CHF    PT Comments    Pt able to increase gait distance with decreased fatigue using RW vs SPC.  Discussed benefits of RW for energy conservation, pt agreeable and hopeful she can get 4 wheeled walker for home.  Pt with 1/4 DOE with gait training, recovers well with cues for deep breathing.  Follow Up Recommendations  Home health PT     Equipment Recommendations   (4 wheeled RW)    Recommendations for Other Services       Precautions / Restrictions Restrictions Weight Bearing Restrictions: No    Mobility  Bed Mobility         Supine to sit: Supervision        Transfers   Equipment used: Rolling walker (2 wheeled)   Sit to Stand: Supervision         General transfer comment: pt with improved independence using RW vs cane, cues for hand placement  Ambulation/Gait Ambulation/Gait assistance: Supervision Ambulation Distance (Feet): 150 Feet (x 2) Assistive device: Rolling walker (2 wheeled)       General Gait Details: pt requires cues to stay close to RW, good problem solving through obstacles, cues for technique with side stepping.  pt able to increase distance significantly with RW vs SPC, DOE 1/4   Stairs            Wheelchair Mobility    Modified Rankin (Stroke Patients Only)       Balance                                    Cognition Arousal/Alertness: Awake/alert Behavior During Therapy: WFL for tasks assessed/performed Overall Cognitive Status: Within Functional Limits for tasks assessed                      Exercises      General Comments        Pertinent Vitals/Pain No c/o pain    Home Living                      Prior Function            PT Goals (current goals can now be found in the care plan  section) Progress towards PT goals: Progressing toward goals    Frequency  Min 3X/week    PT Plan      Co-evaluation             End of Session   Activity Tolerance: Patient tolerated treatment well Patient left: in chair;with call bell/phone within reach;with family/visitor present     Time: 9390-3009 PT Time Calculation (min): 26 min  Charges:  $Gait Training: 23-37 mins                    G Codes:      [name] 01-17-14, 11:10 AM

## 2014-01-12 NOTE — Progress Notes (Signed)
Utilization review completed.  

## 2014-01-12 NOTE — Progress Notes (Signed)
Patient woke this am slightly confused. She pulled her iv out, cath intact. Hr 130's . I spoke with tele. Patient denied chest pain or nausea. IV restarted to right forearm and infusing per order. Pt. Reoriented, bed alarm on will continue to monitor. Charlann Noss  01/12/2014

## 2014-01-12 NOTE — Progress Notes (Addendum)
   CARE MANAGEMENT NOTE 01/12/2014  Patient:  [name], [name]   Account Number:  [id number]  Date Initiated:  01/12/2014  Documentation initiated by:  [facility]  Subjective/Objective Assessment:   Afib, CHF     Action/Plan:   lives alone   Anticipated DC Date:  01/13/2014   Anticipated DC Plan:  [place]  CM consult      [facility] Choice  HOME HEALTH   Choice offered to / List presented to:  C-4 Adult Children   DME arranged  [place]  3-N-1      DME agency  [place] arranged  [place].   Status of service:  Completed, signed off Medicare Important Message given?   (If response is "NO", the following Medicare IM given date fields will be blank) Date Medicare IM given:   Date Additional Medicare IM given:    Discharge Disposition:  [place]  Per UR Regulation:    If discussed at Long Length of Stay Meetings, dates discussed:    Comments:  01/12/2014 1545 NCM spoke to pt and offered choice for [facility]. Gave pemission to speak to dtr, Adron Bene # [phone]. Dtr requesting [facility] for [facility]. Requesting 3n1 and Rollator for home. Pt states she uses her cane at home. Notified [facility] for DME and HH.  Dtrs will be with pt for a few days to assist with care. Jonnie Finner RN Case Mgmt phone [phone]

## 2014-01-12 NOTE — Progress Notes (Signed)
ANTICOAGULATION CONSULT NOTE - Follow Up Consult  Pharmacy Consult for coumadin Indication: atrial fibrillation  Allergies  Allergen Reactions  . Amlodipine     Has significant edema  . Amoxicillin   . Hydrocodone-Acetaminophen   . Nitrofurantoin   . Sulfonamide Derivatives     Patient Measurements: Height: 5\' 3"  (160 cm) Weight: 106 lb 11.2 oz (48.399 kg) (scale A) IBW/kg (Calculated) : 52.4  Vital Signs: Temp: 98.1 F (36.7 C) (04/10 0434) Temp src: Oral (04/10 0434) BP: 109/74 mmHg (04/10 0434) Pulse Rate: 119 (04/10 0434)  Labs:  Recent Labs  01/09/14 1850 01/10/14 0126 01/11/14 0720 01/12/14 0612  LABPROT  --  32.4* 24.6* 23.0*  INR  --  3.31* 2.31* 2.11*  CREATININE  --  0.93 0.96 0.96  TROPONINI <0.30 <0.30  --   --     Estimated Creatinine Clearance: 32.7 ml/min (by C-G formula based on Cr of 0.96).  Assessment: Patient is an 80 y.o F on coumadin for Afib.  INR continues to trend down but only decreased slightly from 2.31 to 2.11 after 5mg  dose given last night.  No bleeding documented.  Goal of Therapy:  INR 2-3    Plan:  1) coumadin 5mg  PO x1 today  Alyssamae Klinck P Taimur Fier 01/12/2014,2:15 PM

## 2014-01-13 DIAGNOSIS — J069 Acute upper respiratory infection, unspecified: Secondary | ICD-10-CM

## 2014-01-13 DIAGNOSIS — I509 Heart failure, unspecified: Secondary | ICD-10-CM

## 2014-01-13 DIAGNOSIS — I5032 Chronic diastolic (congestive) heart failure: Secondary | ICD-10-CM

## 2014-01-13 LAB — BASIC METABOLIC PANEL
BUN: 14 mg/dL (ref 6–23)
CHLORIDE: 95 meq/L — AB (ref 96–112)
CO2: 25 meq/L (ref 19–32)
CREATININE: 0.87 mg/dL (ref 0.50–1.10)
Calcium: 9.5 mg/dL (ref 8.4–10.5)
GFR calc Af Amer: 68 mL/min — ABNORMAL LOW (ref >90)
GFR calc non Af Amer: 59 mL/min — ABNORMAL LOW (ref >90)
Glucose, Bld: 83 mg/dL (ref 70–99)
Potassium: 3.6 mEq/L — ABNORMAL LOW (ref 3.7–5.3)
Sodium: 137 mEq/L (ref 137–147)

## 2014-01-13 LAB — PROTIME-INR
INR: 2.12 — ABNORMAL HIGH (ref 0.00–1.49)
Prothrombin Time: 23.1 seconds — ABNORMAL HIGH (ref 11.6–15.2)

## 2014-01-13 MED ORDER — WARFARIN SODIUM 5 MG PO TABS
5.0000 mg | ORAL_TABLET | Freq: Once | ORAL | Status: AC
  Administered 2014-01-13: 5 mg via ORAL
  Filled 2014-01-13: qty 1

## 2014-01-13 MED ORDER — DIGOXIN 125 MCG PO TABS
0.1250 mg | ORAL_TABLET | Freq: Every day | ORAL | Status: DC
  Administered 2014-01-13 – 2014-01-15 (×3): 0.125 mg via ORAL
  Filled 2014-01-13 (×3): qty 1

## 2014-01-13 NOTE — Progress Notes (Signed)
ANTICOAGULATION CONSULT NOTE - Follow Up Consult  Pharmacy Consult for coumadin Indication: atrial fibrillation  Allergies  Allergen Reactions  . Amlodipine     Has significant edema  . Amoxicillin   . Hydrocodone-Acetaminophen   . Nitrofurantoin   . Sulfonamide Derivatives     Patient Measurements: Height: 5\' 3"  (160 cm) Weight: 103 lb 13.4 oz (47.1 kg) (standing scale) IBW/kg (Calculated) : 52.4   Vital Signs: Temp: 97.6 F (36.4 C) (04/11 0206) Temp src: Oral (04/11 0206) BP: 110/85 mmHg (04/11 0206) Pulse Rate: 122 (04/11 0206)  Labs:  Recent Labs  01/11/14 0720 01/12/14 0612 01/13/14 0541  LABPROT 24.6* 23.0* 23.1*  INR 2.31* 2.11* 2.12*  CREATININE 0.96 0.96 0.87    Estimated Creatinine Clearance: 35.2 ml/min (by C-G formula based on Cr of 0.87).  Assessment: Patient is an 80 y.o F on coumadin for Afib.  INR is at goal with 2.12 today.  No bleeding documented.   Goal of Therapy:  INR 2-3    Plan:  1) coumadin 5mg  PO x1 today  Merlyn Bollen P Jamiya Nims 01/13/2014,8:46 AM

## 2014-01-13 NOTE — Progress Notes (Signed)
   Patient: [name] / Admit Date: 01/09/2014 / Date of Encounter: 01/13/2014, 11:29 AM  Subjective  Breathing a little better, still not yet at baseline. No CP.   Objective   Telemetry: AF rates 115 presently, range 110-150s, no significant bradycardia noted  Physical Exam: Blood pressure 100/64, pulse 122, temperature 97.5 F (36.4 C), temperature source Oral, resp. rate 18, height 5\' 3"  (1.6 m), weight 103 lb 13.4 oz (47.1 kg), SpO2 97.00%. General: Well developed, well nourished thin WF, in no acute distress.  Head: Normocephalic, atraumatic, sclera non-icteric, no xanthomas, nares are without discharge.  Neck: JVP 8-9 cm. Lungs: Coarse crackles at bases. No wheezes or rhonchi.  Heart: Irreg irreg, tachy, S1 S2 without murmurs, rubs, or gallops.  Abdomen: Soft, non-tender, non-distended with normoactive bowel sounds. No rebound/guarding.  Extremities: No clubbing or cyanosis. No edema. Distal pedal pulses are 2+ and equal bilaterally.  Neuro: Alert and oriented X 3. Moves all extremities spontaneously.  Psych: Responds to questions appropriately with a normal affect.  Intake/Output Summary (Last 24 hours) at 01/13/14 1129 Last data filed at 01/13/14 0644  Gross per 24 hour  Intake    120 ml  Output   1425 ml  Net  -1305 ml   Inpatient Medications:  . allopurinol  300 mg Oral Daily  . diltiazem  240 mg Oral Daily  . furosemide  40 mg Intravenous Q8H  . levothyroxine  50 mcg Oral QAC breakfast  . metoprolol succinate  25 mg Oral QPM  . polyethylene glycol  17 g Oral Daily  . potassium chloride  20 mEq Oral BID  . sodium chloride  3 mL Intravenous Q12H  . warfarin  5 mg Oral ONCE-1800  . Warfarin - Pharmacist Dosing Inpatient   Does not apply q1800   Labs:  Recent Labs  01/12/14 0612 01/13/14 0541  NA 139 137  K 3.6* 3.6*  CL 101 95*  CO2 25 25  GLUCOSE 75 83  BUN 13 14  CREATININE 0.96 0.87  CALCIUM 9.1 9.5   Radiology/Studies:  Dg Chest 2  View 01/10/2014   CLINICAL DATA:  Shortness of breath, CHF, history atrial fibrillation,  IMPRESSION: COPD changes with bibasilar atelectasis.  Enlargement of cardiac silhouette with pulmonary vascular congestion.     Tele: A-fib with RVR, HR 120-130 BPM     Assessment and Plan   1. Acute on chronic diastolic CHF  2. Permanent atrial fibrillation with elevated rates, also recent history pauses </=3 sec by Holter 10/2013, taken off digoxin/atenolol and changed to lower dose of Toprol  3. HTN with hypotension this admission  4. Possible degree of AS by echo 10/2013  5. CVA 2008 with resultant R leg weakness 6. Mild confusion last night (upon wakening in the middle of the night), ? related to Percocet, no focal abnormalities  Significant diuresis yesterday -1.3 L. Switched to Cardizem 240 mg po daily from drip yesterday, still persistently tachycardic. BP as low as 85 mmHg. We will add Digoxin 0.125 mg p daily.  Continue IV Lasix one more day, to po tomorrow (40 mg daily => was on 20 mg daily prior to admission).   [name] 01/13/2014 11:29 AM

## 2014-01-14 ENCOUNTER — Inpatient Hospital Stay (HOSPITAL_COMMUNITY): Payer: Medicare Other

## 2014-01-14 LAB — BASIC METABOLIC PANEL
BUN: 13 mg/dL (ref 6–23)
CALCIUM: 9.5 mg/dL (ref 8.4–10.5)
CHLORIDE: 101 meq/L (ref 96–112)
CO2: 25 meq/L (ref 19–32)
Creatinine, Ser: 0.89 mg/dL (ref 0.50–1.10)
GFR calc non Af Amer: 57 mL/min — ABNORMAL LOW (ref >90)
GFR, EST AFRICAN AMERICAN: 67 mL/min — AB (ref >90)
Glucose, Bld: 84 mg/dL (ref 70–99)
POTASSIUM: 3.7 meq/L (ref 3.7–5.3)
Sodium: 139 mEq/L (ref 137–147)

## 2014-01-14 LAB — PRO B NATRIURETIC PEPTIDE: Pro B Natriuretic peptide (BNP): 5692 pg/mL — ABNORMAL HIGH (ref 0–450)

## 2014-01-14 LAB — PROTIME-INR
INR: 2.23 — ABNORMAL HIGH (ref 0.00–1.49)
Prothrombin Time: 24 seconds — ABNORMAL HIGH (ref 11.6–15.2)

## 2014-01-14 MED ORDER — FUROSEMIDE 40 MG PO TABS
40.0000 mg | ORAL_TABLET | Freq: Two times a day (BID) | ORAL | Status: DC
  Administered 2014-01-14 – 2014-01-15 (×2): 40 mg via ORAL
  Filled 2014-01-14 (×4): qty 1

## 2014-01-14 MED ORDER — WARFARIN SODIUM 5 MG PO TABS
5.0000 mg | ORAL_TABLET | Freq: Once | ORAL | Status: AC
  Administered 2014-01-14: 5 mg via ORAL
  Filled 2014-01-14: qty 1

## 2014-01-14 NOTE — Progress Notes (Signed)
    Subjective: Feeling better.  No orthopnea  Objective: Vital signs in last 24 hours: Temp:  [97.6 F (36.4 C)-97.9 F (36.6 C)] 97.9 F (36.6 C) (04/12 0539) Pulse Rate:  [92-110] 92 (04/12 0935) Resp:  [15-18] 15 (04/12 0539) BP: (95-107)/(69-72) 107/72 mmHg (04/12 0539) SpO2:  [95 %-98 %] 95 % (04/12 0539) Weight:  [98 lb 9.6 oz (44.725 kg)] 98 lb 9.6 oz (44.725 kg) (04/12 0700) Last BM Date: 01/12/14  Intake/Output from previous day: 04/11 0701 - 04/12 0700 In: 1560 [P.O.:1560] Out: 1600 [Urine:1600] Intake/Output this shift:    Medications . allopurinol  300 mg Oral Daily  . digoxin  0.125 mg Oral Daily  . diltiazem  240 mg Oral Daily  . furosemide  40 mg Intravenous Q8H  . furosemide  40 mg Oral BID  . levothyroxine  50 mcg Oral QAC breakfast  . metoprolol succinate  25 mg Oral QPM  . polyethylene glycol  17 g Oral Daily  . potassium chloride  20 mEq Oral BID  . sodium chloride  3 mL Intravenous Q12H  . warfarin  5 mg Oral ONCE-1800  . Warfarin - Pharmacist Dosing Inpatient   Does not apply q1800   PE: General appearance: alert, cooperative and no distress Lungs: Coarse crackles bilateral. Heart: irregularly irregular rhythm Abdomen: soft, non-tender; bowel sounds normal; no masses,  no organomegaly Extremities: No LEE Pulses: 2+ and symmetric Skin: Warm and dry Neurologic: Grossly normal  Lab Results:  No results found for this basename: WBC, HGB, HCT, PLT,  in the last 72 hours BMET  Recent Labs  01/12/14 0612 01/13/14 0541 01/14/14 0625  NA 139 137 139  K 3.6* 3.6* 3.7  CL 101 95* 101  CO2 25 25 25   GLUCOSE 75 83 84  BUN 13 14 13   CREATININE 0.96 0.87 0.89  CALCIUM 9.1 9.5 9.5   PT/INR  Recent Labs  01/12/14 0612 01/13/14 0541 01/14/14 0625  LABPROT 23.0* 23.1* 24.0*  INR 2.11* 2.12* 2.23*     Assessment/Plan   1. Acute on chronic diastolic CHF  2. Permanent atrial fibrillation with elevated rates, also recent history pauses  </=3 sec by Holter 10/2013, taken off digoxin/atenolol and changed to lower dose of Toprol  3. HTN with hypotension this admission  4. Possible degree of AS by echo 10/2013  5. CVA 2008 with resultant R leg weakness  6. Mild confusion last night (upon wakening in the middle of the night), ? related to Percocet, no focal abnormalities  Plan:   Net fluids -23ml/-4.9L.  Scr stable.   INR therapeutic.  Will check a CXR today and change to PO lasix 40mg  BID.  Possible DC  home tomorrow   LOS: 5 days   Tarri Fuller PA-C 01/14/2014 11:11 AM  The patient was seen, examined and discussed with Tarri Fuller, PA-C and I agree with the above.   Significant diuresis since the admission -5L, minimal since yesterday, the patient seems asymptomatic, laying flat in bed, however significant crackles on physical exam. We will re-check CXR, BNP. Switch lasix to PO. Potential discharge tomorrow. HR is now controlled after initiation of Digoxin.   [name] 01/14/2014

## 2014-01-14 NOTE — Progress Notes (Signed)
ANTICOAGULATION CONSULT NOTE - Follow Up Consult  Pharmacy Consult for coumadin Indication: atrial fibrillation  Allergies  Allergen Reactions  . Amlodipine     Has significant edema  . Amoxicillin   . Hydrocodone-Acetaminophen   . Nitrofurantoin   . Sulfonamide Derivatives     Patient Measurements: Height: 5\' 3"  (160 cm) Weight: 103 lb 13.4 oz (47.1 kg) (standing scale) IBW/kg (Calculated) : 52.4   Vital Signs: Temp: 97.9 F (36.6 C) (04/12 0539) Temp src: Oral (04/12 0539) BP: 107/72 mmHg (04/12 0539) Pulse Rate: 103 (04/12 0539)  Labs:  Recent Labs  01/12/14 0612 01/13/14 0541 01/14/14 0625  LABPROT 23.0* 23.1* 24.0*  INR 2.11* 2.12* 2.23*  CREATININE 0.96 0.87  --     Estimated Creatinine Clearance: 35.2 ml/min (by C-G formula based on Cr of 0.87).  Assessment: Patient is an 80 y.o F on coumadin for Afib.  INR is therapeutic at 2.23 this morning.  No bleeding documented.  Goal of Therapy:  INR 2-3    Plan:  1) coumadin 5 mg PO x1 today  [name] P Abhay Godbolt 01/14/2014,7:40 AM

## 2014-01-15 ENCOUNTER — Other Ambulatory Visit: Payer: Self-pay | Admitting: Nurse Practitioner

## 2014-01-15 ENCOUNTER — Encounter (HOSPITAL_COMMUNITY): Payer: Self-pay | Admitting: Nurse Practitioner

## 2014-01-15 DIAGNOSIS — I4891 Unspecified atrial fibrillation: Secondary | ICD-10-CM

## 2014-01-15 DIAGNOSIS — R0609 Other forms of dyspnea: Secondary | ICD-10-CM

## 2014-01-15 DIAGNOSIS — R0989 Other specified symptoms and signs involving the circulatory and respiratory systems: Secondary | ICD-10-CM

## 2014-01-15 LAB — BASIC METABOLIC PANEL
BUN: 13 mg/dL (ref 6–23)
CALCIUM: 10.1 mg/dL (ref 8.4–10.5)
CO2: 28 mEq/L (ref 19–32)
CREATININE: 0.89 mg/dL (ref 0.50–1.10)
Chloride: 98 mEq/L (ref 96–112)
GFR calc Af Amer: 67 mL/min — ABNORMAL LOW (ref >90)
GFR, EST NON AFRICAN AMERICAN: 57 mL/min — AB (ref >90)
GLUCOSE: 88 mg/dL (ref 70–99)
Potassium: 3.7 mEq/L (ref 3.7–5.3)
Sodium: 139 mEq/L (ref 137–147)

## 2014-01-15 LAB — PROTIME-INR
INR: 2.14 — ABNORMAL HIGH (ref 0.00–1.49)
PROTHROMBIN TIME: 23.2 s — AB (ref 11.6–15.2)

## 2014-01-15 MED ORDER — DIGOXIN 125 MCG PO TABS: 0.1250 mg | ORAL_TABLET | Freq: Every day | ORAL | Status: DC

## 2014-01-15 MED ORDER — POTASSIUM CHLORIDE CRYS ER 10 MEQ PO TBCR: 10.0000 meq | EXTENDED_RELEASE_TABLET | Freq: Two times a day (BID) | ORAL | Status: DC

## 2014-01-15 MED ORDER — METOPROLOL SUCCINATE ER 25 MG PO TB24: 25.0000 mg | ORAL_TABLET | Freq: Every evening | ORAL | Status: DC

## 2014-01-15 MED ORDER — DILTIAZEM HCL ER COATED BEADS 240 MG PO CP24: 240.0000 mg | ORAL_CAPSULE | Freq: Every day | ORAL | Status: DC

## 2014-01-15 NOTE — Discharge Summary (Signed)
Discharge Summary   Patient ID: [name],  MRN: [id number], DOB/AGE: Oct 07, 1925 80 y.o.  Admit date: 01/09/2014 Discharge date: 01/15/2014  Primary Care Provider: Alysia Penna A Primary Cardiologist: Einar Crow, MD   Discharge Diagnoses Principal Problem:   Acute on chronic diastolic heart failure  **Net negative diuresis of 6.5 L during this admission with reduction in weight from 108 lbs on admission to 96 lbs on discharge.  Active Problems:   HYPERTENSION   Atrial fibrillation  **With difficult to control rate.  **Discharge regimen includes Diltiazem cd 240mg , Toprol XL 25mg  daily, and digoxin 0.125 mg daily.  **48 hr holter arranged as outpt to assess for recurrent pauses.   HYPOTHYROIDISM   HYPERLIPIDEMIA   OSTEOARTHRITIS   Allergies Allergies  Allergen Reactions  . Amlodipine     Has significant edema  . Amoxicillin   . Hydrocodone-Acetaminophen   . Nitrofurantoin   . Sulfonamide Derivatives    Procedures  None  History of Present Illness  80 y/o female with a h/o permanent atrial fibrillation and chronic diastolic CHF.  Earlier this year, she required adjustment of her rate-controlling agents secondary to bradycardia and sub-3 second pauses.  As a result, digoxin was discontinued and she was switched from atenolol to toprol xl.  She initially did well but then began to experience progressive dyspnea.  She was seen in clinic and metoprolol was switched to diltiazem but unfortunately, she continued to experience DOE.  She was seen in clinic on 4/7 and was found to have heart rates in the 1-forties with evidence of volume overload on exam.  She was admitted for further evaluation and management of acute on chronic diastolic CHF and rapid afib.  Hospital Course  Following admission, patient was placed on IV lasix and IV diltiazem.  Though she diuresed well, her HR remained elevated into the 130's.  Oral beta blocker therapy was added to calcium channel blocker  therapy, which was converted to an oral formulation @ 240 mg daily, and low dose digoxin was subsequently resumed @ 0.125 mg daily.  On this combination of rate controlling agents, her heart rates have been in the 90's at rest.  She has had no pauses of > 2 seconds and is symptomatically much improved with a net negative diuresis of 6.5 Liters and reduction in weight to 96 lbs today.  She will be discharged home today in good condition.  We have arranged for repeat 48 hr holter monitoring in order to assess her degree of rate control and the possibility of pauses with resumption of digoxin.  If she is noted to have either poor rate control of significant pauses, we will plan on EP consultation for further management of tachybrady syndrome.  Discharge Vitals Blood pressure 114/75, pulse 91, temperature 97.5 F (36.4 C), temperature source Oral, resp. rate 18, height 5\' 3"  (1.6 m), weight 96 lb 8 oz (43.772 kg), SpO2 98.00%.  Filed Weights   01/13/14 0210 01/14/14 0700 01/15/14 0250  Weight: 103 lb 13.4 oz (47.1 kg) 98 lb 9.6 oz (44.725 kg) 96 lb 8 oz (43.772 kg)   Labs  CBC Lab Results  Component Value Date   WBC 5.5 01/09/2014   HGB 12.1 01/09/2014   HCT 35.9* 01/09/2014   MCV 97.8 01/09/2014   PLT 188 04/04/625    Basic Metabolic Panel  Recent Labs  01/14/14 0625 01/15/14 0622  NA 139 139  K 3.7 3.7  CL 101 98  CO2 25 28  GLUCOSE 84 88  BUN 13 13  CREATININE 0.89 0.89  CALCIUM 9.5 10.1   Liver Function Tests Lab Results  Component Value Date   ALT 10 01/09/2014   AST 29 01/09/2014   ALKPHOS 65 01/09/2014   BILITOT 0.7 01/09/2014    Troponin Lab Results  Component Value Date   TROPONINI <0.30 01/10/2014    Disposition  Pt is being discharged home today in good condition.  Follow-up Plans & Appointments  Follow-up Information   Follow up with [place] and RN)    Contact information:   [address] [place]  [address] [phone]       Follow up with [facility] On 01/19/2014. (2:00 PM)    Contact information:   [address] [place] [address]       Follow up with [name], PA-C On 01/25/2014. (12:10 PM)    Specialty:  Physician Assistant   Contact information:   [address] [place] [address] [phone]       Follow up with [facility]. (We will contact you to arrange pick up of holter monitor.)    Contact information:   [address] [place] [address] [phone]      Follow up with Laurey Morale, MD. (as scheduled.)    Specialty:  Family Medicine   Contact information:   [place] [address] [phone]      Discharge Medications    Medication List    STOP taking these medications       losartan 100 MG tablet  Commonly known as:  COZAAR      TAKE these medications       allopurinol 300 MG tablet  Commonly known as:  ZYLOPRIM  Take 1 tablet (300 mg total) by mouth daily.     digoxin 0.125 MG tablet  Commonly known as:  LANOXIN  Take 1 tablet (0.125 mg total) by mouth daily.     diltiazem 240 MG 24 hr capsule  Commonly known as:  CARDIZEM CD  Take 1 capsule (240 mg total) by mouth daily.     furosemide 20 MG tablet  Commonly known as:  LASIX  Take 1 tablet (20 mg total) by mouth daily.     ibandronate 150 MG tablet  Commonly known as:  BONIVA  Take 1 tablet (150 mg total) by mouth every 30 (thirty) days. Take in the morning with a full glass of water, on an empty stomach, and do not take anything else by mouth or lie down for the next 30 min.     levothyroxine 50 MCG tablet  Commonly known as:  SYNTHROID, LEVOTHROID  Take 1 tablet (50 mcg total) by mouth daily.     metoprolol succinate 25 MG 24 hr tablet  Commonly known as:  TOPROL-XL  Take 1 tablet (25 mg total) by mouth every evening.     oxyCODONE-acetaminophen 5-325 MG per tablet  Commonly  known as:  PERCOCET/ROXICET  Take 0.5 tablets by mouth at bedtime as needed for moderate pain or severe pain.     potassium chloride 10 MEQ tablet  Commonly known as:  K-DUR,KLOR-CON  Take 1 tablet (10 mEq total) by mouth 2 (two) times daily.     vitamin B-6 500 MG tablet  Take 500 mg by mouth daily.     Vitamin D3 2000 UNITS Tabs  Take 2,000 Units by mouth daily.  warfarin 5 MG tablet  Commonly known as:  COUMADIN  Take 2.5-5 mg by mouth every evening. Take 1/2 tablet daily except for Mon & Wed take 1 tablet       Outstanding Labs/Studies  INR f/u in coumadin clinic as planned.  Duration of Discharge Encounter   Greater than 30 minutes including physician time.  Signed, Rogelia Mire NP 01/15/2014, 9:53 AM  [name] 01/15/2014

## 2014-01-15 NOTE — Discharge Instructions (Signed)
***  PLEASE REMEMBER TO BRING ALL OF YOUR MEDICATIONS TO EACH OF YOUR FOLLOW-UP OFFICE VISITS.  

## 2014-01-15 NOTE — Progress Notes (Signed)
Patient Name: [name] Date of Encounter: 01/15/2014   Principal Problem:   Acute on chronic diastolic heart failure Active Problems:   HYPERTENSION   Atrial fibrillation   HYPOTHYROIDISM   HYPERLIPIDEMIA   OSTEOARTHRITIS   SUBJECTIVE  Breathing much improved.  Says she's now ambulating w/o dyspnea.  -1/6 L yesterday, -6.5 L since admission.  Wt down to 96 lbs (admission wt of 108).  She thinks her dry weight is somewhere around 105.  HR's have been predominantly in the 90's.  Multiple sub-2 second pauses on tele with one 2 second pause yesterday afternoon - asymptomatic.    CURRENT MEDS . allopurinol  300 mg Oral Daily  . digoxin  0.125 mg Oral Daily  . diltiazem  240 mg Oral Daily  . furosemide  40 mg Intravenous Q8H  . furosemide  40 mg Oral BID  . levothyroxine  50 mcg Oral QAC breakfast  . metoprolol succinate  25 mg Oral QPM  . polyethylene glycol  17 g Oral Daily  . potassium chloride  20 mEq Oral BID  . sodium chloride  3 mL Intravenous Q12H  . Warfarin - Pharmacist Dosing Inpatient   Does not apply q1800   OBJECTIVE  Filed Vitals:   01/14/14 1400 01/14/14 2129 01/15/14 0250 01/15/14 0300  BP: 101/72 118/85  114/75  Pulse: 59 57  91  Temp: 98 F (36.7 C) 98.2 F (36.8 C)  97.5 F (36.4 C)  TempSrc: Oral Oral  Oral  Resp: 16 18    Height:      Weight:   96 lb 8 oz (43.772 kg)   SpO2: 98% 99%  98%    Intake/Output Summary (Last 24 hours) at 01/15/14 0637 Last data filed at 01/15/14 0200  Gross per 24 hour  Intake    170 ml  Output   1825 ml  Net  -1655 ml   Filed Weights   01/13/14 0210 01/14/14 0700 01/15/14 0250  Weight: 103 lb 13.4 oz (47.1 kg) 98 lb 9.6 oz (44.725 kg) 96 lb 8 oz (43.772 kg)   PHYSICAL EXAM  General: Pleasant, NAD. Neuro: Alert and oriented X 3. Moves all extremities spontaneously. Psych: Normal affect. HEENT:  Normal  Neck: Supple without bruits.  Neck veins are flat. Lungs:  Resp regular and unlabored, faint  bibasilar crackles. Heart: IR, IR, no s3, s4, or murmurs. Abdomen: Soft, non-tender, non-distended, BS + x 4.  Extremities: No clubbing, cyanosis or edema. DP/PT/Radials 1+ and equal bilaterally.  Accessory Clinical Findings  Basic Metabolic Panel  Recent Labs  01/13/14 0541 01/14/14 0625  NA 137 139  K 3.6* 3.7  CL 95* 101  CO2 25 25  GLUCOSE 83 84  BUN 14 13  CREATININE 0.87 0.89  CALCIUM 9.5 9.5   Lab Results  Component Value Date   INR 2.23* 01/14/2014   INR 2.12* 01/13/2014   INR 2.11* 01/12/2014   PROTIME 18.4 03/19/2009   TELE  Afib, 90's, pvc's, multiple sub-2 second pauses with one 2 second pause yesterday afternoon.  Radiology/Studies  Dg Chest 1 View  01/14/2014   CLINICAL DATA:  Congestive heart failure. History of atrial fibrillation and hypertension.  EXAM: CHEST - 1 VIEW  COMPARISON:  DG CHEST 2 VIEW dated 01/10/2014; DG CHEST 2 VIEW dated 06/10/2007  FINDINGS: 1501 hr. There is lordotic positioning and mild patient rotation to the left. Cardiomegaly and aortic atherosclerosis are stable. Oval radiodensity overlapping the AP window could be vascular or overlapping  the patient. There is stable mild scarring at the left lung base. No edema, confluent airspace opacity or pleural effusion is identified.  IMPRESSION: No acute findings or evidence congestive heart failure. Cardiomegaly with indeterminate oval density overlapping the AP window, possibly vascular.   Electronically Signed   By: Camie Patience M.D.   On: 01/14/2014 16:50   ASSESSMENT AND PLAN  1. Acute on chronic diastolic chf: Feeling much better.  -1/6 L yesterday, -6.5 L since admission.  Wt down to 96 lbs (admission wt of 108).  BP well-controlled, HR better (90's).  She is currently on lasix 40mg  bid.  She was only on 20mg  daily prior to admission, thus she may only require 40mg  daily now that HR is improved.  2. Permanent Afib: Rate up in setting of #1. Now on dilt 240 in the AM, toprol 25 in the PM, and  digoxin 0.125mg  daily.  Digoxin previously discontinued on 10/10/2013 in the setting of fatigue and >2 second pauses noted in clinic and subsequently a holter showing several </= 3 second pauses (she was also on atenolol 25 bid @ the time and her regimen was switched to toprol xl 75mg  daily).  If we are going to continue digoxin, she would benefit from repeat holter monitoring to assess for further bradycardia and she may require EP eval and PPM placement for tachybrady.  INR Rx.  3. HTN: Stable.  Signed, Rogelia Mire NP   The patient was seen, examined and discussed with Carmela Rima, NP and agree as above.  The patient is ready to be discharged home, HR controlled, continue the same regimen with Digoxin, Cardizem and metoprolol. She is rather dry, we will restart Lasix 20 mg po daily tomorrow.  Follow up with [name] with repeat Holter to monitor HR and possible pauses (h.o in January).  [name] 01/15/2014

## 2014-01-17 ENCOUNTER — Other Ambulatory Visit (HOSPITAL_COMMUNITY): Payer: Self-pay | Admitting: Cardiology

## 2014-01-17 ENCOUNTER — Encounter (INDEPENDENT_AMBULATORY_CARE_PROVIDER_SITE_OTHER): Payer: Medicare Other

## 2014-01-17 ENCOUNTER — Encounter: Payer: Self-pay | Admitting: *Deleted

## 2014-01-17 DIAGNOSIS — I6529 Occlusion and stenosis of unspecified carotid artery: Secondary | ICD-10-CM

## 2014-01-17 DIAGNOSIS — I4891 Unspecified atrial fibrillation: Secondary | ICD-10-CM

## 2014-01-17 NOTE — Progress Notes (Signed)
Patient ID: [name], female   DOB: 1925/12/19, 80 y.o.   MRN: [id number] EVO 48 hour holter monitor applied to patient.

## 2014-01-19 ENCOUNTER — Encounter: Payer: Self-pay | Admitting: Cardiology

## 2014-01-19 ENCOUNTER — Ambulatory Visit (INDEPENDENT_AMBULATORY_CARE_PROVIDER_SITE_OTHER): Payer: Medicare Other | Admitting: *Deleted

## 2014-01-19 ENCOUNTER — Ambulatory Visit (HOSPITAL_COMMUNITY): Payer: Medicare Other | Attending: Cardiology | Admitting: *Deleted

## 2014-01-19 DIAGNOSIS — I4891 Unspecified atrial fibrillation: Secondary | ICD-10-CM

## 2014-01-19 DIAGNOSIS — I6529 Occlusion and stenosis of unspecified carotid artery: Secondary | ICD-10-CM | POA: Insufficient documentation

## 2014-01-19 DIAGNOSIS — Z5181 Encounter for therapeutic drug level monitoring: Secondary | ICD-10-CM

## 2014-01-19 LAB — POCT INR: INR: 2.4

## 2014-01-19 NOTE — Progress Notes (Signed)
Carotid duplex complete 

## 2014-01-25 ENCOUNTER — Encounter: Payer: Self-pay | Admitting: Physician Assistant

## 2014-01-25 ENCOUNTER — Ambulatory Visit (INDEPENDENT_AMBULATORY_CARE_PROVIDER_SITE_OTHER): Payer: Medicare Other | Admitting: Physician Assistant

## 2014-01-25 VITALS — BP 140/76 | HR 81 | Ht 63.0 in | Wt 94.0 lb

## 2014-01-25 DIAGNOSIS — I4891 Unspecified atrial fibrillation: Secondary | ICD-10-CM

## 2014-01-25 DIAGNOSIS — R4189 Other symptoms and signs involving cognitive functions and awareness: Secondary | ICD-10-CM

## 2014-01-25 DIAGNOSIS — I10 Essential (primary) hypertension: Secondary | ICD-10-CM

## 2014-01-25 DIAGNOSIS — I6529 Occlusion and stenosis of unspecified carotid artery: Secondary | ICD-10-CM

## 2014-01-25 DIAGNOSIS — E785 Hyperlipidemia, unspecified: Secondary | ICD-10-CM

## 2014-01-25 DIAGNOSIS — I5032 Chronic diastolic (congestive) heart failure: Secondary | ICD-10-CM

## 2014-01-25 DIAGNOSIS — Z8673 Personal history of transient ischemic attack (TIA), and cerebral infarction without residual deficits: Secondary | ICD-10-CM

## 2014-01-25 DIAGNOSIS — F09 Unspecified mental disorder due to known physiological condition: Secondary | ICD-10-CM

## 2014-01-25 LAB — BASIC METABOLIC PANEL
BUN: 17 mg/dL (ref 6–23)
CALCIUM: 9.8 mg/dL (ref 8.4–10.5)
CO2: 31 mEq/L (ref 19–32)
Chloride: 99 mEq/L (ref 96–112)
Creatinine, Ser: 0.9 mg/dL (ref 0.4–1.2)
GFR: 64.8 mL/min (ref >60.00)
GLUCOSE: 88 mg/dL (ref 70–99)
POTASSIUM: 4 meq/L (ref 3.5–5.1)
Sodium: 136 mEq/L (ref 135–145)

## 2014-01-25 NOTE — Patient Instructions (Signed)
LAB WORK TODAY; BMET  YOU HAVE A COUMADIN APPT ON 02/02/14 ON THAT DAY WE WILL GET A DIGOXIN LEVEL AS WELL; MAKE SURE NOT TO TAKE YOUR DIGOIXN UNTIL AFTER LAB WORK HAS BEEN.  YOU WILL NEED A HEAD CT W/O CONTRAST DX HX CVA, A-FIB, ON 01/29/14 @ 1 PM  PLEASE FOLLOW UP WITH SCOTT WEAVER, PAC ON 02/21/14 @ 2 PM SAME DAY DR. Aundra Dubin IS IN THE OFFICE

## 2014-01-25 NOTE — Progress Notes (Signed)
[place], [place], [place]  [address] Phone: [phone] Fax:  [phone]  Date:  01/25/2014   ID:  [name], DOB Oct 09, 1925, MRN [id number]  PCP:  Laurey Morale, MD  Cardiologist:  Dr. [name]     History of Present Illness: [name] is a 80 y.o. female with a hx of chronic atrial fibrillation and chronic diastolic CHF.  Her right leg has been weak since her 2008 CVA (mildly) so she walks with a cane. She has had pauses noted on her ECG and Dr. Aundra Dubin had her stop digoxin and atenolol and start on a lower dose of Toprol XL. A holter after the medication changes showed average HR 79 with several pauses that were </= 3 seconds. She denies lightheadedness or syncope.    She was admitted from the office recently 4/7-4/13 with acute on chronic diastolic CHF in the setting of atrial fibrillation with RVR.  She was placed on IV diltiazem and diuresed with IV Lasix. She had difficult to control heart rates and was placed back on low-dose digoxin.  She was to have an outpatient Holter monitor to assess for significant bradyarrhythmias or uncontrolled heart rate. She would possibly need follow up with EP depending upon the results.  She returns for f/u.  She is feeling better. Her breathing is improved. She denies chest pain or syncope. She denies near syncope. She denies orthopnea, PND or edema. Her appetite is poor. She is not eating very well. She does note some difficulty with memory and cognition since discharge from the hospital. She denies any speech impairment, unilateral weakness or transient blindness   Recent Labs: 01/03/2014: TSH 5.28  01/09/2014: ALT 10; Hemoglobin 12.1  01/14/2014: Pro B Natriuretic peptide (BNP) 5692.0*  01/15/2014: Creatinine 0.89; Potassium 3.7   Wt Readings from Last 3 Encounters:  01/25/14 94 lb (42.638 kg)  01/15/14 96 lb 8 oz (43.772 kg)  01/09/14 101 lb (45.813 kg)     Past Medical History  Diagnosis Date  . Atrial  fibrillation     a. permanent;  b. chronic coumadin;  c. 10/2013 digoxin and atenolol d/c'd 2/2 pauses of <3 sec;  d. 01/2014 digoxin resumed @ 0.125 mg in setting of difficult to control rate and diast chf.  . Hypertension     hx of edema with Amlodipine  . Hyperlipidemia   . Edema leg   . Contact dermatitis and other eczema, due to unspecified cause   . Frequent UTI     Dr. Terance Hart sees pt  . Urticaria, unspecified   . Lumbago     chronic low back pain  . Gout, unspecified   . Hypothyroidism   . H/O: CVA (cardiovascular accident) 2008  . DJD (degenerative joint disease)   . Diverticulosis of colon (without mention of hemorrhage)   . Osteoporosis     las DEXA on 01/30/10  . History of melanoma     on right ear  . Skin cancer     forehead  . Blood clotting disorder   . Chronic diastolic heart failure     a. Echo (6/08):  EF 55-60%, mild MR;  b. Echo (10/2013):  EF 55%, mild AS  . History of CVA (cerebrovascular accident)     2008  . Carotid stenosis     a. Carotid US (4/14):  RICA 40-59%  . Bradycardia     a. Holter (1/15):  avg HR 78, 1.4% PVCs, several pauses </= 3 sec,   .  Aortic stenosis     mild on echo 10/2013    Current Outpatient Prescriptions  Medication Sig Dispense Refill  . allopurinol (ZYLOPRIM) 300 MG tablet Take 1 tablet (300 mg total) by mouth daily.  90 tablet  3  . Cholecalciferol (VITAMIN D3) 2000 UNITS TABS Take 2,000 Units by mouth daily.       . digoxin (LANOXIN) 0.125 MG tablet Take 1 tablet (0.125 mg total) by mouth daily.  30 tablet  6  . diltiazem (CARDIZEM CD) 240 MG 24 hr capsule Take 1 capsule (240 mg total) by mouth daily.  30 capsule  6  . furosemide (LASIX) 20 MG tablet Take 1 tablet (20 mg total) by mouth daily.  90 tablet  3  . ibandronate (BONIVA) 150 MG tablet Take 1 tablet (150 mg total) by mouth every 30 (thirty) days. Take in the morning with a full glass of water, on an empty stomach, and do not take anything else by mouth or lie down for  the next 30 min.  3 tablet  3  . levothyroxine (SYNTHROID, LEVOTHROID) 50 MCG tablet Take 1 tablet (50 mcg total) by mouth daily.  90 tablet  3  . metoprolol succinate (TOPROL-XL) 25 MG 24 hr tablet Take 1 tablet (25 mg total) by mouth every evening.  30 tablet  6  . oxyCODONE-acetaminophen (PERCOCET/ROXICET) 5-325 MG per tablet Take 0.5 tablets by mouth at bedtime as needed for moderate pain or severe pain.      . potassium chloride (K-DUR,KLOR-CON) 10 MEQ tablet Take 1 tablet (10 mEq total) by mouth 2 (two) times daily.  30 tablet  6  . Pyridoxine HCl (VITAMIN B-6) 500 MG tablet Take 500 mg by mouth daily.        [name] warfarin (COUMADIN) 5 MG tablet Take 2.5-5 mg by mouth every evening. Take 1/2 tablet daily except for Mon & Wed take 1 tablet       No current facility-administered medications for this visit.    Allergies:   Amlodipine; Amoxicillin; Hydrocodone-acetaminophen; Nitrofurantoin; and Sulfonamide derivatives   Social History:  The patient  reports that she quit smoking about 19 years ago. She has never used smokeless tobacco. She reports that she does not drink alcohol or use illicit drugs.   Family History:  The patient's family history includes Anemia in her sister and another family member; COPD in an other family member; Colon polyps in her mother; Coronary artery disease in an other family member; Immunodeficiency in an other family member. There is no history of Colon cancer.   ROS:  Please see the history of present illness.     All other systems reviewed and negative.    PHYSICAL EXAM: VS:  BP 140/76  Pulse 81  Ht 5\' 3"  (1.6 m)  Wt 94 lb (42.638 kg)  BMI 16.66 kg/m2 Well nourished, well developed, in no acute distress HEENT: normal Neck: + JVD Cardiac:  normal S1, S2; irregularly irregular rhythm; I cannot appreciate a murmur Lungs:  Decreased breath sounds bilaterally with bibasilar crackles Abd: soft, nontender, no hepatomegaly Ext: 1+ bilateral LE edema Skin: warm  and dry Neuro:  CNs 2-12 intact, no focal abnormalities noted  EKG:   Atrial fibrillation, HR 81, inferolateral ST changes-question digoxin effect, PVC    ASSESSMENT AND PLAN:  1. Chronic Diastolic CHF:  Volume stable. Continue current therapy. Check follow up basic metabolic panel today. 2. Atrial Fibrillation:  Rate is better controlled. Her monitor has been completed. Report is  not available at this time. I was able to pull some information and it appears that her minimum heart rate was 55 and maximum heart rate was 127. The most significant pause was 2.9 seconds. If the official report demonstrates evidence of tachybradycardia syndrome, we may refer her to EP. For now, she will continue on current dose of diltiazem, metoprolol and digoxin. She is tolerating Coumadin. INRs have been therapeutic 3. Hypertension:  Controlled.  4. Carotid Stenosis:  This is being managed conservatively. She stopped Lipitor due to leg pain.  She is not on ASA as she is on coumadin.  5. Hyperlipidemia:  Intol to Lipitor as noted above.  Consider  Pravastatin in follow up. 6. Cognitive Impairment:  She has had some difficulty since discharge from the hospital. For example, she has been unable to figure out how to work the remote control. She's had some word finding issues. Her INRs have been therapeutic. Obtain head CT. If this is unremarkable, no further workup.    7. Disposition:  Follow up with me at Dr. Aundra Dubin in 3-4 weeks.    Signed, [name], PA-C  01/25/2014 12:59 PM

## 2014-01-26 ENCOUNTER — Telehealth: Payer: Self-pay | Admitting: *Deleted

## 2014-01-26 NOTE — Telephone Encounter (Signed)
pt notified about lab results with verbal understanding  

## 2014-01-29 ENCOUNTER — Ambulatory Visit (INDEPENDENT_AMBULATORY_CARE_PROVIDER_SITE_OTHER)
Admission: RE | Admit: 2014-01-29 | Discharge: 2014-01-29 | Disposition: A | Discharge status: Home / Self Care | Payer: Medicare Other | Source: Ambulatory Visit | Attending: Physician Assistant | Admitting: Physician Assistant

## 2014-01-29 DIAGNOSIS — R4189 Other symptoms and signs involving cognitive functions and awareness: Secondary | ICD-10-CM

## 2014-01-29 DIAGNOSIS — I4891 Unspecified atrial fibrillation: Secondary | ICD-10-CM

## 2014-01-29 DIAGNOSIS — Z8673 Personal history of transient ischemic attack (TIA), and cerebral infarction without residual deficits: Secondary | ICD-10-CM

## 2014-01-29 DIAGNOSIS — F09 Unspecified mental disorder due to known physiological condition: Secondary | ICD-10-CM

## 2014-01-30 ENCOUNTER — Telehealth: Payer: Self-pay | Admitting: *Deleted

## 2014-01-30 NOTE — Telephone Encounter (Signed)
pt's daughter Marliss Czar notified about Head CT results with vebal understanding. She asked if we had anything yet on the monitor results and I said that I have seen yet but we will let them as soon as we get them, she said ok and thank you

## 2014-02-01 ENCOUNTER — Telehealth: Payer: Self-pay | Admitting: *Deleted

## 2014-02-01 NOTE — Telephone Encounter (Signed)
Dr Aundra Dubin reviewed monitor done 01/17/14 Pt is in atrial fibrillation persistently Frequent PVCs HR 51-127, average 88 Longest pause ~2.9 sec Observe for now with no changes.  Pt and daughter, [name] notified.

## 2014-02-02 ENCOUNTER — Other Ambulatory Visit: Payer: Medicare Other

## 2014-02-02 ENCOUNTER — Ambulatory Visit (INDEPENDENT_AMBULATORY_CARE_PROVIDER_SITE_OTHER): Payer: Medicare Other

## 2014-02-02 DIAGNOSIS — Z5181 Encounter for therapeutic drug level monitoring: Secondary | ICD-10-CM

## 2014-02-02 DIAGNOSIS — I4891 Unspecified atrial fibrillation: Secondary | ICD-10-CM

## 2014-02-02 LAB — POCT INR: INR: 1.8

## 2014-02-03 LAB — DIGOXIN LEVEL: Digoxin Level: 1.3 ng/mL (ref 0.8–2.0)

## 2014-02-05 ENCOUNTER — Other Ambulatory Visit: Payer: Self-pay | Admitting: *Deleted

## 2014-02-05 NOTE — Progress Notes (Signed)
Pt and daughter, Marliss Czar notified to decrease digoxin to every other day.

## 2014-02-12 ENCOUNTER — Other Ambulatory Visit: Payer: Self-pay | Admitting: Family Medicine

## 2014-02-13 ENCOUNTER — Telehealth: Payer: Self-pay | Admitting: Family Medicine

## 2014-02-13 NOTE — Telephone Encounter (Signed)
Pt needs new rx oxycodone °

## 2014-02-14 ENCOUNTER — Ambulatory Visit (INDEPENDENT_AMBULATORY_CARE_PROVIDER_SITE_OTHER): Payer: Medicare Other | Admitting: Surgery

## 2014-02-14 DIAGNOSIS — Z5181 Encounter for therapeutic drug level monitoring: Secondary | ICD-10-CM

## 2014-02-14 DIAGNOSIS — I4891 Unspecified atrial fibrillation: Secondary | ICD-10-CM

## 2014-02-14 LAB — POCT INR: INR: 1.7

## 2014-02-14 MED ORDER — OXYCODONE-ACETAMINOPHEN 5-325 MG PO TABS: 1.0000 | ORAL_TABLET | ORAL | Status: DC | PRN

## 2014-02-14 NOTE — Telephone Encounter (Signed)
Script is ready for pick up and I left a voice message.  

## 2014-02-14 NOTE — Telephone Encounter (Signed)
done

## 2014-02-20 ENCOUNTER — Other Ambulatory Visit: Payer: Self-pay | Admitting: Family Medicine

## 2014-02-21 ENCOUNTER — Ambulatory Visit (INDEPENDENT_AMBULATORY_CARE_PROVIDER_SITE_OTHER): Payer: Medicare Other | Admitting: Physician Assistant

## 2014-02-21 ENCOUNTER — Encounter: Payer: Self-pay | Admitting: Physician Assistant

## 2014-02-21 ENCOUNTER — Encounter (INDEPENDENT_AMBULATORY_CARE_PROVIDER_SITE_OTHER): Payer: Self-pay

## 2014-02-21 VITALS — BP 140/78 | HR 105 | Ht 63.0 in | Wt 95.0 lb

## 2014-02-21 DIAGNOSIS — I6529 Occlusion and stenosis of unspecified carotid artery: Secondary | ICD-10-CM

## 2014-02-21 DIAGNOSIS — I10 Essential (primary) hypertension: Secondary | ICD-10-CM

## 2014-02-21 DIAGNOSIS — I4891 Unspecified atrial fibrillation: Secondary | ICD-10-CM

## 2014-02-21 DIAGNOSIS — I5032 Chronic diastolic (congestive) heart failure: Secondary | ICD-10-CM

## 2014-02-21 DIAGNOSIS — E785 Hyperlipidemia, unspecified: Secondary | ICD-10-CM

## 2014-02-21 MED ORDER — DIGOXIN 125 MCG PO TABS: 0.0625 mg | ORAL_TABLET | Freq: Every day | ORAL | Status: DC

## 2014-02-21 MED ORDER — PRAVASTATIN SODIUM 10 MG PO TABS: ORAL_TABLET | ORAL | Status: DC

## 2014-02-21 NOTE — Patient Instructions (Signed)
Your physician recommends that you return for lab work in: [place] 3 MONTHS  YOU WILL NEED A DIGOXIN LEVEL AT YOUR NEXT COUMADIN APPT ON 03/05/14; MAKE SURE NOT TO TAKE YOUR DIGOXIN UNTIL AFTER YOUR LAB WORK IS DONE  Your physician recommends that you schedule a follow-up appointment in: 3 MONTHS WITH DR. Aundra Dubin  START PRAVASTATIN 10 MG ON MON, WED, AND FRI'S

## 2014-02-21 NOTE — Progress Notes (Signed)
Cardiology Office Note                                                                                             Date:  02/21/2014   ID:  Ebbie, Sorenson 07-17-1926, MRN [id number]  PCP:  Laurey Morale, MD  Cardiologist:  Dr. [name]     History of Present Illness: [name] is a 80 y.o. female with a hx of chronic atrial fibrillation and chronic diastolic CHF.  Her right leg has been weak since her 2008 CVA (mildly) so she walks with a cane. She has had pauses noted on her ECG and Dr. Aundra Dubin had her stop digoxin and atenolol and start on a lower dose of Toprol XL. A holter after the medication changes showed average HR 79 with several pauses that were </= 3 seconds. She denies lightheadedness or syncope.    She was admitted from the office 01/2352 with a/c diastolic CHF in the setting of atrial fibrillation with RVR.  She had difficult to control heart rates and was placed back on low-dose digoxin.  Outpatient Holter demonstrated Avg HR 88 and longest pause 2.9 sec.  No changes were made.    I saw her in f/u 4/23.  She noted problems with cognitive impairment and Head CT was done.  This was unremarkable.  Digoxin level was elevated 2 weeks ago and her Digoxin dose was reduced to QOD.  She returns for follow up.  She continues to feel weak but is getting stronger. She denies dyspnea, chest pain, syncope, orthopnea, PND or edema.   Recent Labs: 01/03/2014: TSH 5.28  01/09/2014: ALT 10; Hemoglobin 12.1  01/14/2014: Pro B Natriuretic peptide (BNP) 5692.0*  01/25/2014: Creatinine 0.9; Potassium 4.0   Head CT without Contrast (01/29/14): IMPRESSION: 1. No acute intracranial abnormality. 2. Remote right MCA territory infarct. 3. Generalized cerebral atrophy with advanced chronic microvascular ischemic disease involving the supratentorial white matter   Wt Readings from Last 3 Encounters:  02/21/14 95 lb (43.092 kg)  01/25/14 94 lb (42.638 kg)  01/15/14 96 lb 8 oz (43.772  kg)     Past Medical History  Diagnosis Date  . Atrial fibrillation     a. permanent;  b. chronic coumadin;  c. 10/2013 digoxin and atenolol d/c'd 2/2 pauses of <3 sec;  d. 01/2014 digoxin resumed @ 0.125 mg in setting of difficult to control rate and diast chf.  . Hypertension     hx of edema with Amlodipine  . Hyperlipidemia   . Edema leg   . Contact dermatitis and other eczema, due to unspecified cause   . Frequent UTI     Dr. Terance Hart sees pt  . Urticaria, unspecified   . Lumbago     chronic low back pain  . Gout, unspecified   . Hypothyroidism   . H/O: CVA (cardiovascular accident) 2008  . DJD (degenerative joint disease)   . Diverticulosis of colon (without mention of hemorrhage)   . Osteoporosis     las DEXA on 01/30/10  . History of melanoma     on right ear  . Skin  cancer     forehead  . Blood clotting disorder   . Chronic diastolic heart failure     a. Echo (6/08):  EF 55-60%, mild MR;  b. Echo (10/2013):  EF 55%, mild AS  . History of CVA (cerebrovascular accident)     2008  . Bradycardia     a. Holter (1/15):  avg HR 78, 1.4% PVCs, several pauses </= 3 sec, b.  Holter (01/2014):  AFib, freq PVCs, HR 51-127, Avg HR 88, longest pause 2.9 sec - no changes made  . Aortic stenosis     mild on echo 10/2013  . Carotid stenosis     a. Carotid US (4/14):  RICA 40-59%;  b. Carotid US (01/2014) bilateral 1-39%    Current Outpatient Prescriptions  Medication Sig Dispense Refill  . allopurinol (ZYLOPRIM) 300 MG tablet TAKE 1 TABLET BY MOUTH EVERY DAY  90 tablet  0  . Cholecalciferol (VITAMIN D3) 2000 UNITS TABS Take 2,000 Units by mouth daily.       . digoxin (LANOXIN) 0.125 MG tablet 1 tablet every other day      . diltiazem (CARDIZEM CD) 240 MG 24 hr capsule Take 1 capsule (240 mg total) by mouth daily.  30 capsule  6  . furosemide (LASIX) 20 MG tablet Take 1 tablet (20 mg total) by mouth daily.  90 tablet  3  . ibandronate (BONIVA) 150 MG tablet Take 1 tablet (150 mg total)  by mouth every 30 (thirty) days. Take in the morning with a full glass of water, on an empty stomach, and do not take anything else by mouth or lie down for the next 30 min.  3 tablet  3  . levothyroxine (SYNTHROID, LEVOTHROID) 50 MCG tablet TAKE 1 TABLET BY MOUTH EVERY DAY  90 tablet  3  . metoprolol succinate (TOPROL-XL) 25 MG 24 hr tablet Take 1 tablet (25 mg total) by mouth every evening.  30 tablet  6  . oxyCODONE-acetaminophen (PERCOCET/ROXICET) 5-325 MG per tablet Take 1 tablet by mouth every 4 (four) hours as needed for moderate pain or severe pain.  240 tablet  0  . potassium chloride (K-DUR,KLOR-CON) 10 MEQ tablet Take 1 tablet (10 mEq total) by mouth 2 (two) times daily.  30 tablet  6  . Pyridoxine HCl (VITAMIN B-6) 500 MG tablet Take 500 mg by mouth daily.        [name] warfarin (COUMADIN) 5 MG tablet Take 2.5-5 mg by mouth every evening. Take 1/2 tablet daily Wed & take 1 tablet all other days       No current facility-administered medications for this visit.    Allergies:   Amlodipine; Amoxicillin; Hydrocodone-acetaminophen; Nitrofurantoin; and Sulfonamide derivatives   Social History:  The patient  reports that she quit smoking about 19 years ago. She has never used smokeless tobacco. She reports that she does not drink alcohol or use illicit drugs.   Family History:  The patient's family history includes Anemia in her sister and another family member; COPD in an other family member; Colon polyps in her mother; Coronary artery disease in an other family member; Immunodeficiency in an other family member. There is no history of Colon cancer.   ROS:  Please see the history of present illness.     All other systems reviewed and negative.    PHYSICAL EXAM: VS:  BP 140/78  Pulse 105  Ht 5\' 3"  (1.6 m)  Wt 95 lb (43.092 kg)  BMI 16.83 kg/m2 Well  nourished, well developed, in no acute distress HEENT: normal Neck:  JVD Cardiac:  normal S1, S2;  irregularly irregular rhythm; I cannot  appreciate a murmur Lungs:  Decreased breath sounds bilaterally with bibasilar crackles Abd: soft, nontender, no hepatomegaly Ext: no edema Skin: warm and dry Neuro:  CNs 2-12 intact, no focal abnormalities noted  EKG:   Atrial fibrillation, HR 105, inferolateral ST changes-question digoxin effect  ASSESSMENT AND PLAN:  1. Chronic Diastolic CHF:  Volume stable. Continue current therapy. 2. Atrial Fibrillation:  Heart rate somewhat elevated. Change digoxin to 0.0625 mg daily. Repeat digoxin level in several weeks. 3. Hypertension:  Controlled.  4. Carotid Stenosis:  This is being managed conservatively. She stopped Lipitor due to leg pain.  She is not on ASA as she is on coumadin.  5. Hyperlipidemia:  Intol to Lipitor as noted above.  She is agreeable to try Pravastatin. Start pravastatin 10 mg each bedtime on Monday, Wednesday, Friday only. Check lipids and LFTs in 3 months. 6. Disposition:  Follow up with Dr. Aundra Dubin in 3 months.     Signed, Versie Starks, MHS 02/21/2014 2:52 PM    [facility] [address], [place], [place]  [address] Phone: [phone]; Fax: [phone]

## 2014-03-02 ENCOUNTER — Other Ambulatory Visit: Payer: Self-pay | Admitting: Family Medicine

## 2014-03-05 ENCOUNTER — Other Ambulatory Visit: Payer: Medicare Other

## 2014-03-05 ENCOUNTER — Ambulatory Visit (INDEPENDENT_AMBULATORY_CARE_PROVIDER_SITE_OTHER): Payer: Medicare Other | Admitting: *Deleted

## 2014-03-05 DIAGNOSIS — I4891 Unspecified atrial fibrillation: Secondary | ICD-10-CM

## 2014-03-05 DIAGNOSIS — Z5181 Encounter for therapeutic drug level monitoring: Secondary | ICD-10-CM

## 2014-03-05 LAB — POCT INR: INR: 2.6

## 2014-03-06 ENCOUNTER — Telehealth: Payer: Self-pay | Admitting: *Deleted

## 2014-03-06 LAB — DIGOXIN LEVEL: Digoxin Level: 0.7 ng/mL — ABNORMAL LOW (ref 0.8–2.0)

## 2014-03-06 NOTE — Telephone Encounter (Signed)
lmom digoxin level ok per Brynda Rim. PA, any questions cb [phone]

## 2014-03-13 ENCOUNTER — Other Ambulatory Visit: Payer: Self-pay | Admitting: *Deleted

## 2014-03-13 MED ORDER — DILTIAZEM HCL ER COATED BEADS 240 MG PO CP24: 240.0000 mg | ORAL_CAPSULE | Freq: Every day | ORAL | Status: DC

## 2014-03-13 MED ORDER — METOPROLOL SUCCINATE ER 25 MG PO TB24: 25.0000 mg | ORAL_TABLET | Freq: Every evening | ORAL | Status: DC

## 2014-03-16 ENCOUNTER — Ambulatory Visit (INDEPENDENT_AMBULATORY_CARE_PROVIDER_SITE_OTHER): Payer: Medicare Other

## 2014-03-16 ENCOUNTER — Telehealth: Payer: Self-pay

## 2014-03-16 DIAGNOSIS — Z5181 Encounter for therapeutic drug level monitoring: Secondary | ICD-10-CM

## 2014-03-16 DIAGNOSIS — I4891 Unspecified atrial fibrillation: Secondary | ICD-10-CM

## 2014-03-16 LAB — POCT INR: INR: 2.7

## 2014-03-16 NOTE — Telephone Encounter (Signed)
Pt is requesting all of her cardiac medication prescriptions and Klor-con be sent to the pharmacy [facility], 90 day supply please.  Thanks

## 2014-03-19 ENCOUNTER — Other Ambulatory Visit: Payer: Self-pay

## 2014-03-19 DIAGNOSIS — I4891 Unspecified atrial fibrillation: Secondary | ICD-10-CM

## 2014-03-19 MED ORDER — DIGOXIN 125 MCG PO TABS: 0.0625 mg | ORAL_TABLET | Freq: Every day | ORAL | Status: DC

## 2014-04-10 ENCOUNTER — Ambulatory Visit (INDEPENDENT_AMBULATORY_CARE_PROVIDER_SITE_OTHER): Payer: Medicare Other | Admitting: *Deleted

## 2014-04-10 DIAGNOSIS — I4891 Unspecified atrial fibrillation: Secondary | ICD-10-CM

## 2014-04-10 DIAGNOSIS — Z5181 Encounter for therapeutic drug level monitoring: Secondary | ICD-10-CM

## 2014-04-10 LAB — POCT INR: INR: 2.5

## 2014-04-19 ENCOUNTER — Telehealth: Payer: Self-pay | Admitting: Cardiology

## 2014-04-19 ENCOUNTER — Other Ambulatory Visit: Payer: Self-pay

## 2014-04-19 DIAGNOSIS — E785 Hyperlipidemia, unspecified: Secondary | ICD-10-CM

## 2014-04-19 MED ORDER — ALLOPURINOL 300 MG PO TABS: ORAL_TABLET | ORAL | Status: DC

## 2014-04-19 MED ORDER — DILTIAZEM HCL ER COATED BEADS 240 MG PO CP24: 240.0000 mg | ORAL_CAPSULE | Freq: Every day | ORAL | Status: DC

## 2014-04-19 MED ORDER — PRAVASTATIN SODIUM 10 MG PO TABS: ORAL_TABLET | ORAL | Status: DC

## 2014-04-19 MED ORDER — FUROSEMIDE 20 MG PO TABS: ORAL_TABLET | ORAL | Status: DC

## 2014-04-19 MED ORDER — METOPROLOL SUCCINATE ER 25 MG PO TB24: 25.0000 mg | ORAL_TABLET | Freq: Every evening | ORAL | Status: DC

## 2014-04-19 MED ORDER — DIGOXIN 125 MCG PO TABS: 0.0625 mg | ORAL_TABLET | Freq: Every day | ORAL | Status: DC

## 2014-04-19 MED ORDER — POTASSIUM CHLORIDE CRYS ER 10 MEQ PO TBCR: 10.0000 meq | EXTENDED_RELEASE_TABLET | Freq: Two times a day (BID) | ORAL | Status: DC

## 2014-04-19 NOTE — Telephone Encounter (Signed)
New message    Patient calling need to discuss cutting back on lasix.

## 2014-04-19 NOTE — Telephone Encounter (Signed)
I spoke with the pt and she complains of increased frequency of urination for 1 week.  The pt said she is urinating every hour during the day and every 2-3 hours at night.  The pt's weight remains stable and she is not having any CHF symptoms. The pt denies painful/burning with urination, foul smell, lower abdominal or pelvic pain, fever and chills. I advised the pt that she needs to rule out a UTI before making changes in her medication.  The pt does have a history of UTIs. The pt will contact her Urologist or PCP to determine who can see her first for evaluation.

## 2014-05-08 ENCOUNTER — Ambulatory Visit (INDEPENDENT_AMBULATORY_CARE_PROVIDER_SITE_OTHER): Payer: Medicare Other | Admitting: Pharmacist Clinician (PhC)/ Clinical Pharmacy Specialist

## 2014-05-08 DIAGNOSIS — I4891 Unspecified atrial fibrillation: Secondary | ICD-10-CM

## 2014-05-08 DIAGNOSIS — Z5181 Encounter for therapeutic drug level monitoring: Secondary | ICD-10-CM

## 2014-05-08 LAB — POCT INR: INR: 2.2

## 2014-05-23 ENCOUNTER — Other Ambulatory Visit: Payer: Self-pay | Admitting: Family Medicine

## 2014-05-23 ENCOUNTER — Ambulatory Visit (INDEPENDENT_AMBULATORY_CARE_PROVIDER_SITE_OTHER): Payer: Medicare Other | Admitting: Cardiology

## 2014-05-23 ENCOUNTER — Encounter: Payer: Self-pay | Admitting: Cardiology

## 2014-05-23 ENCOUNTER — Other Ambulatory Visit: Payer: Medicare Other

## 2014-05-23 VITALS — BP 128/80 | HR 102 | Ht 63.0 in | Wt 99.0 lb

## 2014-05-23 DIAGNOSIS — I5033 Acute on chronic diastolic (congestive) heart failure: Secondary | ICD-10-CM

## 2014-05-23 DIAGNOSIS — I4819 Other persistent atrial fibrillation: Secondary | ICD-10-CM

## 2014-05-23 DIAGNOSIS — I4891 Unspecified atrial fibrillation: Secondary | ICD-10-CM

## 2014-05-23 DIAGNOSIS — I10 Essential (primary) hypertension: Secondary | ICD-10-CM

## 2014-05-23 DIAGNOSIS — R350 Frequency of micturition: Secondary | ICD-10-CM

## 2014-05-23 DIAGNOSIS — E785 Hyperlipidemia, unspecified: Secondary | ICD-10-CM

## 2014-05-23 LAB — URINALYSIS, ROUTINE W REFLEX MICROSCOPIC
Bilirubin Urine: NEGATIVE
Ketones, ur: NEGATIVE
Nitrite: NEGATIVE
Specific Gravity, Urine: <=1.005 — AB (ref 1.000–1.030)
Total Protein, Urine: NEGATIVE
URINE GLUCOSE: NEGATIVE
UROBILINOGEN UA: 0.2 (ref 0.0–1.0)
pH: 7 (ref 5.0–8.0)

## 2014-05-23 LAB — BASIC METABOLIC PANEL
BUN: 13 mg/dL (ref 6–23)
CALCIUM: 9.5 mg/dL (ref 8.4–10.5)
CO2: 27 mEq/L (ref 19–32)
Chloride: 102 mEq/L (ref 96–112)
Creatinine, Ser: 0.8 mg/dL (ref 0.4–1.2)
GFR: 71.25 mL/min (ref >60.00)
Glucose, Bld: 79 mg/dL (ref 70–99)
Potassium: 4 mEq/L (ref 3.5–5.1)
Sodium: 137 mEq/L (ref 135–145)

## 2014-05-23 LAB — BRAIN NATRIURETIC PEPTIDE: PRO B NATRI PEPTIDE: 326 pg/mL — AB (ref 0.0–100.0)

## 2014-05-23 NOTE — Progress Notes (Signed)
Patient ID: [name], female   DOB: 1926-02-05, 80 y.o.   MRN: [id number] PCP: Dr. Sarajane Jews  80 yo with history of chronic atrial fibrillation but also history of pauses and chronic diastolic CHF presents for cardiology followup.  She was admitted to the hospital in 4/15 with atrial fibrillation/RVR and acute on chronic diastolic CHF.  She was diuresed and digoxin was restarted for rate control.  Holter done after restarting digoxin showed average HR 88 (atrial fibrillation) with occasional pauses, longest was 2.9 seconds.   Currently, she is doing well.  No chest pain. No lightheadedness, syncope, or falls.  She is not short of breath walking on flat ground and can climb a flight of steps without problems.  She is having some memory difficulty.  She also has been having problems for several weeks with urinary urgency/frequency.  She was treated for a UTI recently but still has these symptoms.  She has seen a urologist.    ECG: Atrial fibrillation with slight inferior ST depression  Labs (3/14): K 3.9, creatinine 0.8, LDL 78, HDL 31 Labs (1/15): K 3.7, creatinine 0.8 Labs (4/15): K 4, creatinine 0.9 Labs (6/15): digoxin 0.7  PMH: 1. Chronic atrial fibrillation 2. Gout 3. Diastolic CHF: Echo (8/46) with EF 55-60%, mild MR.  Echo (1/15) with EF 55%, probably at least mild AS.  4. HTN: Edema with amlodipine . 5. Hyperlipidemia: myalgias with atorvastatin 6. H/o UTIs  7. Chronic low back pain.  8. Hypothyroidism 9. CVA in 2008.  10.  OA 11. Diverticulosis 12. Osteoporosis 13. H/o melanoma 14. Carotid stenosis: Dopplers (4/14) with 40-59% RICA stenosis (has been stable).  15. Bradycardia: Holter (1/15) with average HR 78, 1.4% PVCs, several pauses </= 3 seconds.  Holter (4/15) with atrial fibrillation, average HR 88, frequent PVCs, longest pause 2.9 sec 16. Aortic stenosis: At least mild on 1/15 echo.   SH: Widow, 3 children, lives in [place], prior smoker quit '96.   FH: Sister with  CAD  Current Outpatient Prescriptions  Medication Sig Dispense Refill  . allopurinol (ZYLOPRIM) 300 MG tablet TAKE 1 TABLET BY MOUTH EVERY DAY  90 tablet  1  . Cholecalciferol (VITAMIN D3) 2000 UNITS TABS Take 2,000 Units by mouth daily.       . digoxin (LANOXIN) 0.125 MG tablet Take 0.5 tablets (0.0625 mg total) by mouth daily.  45 tablet  1  . diltiazem (CARDIZEM CD) 240 MG 24 hr capsule Take 1 capsule (240 mg total) by mouth daily.  90 capsule  1  . furosemide (LASIX) 20 MG tablet TAKE 1 TABLET BY MOUTH EVERY DAY  90 tablet  1  . ibandronate (BONIVA) 150 MG tablet Take 1 tablet (150 mg total) by mouth every 30 (thirty) days. Take in the morning with a full glass of water, on an empty stomach, and do not take anything else by mouth or lie down for the next 30 min.  3 tablet  3  . levothyroxine (SYNTHROID, LEVOTHROID) 50 MCG tablet TAKE 1 TABLET BY MOUTH EVERY DAY  90 tablet  3  . metoprolol succinate (TOPROL-XL) 25 MG 24 hr tablet Take 1 tablet (25 mg total) by mouth every evening.  90 tablet  1  . oxyCODONE-acetaminophen (PERCOCET/ROXICET) 5-325 MG per tablet Take 1 tablet by mouth every 4 (four) hours as needed for moderate pain or severe pain.  240 tablet  0  . potassium chloride (K-DUR,KLOR-CON) 10 MEQ tablet Take 1 tablet (10 mEq total) by mouth 2 (two)  times daily.  180 tablet  1  . pravastatin (PRAVACHOL) 10 MG tablet TAKE 10 MG ON MON, WED  AND FRI'S  45 tablet  1  . Pyridoxine HCl (VITAMIN B-6) 500 MG tablet Take 500 mg by mouth daily.        [name] warfarin (COUMADIN) 5 MG tablet Take 2.5-5 mg by mouth every evening. Take 1/2 tablet daily Wed & take 1 tablet all other days       No current facility-administered medications for this visit.    BP 128/80  Pulse 102  Ht 5\' 3"  (1.6 m)  Wt 44.906 kg (99 lb)  BMI 17.54 kg/m2 General: NAD Neck: No JVD, no thyromegaly or thyroid nodule.  Lungs: Clear to auscultation bilaterally with normal respiratory effort. CV: Nondisplaced PMI.  Heart  irregular S1/S2, no S3/S4, 1/6 SEM RUSB.  No peripheral edema.  +lower leg varicosities.  No carotid bruit.  Normal pedal pulses.  Abdomen: Soft, nontender, no hepatosplenomegaly, no distention.  Skin: Intact without lesions or rashes.  Neurologic: Alert and oriented x 3.  Psych: Normal affect. Extremities: No clubbing or cyanosis.   Assessment/Plan: 1. Atrial fibrillation: Chronic x years.  She is on warfarin but has had no problems with it and wants to continue it (rather than switching over to a NOAC).  Rate control is reasonable now but has required Toprol XL, diltiazem CD, and digoxin.  Check digoxin level today.  She has a history of CVA, so if she has to go off warfarin in the future, will need bridging Lovenox.  2. Chronic diastolic CHF: Patient does not appear volume overloaded on exam.  NYHA class II.  EF normal on last echo.  Continue current Lasix. Check BMET/BNP.  3. Carotid stenosis: Stable x several years.  No followup unless she develops symptoms.   4. Hyperlipidemia: Goal LDL < 70 with known vascular disease (carotid stenosis).  Continue pravastatin, will need to check lipids. She had myalgias with atorvastatin.  5. HTN: BP is under reasonable control.   6. Bradycardia: Past holter monitoring has shown < 3 second pauses.  As long as pauses are relatively short and she is asymptomatic, no changes.  At some point, she may end up needing a pacemaker for tachy-brady syndrome. 7. Urinary frequency/urgency: Will check UA to make sure UTI has cleared . She needs to call to get back in to see her urologist.    [name] 05/23/2014

## 2014-05-23 NOTE — Patient Instructions (Signed)
Your physician recommends that you return for lab work today--BMET/BNP/Digoxin level/urinalysis/urine culture.  Your physician recommends that you schedule a follow-up appointment in: 4 months with Dr Aundra Dubin.   Schedule an appointment with your urologist about your bladder spasms.

## 2014-05-24 LAB — DIGOXIN LEVEL: Digoxin Level: 0.5 ng/mL — ABNORMAL LOW (ref 0.8–2.0)

## 2014-06-05 ENCOUNTER — Ambulatory Visit (INDEPENDENT_AMBULATORY_CARE_PROVIDER_SITE_OTHER): Payer: Medicare Other | Admitting: *Deleted

## 2014-06-05 DIAGNOSIS — I4891 Unspecified atrial fibrillation: Secondary | ICD-10-CM

## 2014-06-05 DIAGNOSIS — Z5181 Encounter for therapeutic drug level monitoring: Secondary | ICD-10-CM

## 2014-06-05 LAB — POCT INR: INR: 3

## 2014-06-20 ENCOUNTER — Ambulatory Visit (INDEPENDENT_AMBULATORY_CARE_PROVIDER_SITE_OTHER): Payer: Medicare Other | Admitting: Family Medicine

## 2014-06-20 DIAGNOSIS — Z23 Encounter for immunization: Secondary | ICD-10-CM

## 2014-06-22 NOTE — Telephone Encounter (Signed)
Rx done and sent on 04/19/14.

## 2014-07-10 ENCOUNTER — Ambulatory Visit (INDEPENDENT_AMBULATORY_CARE_PROVIDER_SITE_OTHER): Payer: Medicare Other | Admitting: *Deleted

## 2014-07-10 DIAGNOSIS — Z5181 Encounter for therapeutic drug level monitoring: Secondary | ICD-10-CM

## 2014-07-10 DIAGNOSIS — I4891 Unspecified atrial fibrillation: Secondary | ICD-10-CM

## 2014-07-10 LAB — POCT INR: INR: 3.5

## 2014-07-19 ENCOUNTER — Encounter: Payer: Self-pay | Admitting: Physician Assistant

## 2014-07-19 ENCOUNTER — Ambulatory Visit (INDEPENDENT_AMBULATORY_CARE_PROVIDER_SITE_OTHER): Payer: Medicare Other | Admitting: Physician Assistant

## 2014-07-19 ENCOUNTER — Telehealth: Payer: Self-pay | Admitting: Cardiology

## 2014-07-19 VITALS — BP 110/70 | HR 54 | Wt 102.6 lb

## 2014-07-19 DIAGNOSIS — I10 Essential (primary) hypertension: Secondary | ICD-10-CM

## 2014-07-19 DIAGNOSIS — I5032 Chronic diastolic (congestive) heart failure: Secondary | ICD-10-CM

## 2014-07-19 DIAGNOSIS — I482 Chronic atrial fibrillation, unspecified: Secondary | ICD-10-CM

## 2014-07-19 DIAGNOSIS — W19XXXA Unspecified fall, initial encounter: Secondary | ICD-10-CM

## 2014-07-19 LAB — PROTIME-INR
INR: 2.4 ratio — AB (ref 0.8–1.0)
PROTHROMBIN TIME: 25.9 s — AB (ref 9.6–13.1)

## 2014-07-19 NOTE — Progress Notes (Signed)
[facility], [address], [place]  [address] Phone: [phone] Fax:  [phone]  Date:  07/19/2014   Patient ID:  [name], [name] April 03, 1926, MRN [id number]   PCP:  Laurey Morale, MD  Cardiologist: Aundra Dubin   History of Present Illness: [name] is a 80 y.o. female with history of chronic atrial fibrillation, pauses <3 sec on prior monitoring, chronic diastolic CHF, HTN, HLD, CVA, mild carotid artery disease, h/o UTIs, chronic low back pain who presents to clinic as an add-on to the flex schedule. She is here for evaluation s/p fall.  She was in her usual state of health overnight when she got up to urinate in the middle of the night. Unfortunately her power was out. She was able to feel her way to the bathroom but it was extremely dark and she kept bumping into things. She finally made it to the bathroom and went to sit on the commode but missed it. She landed on her bottom. She did not hit her head. This incident was not due to near-syncope or syncope. She denies any mental status changes, chest pain, dyspnea, palpitations, or LEE. She was afraid to try and stand up so she crawled to the other room and activated life alert. EMS came and checked her out along with her daughters. HR was found to be 110 in AF but otherwise she was deemed stable and not in need of hospital care. She was instructed to try and f/u with her doctor today, prompting today's visit. She does not have any bruising. Her legs feel a little sore from shimmying on the carpet but she is ambulating without difficulty. She has no focal neurologic deficits.  Recent Labs: 01/03/2014: TSH 5.28  01/09/2014: ALT 10; Hemoglobin 12.1  05/23/2014: Creatinine 0.8; Potassium 4.0; Pro B Natriuretic peptide (BNP) 326.0*   Wt Readings from Last 3 Encounters:  07/19/14 102 lb 9.6 oz (46.539 kg)  05/23/14 99 lb (44.906 kg)  02/21/14 95 lb (43.092 kg)     Past Medical History  Diagnosis Date  . Chronic atrial  fibrillation     a. permanent;  b. chronic coumadin;  c. 10/2013 digoxin and atenolol d/c'd 2/2 pauses of <3 sec;  d. 01/2014 digoxin resumed @ 0.125 mg in setting of difficult to control rate and diast chf.  . Hypertension     a. hx of edema with Amlodipine.  . Hyperlipidemia   . Contact dermatitis and other eczema, due to unspecified cause     a. h/o myalgias with atorvastatin.  . Frequent UTI     Dr. Terance Hart sees pt  . Urticaria, unspecified   . Lumbago     chronic low back pain  . Gout, unspecified   . Hypothyroidism   . H/O: CVA (cardiovascular accident) 2008  . DJD (degenerative joint disease)   . Diverticulosis of colon (without mention of hemorrhage)   . Osteoporosis     las DEXA on 01/30/10  . History of melanoma     on right ear  . Skin cancer     forehead  . Blood clotting disorder   . Chronic diastolic heart failure     a. Echo (6/08):  EF 55-60%, mild MR;  b. Echo (10/2013):  EF 55%, mild AS  . History of CVA (cerebrovascular accident)     2008  . Bradycardia     a. Holter (1/15):  avg HR 78, 1.4% PVCs, several pauses </= 3 sec, b.  Holter (01/2014):  AFib, freq PVCs, HR 51-127, Avg HR 88, longest pause 2.9 sec - no changes made  . Aortic stenosis     a. mild on echo 10/2013  . Carotid stenosis     a. Carotid US (4/14):  RICA 40-59%;  b. Carotid US (01/2014) bilateral 1-39%    Current Outpatient Prescriptions  Medication Sig Dispense Refill  . allopurinol (ZYLOPRIM) 300 MG tablet TAKE 1 TABLET BY MOUTH EVERY DAY  90 tablet  1  . digoxin (LANOXIN) 0.125 MG tablet Take 0.5 tablets (0.0625 mg total) by mouth daily.  45 tablet  1  . diltiazem (CARDIZEM CD) 240 MG 24 hr capsule Take 1 capsule (240 mg total) by mouth daily.  90 capsule  1  . furosemide (LASIX) 20 MG tablet TAKE 1 TABLET BY MOUTH EVERY DAY  90 tablet  1  . levothyroxine (SYNTHROID, LEVOTHROID) 50 MCG tablet TAKE 1 TABLET BY MOUTH EVERY DAY  90 tablet  3  . metoprolol succinate (TOPROL-XL) 25 MG 24 hr tablet  Take 1 tablet (25 mg total) by mouth every evening.  90 tablet  1  . oxyCODONE-acetaminophen (PERCOCET/ROXICET) 5-325 MG per tablet Take 1 tablet by mouth every 4 (four) hours as needed for moderate pain or severe pain.  240 tablet  0  . potassium chloride (K-DUR,KLOR-CON) 10 MEQ tablet Take 1 tablet (10 mEq total) by mouth 2 (two) times daily.  180 tablet  1  . pravastatin (PRAVACHOL) 10 MG tablet TAKE 10 MG ON MON, WED  AND FRI'S  45 tablet  1  . warfarin (COUMADIN) 5 MG tablet Take 2.5-5 mg by mouth every evening. Take 1/2 tablet daily Wed & take 1 tablet all other days       No current facility-administered medications for this visit.    Allergies:   Amlodipine; Amoxicillin; Hydrocodone-acetaminophen; Nitrofurantoin; and Sulfonamide derivatives   Social History:  The patient  reports that she quit smoking about 19 years ago. She has never used smokeless tobacco. She reports that she does not drink alcohol or use illicit drugs.   Family History:  The patient's family history includes Anemia in her sister and another family member; COPD in an other family member; Colon polyps in her mother; Coronary artery disease in an other family member; Hypertension in her mother; Immunodeficiency in an other family member; Stroke in her father. There is no history of Colon cancer or Heart attack.   ROS:  Please see the history of present illness.  All other systems reviewed and negative.   PHYSICAL EXAM:  VS:  BP 110/70  Pulse 54  Wt 102 lb 9.6 oz (46.539 kg)  SpO2 97% Well developed thin WF in no acute distress HEENT: normal Neck: no JVD Cardiac:  normal S1, S2; irregular, controlled; no murmur Lungs:  clear to auscultation bilaterally, no wheezing, rhonchi or rales Abd: soft, nontender, no hepatomegaly Ext: no edema, good range of motion Skin: warm and dry, extensive check of skin including upper, lower body and torso reveals no ecchymosis or hematoma Neuro:  moves all extremities  spontaneously, no focal abnormalities noted, sensation in tact, no facial asymmetry, strength equal bilaterally, PERRLA, speech clear  EKG:  By EMS at 6:15: atrial fibrillation 110bpm, nonspecific ST-T changes in the inferior leads as well as V3-V6, no acute change from prior In clinic: atrial fibrillation 82bpm, incomplete RBBB, occasional multifocal PVCs,similar TW changes (inversion) in the inferior leads as well as V3-V6  ASSESSMENT AND PLAN:  1. Fall,  initial encounter - no focal injuries. Neuro exam is completely normal. Her family wants her to start wearing Depends at night so that she doesn't rush out of bed in the middle of the night and I think this is a great idea. I will ask Coumadin clinic if they can check an INR today just to be safe since she was supratherapeutic the other day. Her daughter will be staying with her tonight. We discussed observing for mental status changes or any worsened clinical condition. They will proceed to the ER if there are any concerns but I am hopeful there will be no longterm deleterious effects from this. However, we did discuss the importance of fall prevention. If she falls again she was instructed to let Dr. Aundra Dubin know since she is on Coumadin. She's only had one other mild trip-and-fall in the last several years. For pain relief of aches/pains related to today's fall, I advised Tylenol as directed. She also takes 1/2 tablet of Percocet nightly so I reminded her family this also contains Tylenol and not to exceed the recommended limit of acetaminophen. 2. Chronic atrial fibrillation - no changes today. INR check as above. 3. Chronic diastolic CHF - appears euvolemic. 4. HTN - stable. 5. H/o bradycardia with pauses <3 sec - HR appears OK on EKGs from today. No medication changes today.  Dispo: F/u with Dr. Aundra Dubin as scheduled in December.  Addendum: Called patient to let her know of INR of 2.4 (within goal). Continue current Coumadin dose. Keep next  Coumadin Clinic appointment as scheduled.  Signed, Melina Copa, PA-C  07/19/2014 3:06 PM

## 2014-07-19 NOTE — Telephone Encounter (Signed)
New message           Pt daughter is calling to see if pt can come in today and be checked / pt took a bad fall last night / EMS came out to the house and checked her and she is doing fine but wants to be sure

## 2014-07-19 NOTE — Telephone Encounter (Signed)
Pt states she got up to go to the bathroom in the middle of the night last night. Her power was off and it was very dark in her house.  She became disoriented,wondering from room to room and finally found the bathroom. She did not have any other symptoms,including headache,vision or speech problems,or muscle weakness. She is not sure if her disorientation was due to darkness in her house. She fell, she thinks she may have missed the toilet when she sat down, and landed on her back and hip. She did not hit her head.  EMS was called and EKG done, a fib with rate up to 130 per pt's daughter.  No further evaluation was done last night. EMS advised her to call today for an appt.   Pt currently feels fine without any symptoms.

## 2014-07-19 NOTE — Telephone Encounter (Signed)
Pt advised she has been scheduled to see PA today at [facility].

## 2014-07-19 NOTE — Patient Instructions (Addendum)
Your physician recommends that you continue on your current medications as directed. Please refer to the Current Medication list given to you to    Your physician recommends that you return for lab work today    INR     KEEP FOLLOW UP [place]

## 2014-07-24 ENCOUNTER — Ambulatory Visit (INDEPENDENT_AMBULATORY_CARE_PROVIDER_SITE_OTHER): Payer: Medicare Other

## 2014-07-24 DIAGNOSIS — I4891 Unspecified atrial fibrillation: Secondary | ICD-10-CM

## 2014-07-24 DIAGNOSIS — I482 Chronic atrial fibrillation, unspecified: Secondary | ICD-10-CM

## 2014-07-24 DIAGNOSIS — Z5181 Encounter for therapeutic drug level monitoring: Secondary | ICD-10-CM

## 2014-07-24 LAB — POCT INR: INR: 3.3

## 2014-08-07 ENCOUNTER — Ambulatory Visit (INDEPENDENT_AMBULATORY_CARE_PROVIDER_SITE_OTHER): Payer: Medicare Other | Admitting: *Deleted

## 2014-08-07 DIAGNOSIS — I482 Chronic atrial fibrillation, unspecified: Secondary | ICD-10-CM

## 2014-08-07 DIAGNOSIS — I4891 Unspecified atrial fibrillation: Secondary | ICD-10-CM

## 2014-08-07 DIAGNOSIS — Z5181 Encounter for therapeutic drug level monitoring: Secondary | ICD-10-CM

## 2014-08-07 LAB — POCT INR: INR: 2.4

## 2014-08-13 ENCOUNTER — Other Ambulatory Visit: Payer: Self-pay | Admitting: Cardiology

## 2014-08-22 ENCOUNTER — Other Ambulatory Visit: Payer: Self-pay | Admitting: *Deleted

## 2014-08-22 MED ORDER — WARFARIN SODIUM 5 MG PO TABS: 5.0000 mg | ORAL_TABLET | ORAL | Status: DC

## 2014-08-28 ENCOUNTER — Ambulatory Visit (INDEPENDENT_AMBULATORY_CARE_PROVIDER_SITE_OTHER): Payer: Medicare Other | Admitting: Pharmacist

## 2014-08-28 DIAGNOSIS — I482 Chronic atrial fibrillation, unspecified: Secondary | ICD-10-CM

## 2014-08-28 DIAGNOSIS — Z5181 Encounter for therapeutic drug level monitoring: Secondary | ICD-10-CM

## 2014-08-28 DIAGNOSIS — I4891 Unspecified atrial fibrillation: Secondary | ICD-10-CM

## 2014-08-28 LAB — POCT INR: INR: 1.8

## 2014-09-11 ENCOUNTER — Ambulatory Visit (INDEPENDENT_AMBULATORY_CARE_PROVIDER_SITE_OTHER): Payer: Medicare Other

## 2014-09-11 DIAGNOSIS — Z5181 Encounter for therapeutic drug level monitoring: Secondary | ICD-10-CM

## 2014-09-11 DIAGNOSIS — I4891 Unspecified atrial fibrillation: Secondary | ICD-10-CM

## 2014-09-11 DIAGNOSIS — I482 Chronic atrial fibrillation, unspecified: Secondary | ICD-10-CM

## 2014-09-11 LAB — POCT INR: INR: 2.1

## 2014-09-21 ENCOUNTER — Encounter: Payer: Self-pay | Admitting: Cardiology

## 2014-09-21 ENCOUNTER — Ambulatory Visit (INDEPENDENT_AMBULATORY_CARE_PROVIDER_SITE_OTHER): Payer: Medicare Other | Admitting: Cardiology

## 2014-09-21 VITALS — BP 128/80 | HR 53 | Ht 63.0 in | Wt 104.8 lb

## 2014-09-21 DIAGNOSIS — I482 Chronic atrial fibrillation, unspecified: Secondary | ICD-10-CM

## 2014-09-21 DIAGNOSIS — I10 Essential (primary) hypertension: Secondary | ICD-10-CM

## 2014-09-21 DIAGNOSIS — I5032 Chronic diastolic (congestive) heart failure: Secondary | ICD-10-CM

## 2014-09-21 NOTE — Patient Instructions (Signed)
Your physician recommends that you have  lab work today--Lipid profile/BMET/Digoxin level  Your physician wants you to follow-up in: 6 months with Dr Aundra Dubin. (June 2016).  You will receive a reminder letter in the mail two months in advance. If you don't receive a letter, please call our office to schedule the follow-up appointment.

## 2014-09-22 LAB — DIGOXIN LEVEL: Digoxin Level: 0.8 ng/mL (ref 0.8–2.0)

## 2014-09-22 LAB — LIPID PANEL
Cholesterol: 174 mg/dL (ref 0–200)
HDL: 40 mg/dL (ref >39)
LDL CALC: 98 mg/dL (ref 0–99)
Total CHOL/HDL Ratio: 4.4 Ratio
Triglycerides: 179 mg/dL — ABNORMAL HIGH (ref <150)
VLDL: 36 mg/dL (ref 0–40)

## 2014-09-22 LAB — BASIC METABOLIC PANEL
BUN: 14 mg/dL (ref 6–23)
CO2: 29 mEq/L (ref 19–32)
Calcium: 9.8 mg/dL (ref 8.4–10.5)
Chloride: 98 mEq/L (ref 96–112)
Creat: 0.88 mg/dL (ref 0.50–1.10)
Glucose, Bld: 92 mg/dL (ref 70–99)
POTASSIUM: 3.7 meq/L (ref 3.5–5.3)
SODIUM: 137 meq/L (ref 135–145)

## 2014-09-23 NOTE — Progress Notes (Signed)
Patient ID: [name], female   DOB: [birth year], 80 y.o.   MRN: [id number] PCP: Dr. Sarajane Jews  80 yo with history of chronic atrial fibrillation but also history of pauses and chronic diastolic CHF presents for cardiology followup.  She was admitted to the hospital in 4/15 with atrial fibrillation/RVR and acute on chronic diastolic CHF.  She was diuresed and digoxin was restarted for rate control.  Holter done after restarting digoxin showed average HR 88 (atrial fibrillation) with occasional pauses, longest was 2.9 seconds.   Currently, she is doing well.  No chest pain. No lightheadedness or syncope.  She had one fall in 10/15 when she got out of bed at night in the dark and tripped.  She is not short of breath walking on flat ground and can climb a flight of steps without problems.  She has low back pain from arthritis and scoliosis.    Labs (3/14): K 3.9, creatinine 0.8, LDL 78, HDL 31 Labs (1/15): K 3.7, creatinine 0.8 Labs (4/15): K 4, creatinine 0.9 Labs (6/15): digoxin 0.7 Labs (8/15): digoxin 0.5, BNP 326, K 4, creatinine 0.8  PMH: 1. Chronic atrial fibrillation 2. Gout 3. Diastolic CHF: Echo (5/95) with EF 55-60%, mild MR.  Echo (1/15) with EF 55%, probably at least mild AS.  4. HTN: Edema with amlodipine . 5. Hyperlipidemia: myalgias with atorvastatin 6. H/o UTIs  7. Chronic low back pain.  8. Hypothyroidism 9. CVA in 2008.  10.  OA 11. Diverticulosis 12. Osteoporosis 13. H/o melanoma 14. Carotid stenosis: Dopplers (4/14) with 40-59% RICA stenosis (has been stable).  15. Bradycardia: Holter (1/15) with average HR 78, 1.4% PVCs, several pauses </= 3 seconds.  Holter (4/15) with atrial fibrillation, average HR 88, frequent PVCs, longest pause 2.9 sec 16. Aortic stenosis: At least mild on 1/15 echo.   SH: Widow, 3 children, lives in [place], prior smoker quit '96.   FH: Sister with CAD  Current Outpatient Prescriptions  Medication Sig Dispense Refill  . allopurinol  (ZYLOPRIM) 300 MG tablet TAKE 1 TABLET BY MOUTH EVERY DAY 90 tablet 1  . digoxin (LANOXIN) 0.125 MG tablet Take 0.5 tablets (0.0625 mg total) by mouth daily. 45 tablet 1  . diltiazem (CARDIZEM CD) 240 MG 24 hr capsule Take 1 capsule (240 mg total) by mouth daily. 90 capsule 1  . furosemide (LASIX) 20 MG tablet TAKE 1 TABLET BY MOUTH EVERY DAY 90 tablet 1  . levothyroxine (SYNTHROID, LEVOTHROID) 50 MCG tablet TAKE 1 TABLET BY MOUTH EVERY DAY 90 tablet 3  . metoprolol succinate (TOPROL-XL) 25 MG 24 hr tablet Take 1 tablet (25 mg total) by mouth every evening. 90 tablet 1  . oxyCODONE-acetaminophen (PERCOCET/ROXICET) 5-325 MG per tablet Take 1 tablet by mouth every 4 (four) hours as needed for moderate pain or severe pain. 240 tablet 0  . potassium chloride (K-DUR,KLOR-CON) 10 MEQ tablet Take 1 tablet (10 mEq total) by mouth 2 (two) times daily. 180 tablet 1  . pravastatin (PRAVACHOL) 10 MG tablet TAKE 10 MG ON MON, WED  AND FRI'S 45 tablet 1  . warfarin (COUMADIN) 5 MG tablet Take 1 tablet (5 mg total) by mouth as directed. Take 1/2 tablet daily Wed & take 1 tablet all other days 90 tablet 1   No current facility-administered medications for this visit.    BP 128/80 mmHg  Pulse 53  Ht 5\' 3"  (1.6 m)  Wt 104 lb 12.8 oz (47.537 kg)  BMI 18.57 kg/m2  SpO2 99% General:  NAD Neck: No JVD, no thyromegaly or thyroid nodule.  Lungs: Clear to auscultation bilaterally with normal respiratory effort. CV: Nondisplaced PMI.  Heart irregular S1/S2, no S3/S4, 1/6 SEM RUSB.  No peripheral edema.  +lower leg varicosities.  No carotid bruit.  Normal pedal pulses.  Abdomen: Soft, nontender, no hepatosplenomegaly, no distention.  Skin: Intact without lesions or rashes.  Neurologic: Alert and oriented x 3.  Psych: Normal affect. Extremities: No clubbing or cyanosis.   Assessment/Plan: 1. Atrial fibrillation: Chronic x years.  She is on warfarin but has had no problems with it and wants to continue it (rather  than switching over to a NOAC).  Rate control is reasonable now but has required Toprol XL, diltiazem CD, and digoxin.  Check digoxin level today.  She has a history of CVA, so if she has to go off warfarin in the future, will need bridging Lovenox.  2. Chronic diastolic CHF: Patient does not appear volume overloaded on exam.  NYHA class II.  EF normal on last echo.  Continue current Lasix. Check BMET.  3. Carotid stenosis: Stable x several years.  No followup unless she develops symptoms.   4. Hyperlipidemia: Goal LDL < 70 with known vascular disease (carotid stenosis).  Continue pravastatin, will need to check lipids. She had myalgias with atorvastatin.  5. HTN: BP is under reasonable control.   6. Bradycardia: Past holter monitoring has shown < 3 second pauses.  As long as pauses are relatively short and she is asymptomatic, no changes.  At some point, she may end up needing a pacemaker for tachy-brady syndrome.  [name] 09/23/2014

## 2014-09-24 ENCOUNTER — Telehealth: Payer: Self-pay | Admitting: Cardiology

## 2014-09-24 NOTE — Telephone Encounter (Signed)
Spoke with patient about recent lab results 

## 2014-09-24 NOTE — Telephone Encounter (Signed)
New Msg     Pt returning call to [facility], please call back.

## 2014-10-02 ENCOUNTER — Ambulatory Visit (INDEPENDENT_AMBULATORY_CARE_PROVIDER_SITE_OTHER): Payer: Medicare Other | Admitting: *Deleted

## 2014-10-02 DIAGNOSIS — I4891 Unspecified atrial fibrillation: Secondary | ICD-10-CM

## 2014-10-02 DIAGNOSIS — Z5181 Encounter for therapeutic drug level monitoring: Secondary | ICD-10-CM

## 2014-10-02 DIAGNOSIS — I482 Chronic atrial fibrillation, unspecified: Secondary | ICD-10-CM

## 2014-10-02 LAB — POCT INR: INR: 1.9

## 2014-10-12 ENCOUNTER — Other Ambulatory Visit: Payer: Self-pay | Admitting: Cardiology

## 2014-10-23 ENCOUNTER — Ambulatory Visit (INDEPENDENT_AMBULATORY_CARE_PROVIDER_SITE_OTHER): Payer: PPO | Admitting: *Deleted

## 2014-10-23 DIAGNOSIS — I482 Chronic atrial fibrillation, unspecified: Secondary | ICD-10-CM

## 2014-10-23 DIAGNOSIS — Z5181 Encounter for therapeutic drug level monitoring: Secondary | ICD-10-CM

## 2014-10-23 DIAGNOSIS — I4891 Unspecified atrial fibrillation: Secondary | ICD-10-CM

## 2014-10-23 LAB — POCT INR: INR: 3.1

## 2014-11-12 ENCOUNTER — Other Ambulatory Visit: Payer: Self-pay | Admitting: Cardiology

## 2014-11-13 ENCOUNTER — Ambulatory Visit (INDEPENDENT_AMBULATORY_CARE_PROVIDER_SITE_OTHER): Payer: PPO | Admitting: *Deleted

## 2014-11-13 DIAGNOSIS — Z5181 Encounter for therapeutic drug level monitoring: Secondary | ICD-10-CM

## 2014-11-13 DIAGNOSIS — I482 Chronic atrial fibrillation, unspecified: Secondary | ICD-10-CM

## 2014-11-13 DIAGNOSIS — I4891 Unspecified atrial fibrillation: Secondary | ICD-10-CM

## 2014-11-13 LAB — POCT INR: INR: 2.6

## 2014-11-20 ENCOUNTER — Other Ambulatory Visit: Payer: Self-pay | Admitting: Cardiology

## 2014-11-27 ENCOUNTER — Other Ambulatory Visit: Payer: Self-pay | Admitting: Cardiology

## 2014-12-05 ENCOUNTER — Telehealth: Payer: Self-pay | Admitting: *Deleted

## 2014-12-05 NOTE — Telephone Encounter (Signed)
Dr Aundra Dubin received a copy of a  letter from [employer] [phone]) to patient that pt had been given a limited supply of Pravastatin 10mg  and to ask her doctor to contact them for options.  I spoke with Verdis Frederickson at [phone] states that pravastatin is on formulary and that an exception would be necessary for a quantity limit if more than 30 tabs for 30 days.  Verdis Frederickson advised that current directions for pravastatin 10mg  are 10mg  on Mon-Wed-Fri., so that it would not exceed the quantity limit of 30 tabs for 30 days.

## 2014-12-07 ENCOUNTER — Ambulatory Visit (INDEPENDENT_AMBULATORY_CARE_PROVIDER_SITE_OTHER): Payer: PPO | Admitting: Pharmacist

## 2014-12-07 DIAGNOSIS — I4891 Unspecified atrial fibrillation: Secondary | ICD-10-CM

## 2014-12-07 DIAGNOSIS — Z5181 Encounter for therapeutic drug level monitoring: Secondary | ICD-10-CM

## 2014-12-07 DIAGNOSIS — I482 Chronic atrial fibrillation, unspecified: Secondary | ICD-10-CM

## 2014-12-07 LAB — POCT INR: INR: 3.5

## 2014-12-26 ENCOUNTER — Ambulatory Visit (INDEPENDENT_AMBULATORY_CARE_PROVIDER_SITE_OTHER): Payer: PPO | Admitting: *Deleted

## 2014-12-26 DIAGNOSIS — I4891 Unspecified atrial fibrillation: Secondary | ICD-10-CM | POA: Diagnosis not present

## 2014-12-26 DIAGNOSIS — I482 Chronic atrial fibrillation, unspecified: Secondary | ICD-10-CM

## 2014-12-26 DIAGNOSIS — Z5181 Encounter for therapeutic drug level monitoring: Secondary | ICD-10-CM | POA: Diagnosis not present

## 2014-12-26 LAB — POCT INR: INR: 3

## 2015-01-09 ENCOUNTER — Ambulatory Visit (INDEPENDENT_AMBULATORY_CARE_PROVIDER_SITE_OTHER): Payer: PPO | Admitting: Family Medicine

## 2015-01-09 ENCOUNTER — Encounter: Payer: Self-pay | Admitting: Family Medicine

## 2015-01-09 VITALS — BP 115/91 | HR 108 | Temp 97.4°F | Ht 62.0 in | Wt 102.0 lb

## 2015-01-09 DIAGNOSIS — R300 Dysuria: Secondary | ICD-10-CM

## 2015-01-09 DIAGNOSIS — E038 Other specified hypothyroidism: Secondary | ICD-10-CM

## 2015-01-09 DIAGNOSIS — Z0000 Encounter for general adult medical examination without abnormal findings: Secondary | ICD-10-CM | POA: Diagnosis not present

## 2015-01-09 LAB — LIPID PANEL
CHOL/HDL RATIO: 4
Cholesterol: 174 mg/dL (ref 0–200)
HDL: 41.7 mg/dL (ref >39.00)
LDL Cholesterol: 113 mg/dL — ABNORMAL HIGH (ref 0–99)
NONHDL: 132.3
Triglycerides: 99 mg/dL (ref 0.0–149.0)
VLDL: 19.8 mg/dL (ref 0.0–40.0)

## 2015-01-09 LAB — BASIC METABOLIC PANEL
BUN: 15 mg/dL (ref 6–23)
CO2: 27 mEq/L (ref 19–32)
CREATININE: 0.86 mg/dL (ref 0.40–1.20)
Calcium: 9.8 mg/dL (ref 8.4–10.5)
Chloride: 101 mEq/L (ref 96–112)
GFR: 66.39 mL/min (ref >60.00)
Glucose, Bld: 89 mg/dL (ref 70–99)
POTASSIUM: 4.1 meq/L (ref 3.5–5.1)
Sodium: 135 mEq/L (ref 135–145)

## 2015-01-09 LAB — CBC WITH DIFFERENTIAL/PLATELET
BASOS ABS: 0 10*3/uL (ref 0.0–0.1)
BASOS PCT: 0.3 % (ref 0.0–3.0)
EOS ABS: 0.1 10*3/uL (ref 0.0–0.7)
Eosinophils Relative: 1.5 % (ref 0.0–5.0)
HCT: 38.6 % (ref 36.0–46.0)
Hemoglobin: 12.6 g/dL (ref 12.0–15.0)
LYMPHS ABS: 1.8 10*3/uL (ref 0.7–4.0)
Lymphocytes Relative: 18.5 % (ref 12.0–46.0)
MCHC: 32.6 g/dL (ref 30.0–36.0)
MCV: 100.8 fl — ABNORMAL HIGH (ref 78.0–100.0)
MONO ABS: 0.8 10*3/uL (ref 0.1–1.0)
Monocytes Relative: 8.8 % (ref 3.0–12.0)
NEUTROS ABS: 6.8 10*3/uL (ref 1.4–7.7)
NEUTROS PCT: 70.9 % (ref 43.0–77.0)
Platelets: 239 10*3/uL (ref 150.0–400.0)
RBC: 3.83 Mil/uL — AB (ref 3.87–5.11)
RDW: 14.6 % (ref 11.5–15.5)
WBC: 9.5 10*3/uL (ref 4.0–10.5)

## 2015-01-09 LAB — HEPATIC FUNCTION PANEL
ALT: 9 U/L (ref 0–35)
AST: 17 U/L (ref 0–37)
Albumin: 3.8 g/dL (ref 3.5–5.2)
Alkaline Phosphatase: 63 U/L (ref 39–117)
BILIRUBIN TOTAL: 0.9 mg/dL (ref 0.2–1.2)
Bilirubin, Direct: 0.2 mg/dL (ref 0.0–0.3)
Total Protein: 8.3 g/dL (ref 6.0–8.3)

## 2015-01-09 LAB — POCT URINALYSIS DIPSTICK
Bilirubin, UA: NEGATIVE
GLUCOSE UA: NEGATIVE
Ketones, UA: NEGATIVE
Nitrite, UA: POSITIVE
SPEC GRAV UA: 1.015
UROBILINOGEN UA: 1
pH, UA: 7

## 2015-01-09 LAB — T3, FREE: T3 FREE: 2.4 pg/mL (ref 2.3–4.2)

## 2015-01-09 LAB — TSH: TSH: 2.22 u[IU]/mL (ref 0.35–4.50)

## 2015-01-09 LAB — T4, FREE: Free T4: 0.99 ng/dL (ref 0.60–1.60)

## 2015-01-09 MED ORDER — OXYCODONE-ACETAMINOPHEN 5-325 MG PO TABS: 1.0000 | ORAL_TABLET | ORAL | Status: DC | PRN

## 2015-01-09 MED ORDER — LEVOTHYROXINE SODIUM 50 MCG PO TABS: 50.0000 ug | ORAL_TABLET | Freq: Every day | ORAL | Status: DC

## 2015-01-09 NOTE — Progress Notes (Signed)
Pre visit review using our clinic review tool, if applicable. No additional management support is needed unless otherwise documented below in the visit note. 

## 2015-01-09 NOTE — Addendum Note (Signed)
Addended by: Elmer Picker on: 01/09/2015 12:31 PM   Modules accepted: Orders

## 2015-01-09 NOTE — Progress Notes (Signed)
   Subjective:    Patient ID: [name], female    DOB: 03/10/1926, 81 y.o.   MRN: [id number]  HPI 81 yr old female for a cpx. She feels well in general except for her arthritis pains. She takes some oxycodone at night to help with sleep but nothing during the day.    Review of Systems  Constitutional: Negative.   HENT: Negative.   Eyes: Negative.   Respiratory: Negative.   Cardiovascular: Negative.   Gastrointestinal: Negative.   Genitourinary: Negative for dysuria, urgency, frequency, hematuria, flank pain, decreased urine volume, enuresis, difficulty urinating, pelvic pain and dyspareunia.  Musculoskeletal: Negative.   Skin: Negative.   Neurological: Negative.   Psychiatric/Behavioral: Negative.        Objective:   Physical Exam  Constitutional: She is oriented to person, place, and time. She appears well-developed and well-nourished. No distress.  HENT:  Head: Normocephalic and atraumatic.  Right Ear: External ear normal.  Left Ear: External ear normal.  Nose: Nose normal.  Mouth/Throat: Oropharynx is clear and moist. No oropharyngeal exudate.  Eyes: Conjunctivae and EOM are normal. Pupils are equal, round, and reactive to light. No scleral icterus.  Neck: Normal range of motion. Neck supple. No JVD present. No thyromegaly present.  Cardiovascular: Normal rate, regular rhythm, normal heart sounds and intact distal pulses.  Exam reveals no gallop and no friction rub.   No murmur heard. Pulmonary/Chest: Effort normal and breath sounds normal. No respiratory distress. She has no wheezes. She has no rales. She exhibits no tenderness.  Abdominal: Soft. Bowel sounds are normal. She exhibits no distension and no mass. There is no tenderness. There is no rebound and no guarding.  Musculoskeletal: Normal range of motion. She exhibits no edema or tenderness.  Lymphadenopathy:    She has no cervical adenopathy.  Neurological: She is alert and oriented to person, place, and  time. She has normal reflexes. No cranial nerve deficit. She exhibits normal muscle tone. Coordination normal.  Skin: Skin is warm and dry. No rash noted. No erythema.  Psychiatric: She has a normal mood and affect. Her behavior is normal. Judgment and thought content normal.          Assessment & Plan:  Well exam. Get fasting labs. Suggested she take 2 ES Tylenols each morning to help with joint pains.

## 2015-01-11 LAB — URINE CULTURE: Colony Count: 8000

## 2015-01-14 MED ORDER — CIPROFLOXACIN HCL 500 MG PO TABS: 500.0000 mg | ORAL_TABLET | Freq: Two times a day (BID) | ORAL | Status: DC

## 2015-01-14 NOTE — Progress Notes (Signed)
   Subjective:    Patient ID: [name], female    DOB: [birth year], 81 y.o.   MRN: [id number]  HPI    Review of Systems     Objective:   Physical Exam        Assessment & Plan:  Call in Cipro 500 mg bid for 7 days

## 2015-01-14 NOTE — Addendum Note (Signed)
Addended by: Aggie Hacker A on: 01/14/2015 04:32 PM   Modules accepted: Orders

## 2015-01-15 ENCOUNTER — Encounter: Payer: Self-pay | Admitting: Family Medicine

## 2015-01-15 ENCOUNTER — Encounter: Payer: Self-pay | Admitting: Cardiology

## 2015-01-17 ENCOUNTER — Other Ambulatory Visit (HOSPITAL_COMMUNITY): Payer: Self-pay | Admitting: Cardiology

## 2015-01-17 DIAGNOSIS — I6523 Occlusion and stenosis of bilateral carotid arteries: Secondary | ICD-10-CM

## 2015-01-23 ENCOUNTER — Ambulatory Visit (INDEPENDENT_AMBULATORY_CARE_PROVIDER_SITE_OTHER): Payer: PPO | Admitting: *Deleted

## 2015-01-23 ENCOUNTER — Ambulatory Visit (HOSPITAL_COMMUNITY): Payer: PPO | Attending: Cardiovascular Disease

## 2015-01-23 DIAGNOSIS — I6523 Occlusion and stenosis of bilateral carotid arteries: Secondary | ICD-10-CM | POA: Insufficient documentation

## 2015-01-23 DIAGNOSIS — I4891 Unspecified atrial fibrillation: Secondary | ICD-10-CM

## 2015-01-23 DIAGNOSIS — Z5181 Encounter for therapeutic drug level monitoring: Secondary | ICD-10-CM | POA: Diagnosis not present

## 2015-01-23 DIAGNOSIS — I482 Chronic atrial fibrillation, unspecified: Secondary | ICD-10-CM

## 2015-01-23 LAB — POCT INR: INR: 3.4

## 2015-01-23 NOTE — Progress Notes (Signed)
Carotid duplex scan performed 

## 2015-01-29 ENCOUNTER — Other Ambulatory Visit: Payer: Self-pay | Admitting: Cardiology

## 2015-02-12 ENCOUNTER — Other Ambulatory Visit: Payer: Self-pay | Admitting: Cardiology

## 2015-02-13 ENCOUNTER — Encounter (HOSPITAL_COMMUNITY): Payer: PPO

## 2015-02-13 ENCOUNTER — Ambulatory Visit (INDEPENDENT_AMBULATORY_CARE_PROVIDER_SITE_OTHER): Payer: PPO | Admitting: *Deleted

## 2015-02-13 DIAGNOSIS — Z5181 Encounter for therapeutic drug level monitoring: Secondary | ICD-10-CM

## 2015-02-13 DIAGNOSIS — I482 Chronic atrial fibrillation, unspecified: Secondary | ICD-10-CM

## 2015-02-13 DIAGNOSIS — I4891 Unspecified atrial fibrillation: Secondary | ICD-10-CM | POA: Diagnosis not present

## 2015-02-13 LAB — POCT INR: INR: 4.5

## 2015-02-27 ENCOUNTER — Ambulatory Visit (INDEPENDENT_AMBULATORY_CARE_PROVIDER_SITE_OTHER): Payer: PPO | Admitting: *Deleted

## 2015-02-27 DIAGNOSIS — Z5181 Encounter for therapeutic drug level monitoring: Secondary | ICD-10-CM

## 2015-02-27 DIAGNOSIS — I482 Chronic atrial fibrillation, unspecified: Secondary | ICD-10-CM

## 2015-02-27 DIAGNOSIS — I4891 Unspecified atrial fibrillation: Secondary | ICD-10-CM

## 2015-02-27 LAB — POCT INR: INR: 3.1

## 2015-03-08 ENCOUNTER — Other Ambulatory Visit: Payer: Self-pay | Admitting: Cardiology

## 2015-03-13 ENCOUNTER — Ambulatory Visit (INDEPENDENT_AMBULATORY_CARE_PROVIDER_SITE_OTHER): Payer: PPO | Admitting: *Deleted

## 2015-03-13 DIAGNOSIS — I482 Chronic atrial fibrillation, unspecified: Secondary | ICD-10-CM

## 2015-03-13 DIAGNOSIS — Z5181 Encounter for therapeutic drug level monitoring: Secondary | ICD-10-CM | POA: Diagnosis not present

## 2015-03-13 DIAGNOSIS — I4891 Unspecified atrial fibrillation: Secondary | ICD-10-CM

## 2015-03-13 LAB — POCT INR: INR: 2

## 2015-03-16 ENCOUNTER — Other Ambulatory Visit: Payer: Self-pay | Admitting: Cardiology

## 2015-03-18 ENCOUNTER — Other Ambulatory Visit: Payer: Self-pay | Admitting: *Deleted

## 2015-03-18 MED ORDER — PRAVASTATIN SODIUM 10 MG PO TABS: ORAL_TABLET | ORAL | Status: DC

## 2015-04-03 ENCOUNTER — Ambulatory Visit (INDEPENDENT_AMBULATORY_CARE_PROVIDER_SITE_OTHER): Payer: PPO | Admitting: *Deleted

## 2015-04-03 DIAGNOSIS — I482 Chronic atrial fibrillation, unspecified: Secondary | ICD-10-CM

## 2015-04-03 DIAGNOSIS — Z5181 Encounter for therapeutic drug level monitoring: Secondary | ICD-10-CM | POA: Diagnosis not present

## 2015-04-03 DIAGNOSIS — I4891 Unspecified atrial fibrillation: Secondary | ICD-10-CM | POA: Diagnosis not present

## 2015-04-03 LAB — POCT INR: INR: 2.1

## 2015-04-09 ENCOUNTER — Other Ambulatory Visit: Payer: Self-pay | Admitting: Family Medicine

## 2015-04-16 LAB — HM MAMMOGRAPHY

## 2015-04-19 ENCOUNTER — Encounter: Payer: Self-pay | Admitting: Family Medicine

## 2015-04-26 ENCOUNTER — Other Ambulatory Visit: Payer: Self-pay | Admitting: Cardiology

## 2015-05-01 ENCOUNTER — Ambulatory Visit (INDEPENDENT_AMBULATORY_CARE_PROVIDER_SITE_OTHER): Payer: PPO | Admitting: Pharmacist

## 2015-05-01 DIAGNOSIS — I4891 Unspecified atrial fibrillation: Secondary | ICD-10-CM

## 2015-05-01 DIAGNOSIS — I482 Chronic atrial fibrillation, unspecified: Secondary | ICD-10-CM

## 2015-05-01 DIAGNOSIS — Z5181 Encounter for therapeutic drug level monitoring: Secondary | ICD-10-CM | POA: Diagnosis not present

## 2015-05-01 LAB — POCT INR: INR: 2.5

## 2015-05-12 ENCOUNTER — Other Ambulatory Visit: Payer: Self-pay | Admitting: Cardiology

## 2015-05-20 ENCOUNTER — Other Ambulatory Visit: Payer: Self-pay | Admitting: Cardiology

## 2015-05-24 ENCOUNTER — Other Ambulatory Visit: Payer: Self-pay | Admitting: Cardiology

## 2015-05-24 NOTE — Telephone Encounter (Signed)
Ok to refill? Please advise. Thanks, MI 

## 2015-05-27 NOTE — Telephone Encounter (Signed)
Dr Aundra Dubin would not prescribe this, check with her PCP.

## 2015-05-29 ENCOUNTER — Ambulatory Visit (INDEPENDENT_AMBULATORY_CARE_PROVIDER_SITE_OTHER): Payer: PPO | Admitting: *Deleted

## 2015-05-29 DIAGNOSIS — I482 Chronic atrial fibrillation, unspecified: Secondary | ICD-10-CM

## 2015-05-29 DIAGNOSIS — I4891 Unspecified atrial fibrillation: Secondary | ICD-10-CM | POA: Diagnosis not present

## 2015-05-29 DIAGNOSIS — Z5181 Encounter for therapeutic drug level monitoring: Secondary | ICD-10-CM | POA: Diagnosis not present

## 2015-05-29 LAB — POCT INR: INR: 2.6

## 2015-06-02 ENCOUNTER — Other Ambulatory Visit: Payer: Self-pay | Admitting: Cardiology

## 2015-06-03 NOTE — Telephone Encounter (Signed)
Ok to refill 

## 2015-06-03 NOTE — Telephone Encounter (Signed)
Dr Aundra Dubin would not prescribe this.

## 2015-06-21 ENCOUNTER — Other Ambulatory Visit: Payer: Self-pay | Admitting: Cardiology

## 2015-07-02 ENCOUNTER — Ambulatory Visit (INDEPENDENT_AMBULATORY_CARE_PROVIDER_SITE_OTHER): Payer: PPO | Admitting: Family Medicine

## 2015-07-02 DIAGNOSIS — Z23 Encounter for immunization: Secondary | ICD-10-CM

## 2015-07-08 ENCOUNTER — Other Ambulatory Visit: Payer: Self-pay | Admitting: Cardiology

## 2015-07-11 ENCOUNTER — Ambulatory Visit (INDEPENDENT_AMBULATORY_CARE_PROVIDER_SITE_OTHER): Payer: PPO | Admitting: Pharmacist

## 2015-07-11 DIAGNOSIS — Z5181 Encounter for therapeutic drug level monitoring: Secondary | ICD-10-CM

## 2015-07-11 DIAGNOSIS — I482 Chronic atrial fibrillation, unspecified: Secondary | ICD-10-CM

## 2015-07-11 DIAGNOSIS — I4891 Unspecified atrial fibrillation: Secondary | ICD-10-CM | POA: Diagnosis not present

## 2015-07-11 LAB — POCT INR: INR: 2.2

## 2015-07-23 ENCOUNTER — Other Ambulatory Visit: Payer: Self-pay | Admitting: Cardiology

## 2015-07-30 NOTE — Progress Notes (Signed)
Cardiology Office Note   Date:  07/31/2015   ID:  [name], DOB 18-May-1926, MRN [id number]  PCP:  Laurey Morale, MD  Cardiologist:  Dr. [name]   Electrophysiologist:  n/a  Chief Complaint  Patient presents with  . Follow-up  . Atrial Fibrillation  . Congestive Heart Failure     History of Present Illness: [name] is a 81 y.o. female with a hx of chronic AF, sinus node dysfunction, chronic diastolic HF.  She was admitted to the hospital in 4/15 with atrial fibrillation/RVR and acute on chronic diastolic CHF. She was diuresed and digoxin was restarted for rate control. Holter done after restarting digoxin showed average HR 88 (atrial fibrillation) with occasional pauses, longest was 2.9 seconds. Last seen by Dr. [name] 12/15.    Returns for FU.  Here today with her daughter. Overall, she has been doing well. She denies chest pain or significant shortness of breath. She denies syncope. Denies orthopnea, PND or edema. Denies any bleeding. She does have a lot of back pain.   Studies/Reports Reviewed Today:  Carotid US 4/16 Bilateral 1-39% ICA  Holter 4/15 Persistent AFib, Freq PVCs, HR 51-127, avg HR 88, longest pause 2.9 sec  Echo 1/15 EF 55%, no RWMA, probably mild AS, mild AI, mild MR, mild LAE   Past Medical History  Diagnosis Date  . Chronic atrial fibrillation (HCC)     a. permanent;  b. chronic coumadin;  c. 10/2013 digoxin and atenolol d/c'd 2/2 pauses of <3 sec;  d. 01/2014 digoxin resumed @ 0.125 mg in setting of difficult to control rate and diast chf.  . Hypertension     a. hx of edema with Amlodipine.  . Hyperlipidemia   . Contact dermatitis and other eczema, due to unspecified cause     a. h/o myalgias with atorvastatin.  . Frequent UTI     Dr. Terance Hart sees pt  . Urticaria, unspecified   . Lumbago     chronic low back pain  . Gout, unspecified   . Hypothyroidism   . H/O: CVA (cardiovascular accident) 2008  . DJD  (degenerative joint disease)   . Diverticulosis of colon (without mention of hemorrhage)   . Osteoporosis     las DEXA on 01/30/10  . History of melanoma     on right ear  . Skin cancer     forehead  . Blood clotting disorder ([place])   . Chronic diastolic heart failure ([place])     a. Echo (6/08):  EF 55-60%, mild MR;  b. Echo (10/2013):  EF 55%, mild AS  . History of CVA (cerebrovascular accident)     2008  . Bradycardia     a. Holter (1/15):  avg HR 78, 1.4% PVCs, several pauses </= 3 sec, b.  Holter (01/2014):  AFib, freq PVCs, HR 51-127, Avg HR 88, longest pause 2.9 sec - no changes made  . Aortic stenosis     a. mild on echo 10/2013  . Carotid stenosis     a. Carotid US (4/14):  RICA 40-59%;  b. Carotid US (01/2014) bilateral 1-39%  1. Chronic atrial fibrillation 2. Gout 3. Diastolic CHF: Echo (7/16) with EF 55-60%, mild MR. Echo (1/15) with EF 55%, probably at least mild AS.  4. HTN: Edema with amlodipine . 5. Hyperlipidemia: myalgias with atorvastatin 6. H/o UTIs  7. Chronic low back pain.  8. Hypothyroidism 9. CVA in 2008.  10. OA 11. Diverticulosis 12. Osteoporosis 13. H/o  melanoma 14. Carotid stenosis: Dopplers (4/14) with 40-59% RICA stenosis (has been stable).  15. Bradycardia: Holter (1/15) with average HR 78, 1.4% PVCs, several pauses </= 3 seconds. Holter (4/15) with atrial fibrillation, average HR 88, frequent PVCs, longest pause 2.9 sec 16. Aortic stenosis: At least mild on 1/15 echo.     Past Surgical History  Procedure Laterality Date  . Skin cancer excision  2001    removed right ear per Dr. Denna Haggard, skin grafts harvested from right cheek  . Abdominal hysterectomy    . Tonsillectomy    . Colonoscopy  07-02-11    Dr. Deatra Ina, adenomatous polyp, no repeats are planned   . Band hemorrhoidectomy  last on 10-28-11    x 2, per Dr. Deatra Ina   . Flexible sigmoidoscopy  10/28/2011    Procedure: FLEXIBLE SIGMOIDOSCOPY;  Surgeon: Inda Castle, MD;  Location: [facility]  ENDOSCOPY;  Service: Endoscopy;  Laterality: N/A;  . Gastric varices banding  10/28/2011    Procedure: HEMORRHOID BANDING;  Surgeon: Inda Castle, MD;  Location: [facility] ENDOSCOPY;  Service: Endoscopy;  Laterality: N/A;     Current Outpatient Prescriptions  Medication Sig Dispense Refill  . allopurinol (ZYLOPRIM) 300 MG tablet TAKE 1 TABLET BY MOUTH EVERY DAY 90 tablet 1  . digoxin (LANOXIN) 0.125 MG tablet TAKE 0.5 TABLETS (0.0625 MG TOTAL) BY MOUTH DAILY. 45 tablet 0  . diltiazem (CARDIZEM CD) 240 MG 24 hr capsule TAKE 1 CAPSULE (240 MG TOTAL) BY MOUTH DAILY. 90 capsule 0  . furosemide (LASIX) 20 MG tablet TAKE 1 TABLET BY MOUTH EVERY DAY 30 tablet 1  . KLOR-CON M10 10 MEQ tablet TAKE 1 TABLET (10 MEQ TOTAL) BY MOUTH 2 (TWO) TIMES DAILY. 60 tablet 0  . levothyroxine (SYNTHROID, LEVOTHROID) 50 MCG tablet Take 1 tablet (50 mcg total) by mouth daily. 90 tablet 3  . metoprolol succinate (TOPROL-XL) 25 MG 24 hr tablet TAKE 1 TABLET (25 MG TOTAL) BY MOUTH EVERY EVENING. 90 tablet 1  . oxyCODONE-acetaminophen (PERCOCET/ROXICET) 5-325 MG per tablet Take 1 tablet by mouth every 4 (four) hours as needed for moderate pain or severe pain. 240 tablet 0  . pravastatin (PRAVACHOL) 10 MG tablet TAKE 10 MG ON MON, WED AND FRI'S 45 tablet 0  . warfarin (COUMADIN) 5 MG tablet Take as directed by coumadin clinic 90 tablet 1   No current facility-administered medications for this visit.    Allergies:   Amlodipine; Amoxicillin; Hydrocodone-acetaminophen; Nitrofurantoin; and Sulfonamide derivatives    Social History:  The patient  reports that she quit smoking about 20 years ago. She has never used smokeless tobacco. She reports that she does not drink alcohol or use illicit drugs.   Family History:  The patient's family history includes Anemia in her sister and another family member; COPD in an other family member; Colon polyps in her mother; Coronary artery disease in an other family member; Hypertension in her  mother; Immunodeficiency in an other family member; Stroke in her father. There is no history of Colon cancer or Heart attack.    ROS:   Please see the history of present illness.   Review of Systems  Respiratory: Positive for cough.   All other systems reviewed and are negative.     PHYSICAL EXAM: VS:  BP 130/70 mmHg  Pulse 112  Ht 5\' 3"  (1.6 m)  Wt 96 lb 1.9 oz (43.6 kg)  BMI 17.03 kg/m2    Wt Readings from Last 3 Encounters:  07/31/15 96 lb 1.9  oz (43.6 kg)  01/09/15 102 lb (46.267 kg)  09/21/14 104 lb 12.8 oz (47.537 kg)     GEN: Thin frail elderly female, in no acute distress HEENT: normal Neck: no JVD,  no masses Cardiac:  Normal S1/S2, irregularly irregular rhythm; no murmur ,  no rubs or gallops, no edema   Respiratory:  clear to auscultation bilaterally, no wheezing, rhonchi or rales. GI: soft, nontender, nondistended, + BS MS: no deformity or atrophy Skin: warm and dry  Neuro:  CNs II-XII intact, Strength and sensation are intact Psych: Normal affect   EKG:  EKG is ordered today.  It demonstrates:   Atrial fibrillation, HR 112   Recent Labs: 01/09/2015: ALT 9; BUN 15; Creatinine, Ser 0.86; Hemoglobin 12.6; Platelets 239.0; Potassium 4.1; Sodium 135; TSH 2.22    Lipid Panel    Component Value Date/Time   CHOL 174 01/09/2015 1110   TRIG 99.0 01/09/2015 1110   HDL 41.70 01/09/2015 1110   CHOLHDL 4 01/09/2015 1110   VLDL 19.8 01/09/2015 1110   LDLCALC 113* 01/09/2015 1110   LDLDIRECT 141.5 08/06/2010 0944      ASSESSMENT AND PLAN:  1. Chronic atrial fibrillation: She has been maintained on Coumadin for anticoagulation.CHADS2-VASc= 7.  Rate today is somewhat elevated. As noted, Holter monitor has documented well-controlled heart rate with pauses up to 2.9 seconds. I will not make any further adjustments in her rate controlling therapy. Obtain BMET, CBC today to rule out dehydration or occult bleeding as a cause for her elevated heart rate.  2. Chronic  Diastolic CHF: Volume stable. Check BMET today.  3. Carotid stenosis: Stable times many years. No follow-up, she developed symptoms.  4. Hyperlipidemia: LDL in 4/16 was 113. She takes pravastatin 3 times a week.  5. HTN: Controlled.  6. Bradycardia: Previous Holter has demonstrated < 3 second pauses.  She denies syncope or near-syncope.    Medication Changes: Current medicines are reviewed at length with the patient today.  Concerns regarding medicines are as outlined above.  The following changes have been made:   Discontinued Medications   CIPROFLOXACIN (CIPRO) 500 MG TABLET    Take 1 tablet (500 mg total) by mouth 2 (two) times daily.   DILTIAZEM (CARDIZEM CD) 240 MG 24 HR CAPSULE    TAKE 1 CAPSULE (240 MG TOTAL) BY MOUTH DAILY.   PRAVASTATIN (PRAVACHOL) 10 MG TABLET    TAKE 10 MG ON MON, WED AND FRI'S   Modified Medications   No medications on file   New Prescriptions   No medications on file   Labs/ tests ordered today include:   Orders Placed This Encounter  Procedures  . Basic Metabolic Panel (BMET)  . CBC w/Diff  . EKG 12-Lead     Disposition:    FU with Dr. Aundra Dubin or me in 6 months    Signed, Versie Starks, MHS 07/31/2015 2:38 PM    [address], [place], [place]  [address] Phone: [phone]; Fax: [phone]

## 2015-07-31 ENCOUNTER — Encounter: Payer: Self-pay | Admitting: Physician Assistant

## 2015-07-31 ENCOUNTER — Ambulatory Visit (INDEPENDENT_AMBULATORY_CARE_PROVIDER_SITE_OTHER): Payer: PPO | Admitting: Physician Assistant

## 2015-07-31 VITALS — BP 130/70 | HR 112 | Ht 63.0 in | Wt 96.1 lb

## 2015-07-31 DIAGNOSIS — I10 Essential (primary) hypertension: Secondary | ICD-10-CM | POA: Diagnosis not present

## 2015-07-31 DIAGNOSIS — I4891 Unspecified atrial fibrillation: Secondary | ICD-10-CM

## 2015-07-31 DIAGNOSIS — I5032 Chronic diastolic (congestive) heart failure: Secondary | ICD-10-CM

## 2015-07-31 DIAGNOSIS — E785 Hyperlipidemia, unspecified: Secondary | ICD-10-CM

## 2015-07-31 DIAGNOSIS — I482 Chronic atrial fibrillation, unspecified: Secondary | ICD-10-CM

## 2015-07-31 DIAGNOSIS — R001 Bradycardia, unspecified: Secondary | ICD-10-CM

## 2015-07-31 DIAGNOSIS — I6523 Occlusion and stenosis of bilateral carotid arteries: Secondary | ICD-10-CM | POA: Diagnosis not present

## 2015-07-31 LAB — CBC WITH DIFFERENTIAL/PLATELET
BASOS PCT: 0 % (ref 0–1)
Basophils Absolute: 0 10*3/uL (ref 0.0–0.1)
Eosinophils Absolute: 0.2 10*3/uL (ref 0.0–0.7)
Eosinophils Relative: 3 % (ref 0–5)
HEMATOCRIT: 37.4 % (ref 36.0–46.0)
HEMOGLOBIN: 12.3 g/dL (ref 12.0–15.0)
LYMPHS ABS: 2.6 10*3/uL (ref 0.7–4.0)
LYMPHS PCT: 38 % (ref 12–46)
MCH: 32.2 pg (ref 26.0–34.0)
MCHC: 32.9 g/dL (ref 30.0–36.0)
MCV: 97.9 fL (ref 78.0–100.0)
MONOS PCT: 10 % (ref 3–12)
MPV: 9.4 fL (ref 8.6–12.4)
Monocytes Absolute: 0.7 10*3/uL (ref 0.1–1.0)
NEUTROS ABS: 3.3 10*3/uL (ref 1.7–7.7)
NEUTROS PCT: 49 % (ref 43–77)
Platelets: 245 10*3/uL (ref 150–400)
RBC: 3.82 MIL/uL — ABNORMAL LOW (ref 3.87–5.11)
RDW: 13.9 % (ref 11.5–15.5)
WBC: 6.8 10*3/uL (ref 4.0–10.5)

## 2015-07-31 LAB — BASIC METABOLIC PANEL
BUN: 10 mg/dL (ref 7–25)
CHLORIDE: 101 mmol/L (ref 98–110)
CO2: 27 mmol/L (ref 20–31)
Calcium: 9.8 mg/dL (ref 8.6–10.4)
Creat: 0.69 mg/dL (ref 0.60–0.88)
Glucose, Bld: 87 mg/dL (ref 65–99)
POTASSIUM: 4.3 mmol/L (ref 3.5–5.3)
SODIUM: 136 mmol/L (ref 135–146)

## 2015-07-31 NOTE — Patient Instructions (Signed)
Medication Instructions:  Your physician recommends that you continue on your current medications as directed. Please refer to the Current Medication list given to you today.   Labwork: TODAY BMET, CBC W/DIFF  Testing/Procedures: NONE  Follow-Up: Your physician wants you to follow-up in: [place] Aundra Dubin. You will receive a reminder letter in the mail two months in advance. If you don't receive a letter, please call our office to schedule the follow-up appointment.   Any Other Special Instructions Will Be Listed Below (If Applicable).     If you need a refill on your cardiac medications before your next appointment, please call your pharmacy.

## 2015-08-01 ENCOUNTER — Telehealth: Payer: Self-pay | Admitting: *Deleted

## 2015-08-01 NOTE — Telephone Encounter (Signed)
Pt notified of lab results by phone with verbal understanding.  

## 2015-08-17 ENCOUNTER — Other Ambulatory Visit: Payer: Self-pay | Admitting: Cardiology

## 2015-08-21 ENCOUNTER — Other Ambulatory Visit: Payer: Self-pay | Admitting: Cardiology

## 2015-08-22 ENCOUNTER — Ambulatory Visit (INDEPENDENT_AMBULATORY_CARE_PROVIDER_SITE_OTHER): Payer: PPO | Admitting: *Deleted

## 2015-08-22 DIAGNOSIS — I4891 Unspecified atrial fibrillation: Secondary | ICD-10-CM

## 2015-08-22 DIAGNOSIS — Z5181 Encounter for therapeutic drug level monitoring: Secondary | ICD-10-CM

## 2015-08-22 DIAGNOSIS — I482 Chronic atrial fibrillation, unspecified: Secondary | ICD-10-CM

## 2015-08-22 LAB — POCT INR: INR: 2.8

## 2015-08-28 ENCOUNTER — Other Ambulatory Visit: Payer: Self-pay | Admitting: Cardiology

## 2015-09-17 ENCOUNTER — Other Ambulatory Visit: Payer: Self-pay | Admitting: Cardiology

## 2015-10-02 ENCOUNTER — Ambulatory Visit (INDEPENDENT_AMBULATORY_CARE_PROVIDER_SITE_OTHER): Payer: PPO | Admitting: *Deleted

## 2015-10-02 DIAGNOSIS — I482 Chronic atrial fibrillation, unspecified: Secondary | ICD-10-CM

## 2015-10-02 DIAGNOSIS — Z5181 Encounter for therapeutic drug level monitoring: Secondary | ICD-10-CM | POA: Diagnosis not present

## 2015-10-02 DIAGNOSIS — I4891 Unspecified atrial fibrillation: Secondary | ICD-10-CM | POA: Diagnosis not present

## 2015-10-02 LAB — POCT INR: INR: 3.8

## 2015-10-04 ENCOUNTER — Other Ambulatory Visit: Payer: Self-pay | Admitting: Cardiology

## 2015-10-07 NOTE — Progress Notes (Signed)
   Subjective:    Patient ID: [name], female    DOB: 06/02/1926, 82 y.o.   MRN: [id number]  HPI This patient has active hyperlipidemia, essential HTN, diastolic heart failure, and occlusion with stenosis of carotid arteries. These are all well controlled.   Review of Systems     Objective:   Physical Exam        Assessment & Plan:

## 2015-10-17 ENCOUNTER — Ambulatory Visit (INDEPENDENT_AMBULATORY_CARE_PROVIDER_SITE_OTHER): Payer: PPO

## 2015-10-17 DIAGNOSIS — I4891 Unspecified atrial fibrillation: Secondary | ICD-10-CM | POA: Diagnosis not present

## 2015-10-17 DIAGNOSIS — I482 Chronic atrial fibrillation, unspecified: Secondary | ICD-10-CM

## 2015-10-17 DIAGNOSIS — Z5181 Encounter for therapeutic drug level monitoring: Secondary | ICD-10-CM

## 2015-10-17 LAB — POCT INR: INR: 2.2

## 2015-10-18 ENCOUNTER — Encounter: Payer: Self-pay | Admitting: Physician Assistant

## 2015-10-18 ENCOUNTER — Other Ambulatory Visit: Payer: Self-pay | Admitting: Cardiology

## 2015-11-01 ENCOUNTER — Other Ambulatory Visit (HOSPITAL_COMMUNITY): Payer: Self-pay | Admitting: *Deleted

## 2015-11-01 MED ORDER — WARFARIN SODIUM 5 MG PO TABS: 5.0000 mg | ORAL_TABLET | ORAL | Status: DC

## 2015-11-04 ENCOUNTER — Other Ambulatory Visit (HOSPITAL_COMMUNITY): Payer: Self-pay | Admitting: *Deleted

## 2015-11-13 ENCOUNTER — Other Ambulatory Visit: Payer: Self-pay | Admitting: Cardiology

## 2015-11-13 ENCOUNTER — Ambulatory Visit (INDEPENDENT_AMBULATORY_CARE_PROVIDER_SITE_OTHER): Payer: PPO | Admitting: *Deleted

## 2015-11-13 DIAGNOSIS — I4891 Unspecified atrial fibrillation: Secondary | ICD-10-CM | POA: Diagnosis not present

## 2015-11-13 DIAGNOSIS — I482 Chronic atrial fibrillation, unspecified: Secondary | ICD-10-CM

## 2015-11-13 DIAGNOSIS — Z5181 Encounter for therapeutic drug level monitoring: Secondary | ICD-10-CM

## 2015-11-13 LAB — POCT INR: INR: 3.2

## 2015-11-25 ENCOUNTER — Other Ambulatory Visit: Payer: Self-pay | Admitting: Cardiology

## 2015-11-25 NOTE — Telephone Encounter (Signed)
Rx request sent to pharmacy.  

## 2015-12-04 ENCOUNTER — Ambulatory Visit (INDEPENDENT_AMBULATORY_CARE_PROVIDER_SITE_OTHER): Payer: PPO | Admitting: *Deleted

## 2015-12-04 DIAGNOSIS — I482 Chronic atrial fibrillation, unspecified: Secondary | ICD-10-CM

## 2015-12-04 DIAGNOSIS — I4891 Unspecified atrial fibrillation: Secondary | ICD-10-CM | POA: Diagnosis not present

## 2015-12-04 DIAGNOSIS — Z5181 Encounter for therapeutic drug level monitoring: Secondary | ICD-10-CM | POA: Diagnosis not present

## 2015-12-04 LAB — POCT INR: INR: 3.1

## 2015-12-10 DIAGNOSIS — H35033 Hypertensive retinopathy, bilateral: Secondary | ICD-10-CM | POA: Diagnosis not present

## 2015-12-10 DIAGNOSIS — H524 Presbyopia: Secondary | ICD-10-CM | POA: Diagnosis not present

## 2015-12-20 ENCOUNTER — Ambulatory Visit (INDEPENDENT_AMBULATORY_CARE_PROVIDER_SITE_OTHER): Payer: PPO | Admitting: Pharmacist

## 2015-12-20 DIAGNOSIS — I4891 Unspecified atrial fibrillation: Secondary | ICD-10-CM

## 2015-12-20 DIAGNOSIS — I482 Chronic atrial fibrillation, unspecified: Secondary | ICD-10-CM

## 2015-12-20 DIAGNOSIS — Z5181 Encounter for therapeutic drug level monitoring: Secondary | ICD-10-CM

## 2015-12-20 LAB — POCT INR: INR: 2.4

## 2016-01-08 ENCOUNTER — Ambulatory Visit (INDEPENDENT_AMBULATORY_CARE_PROVIDER_SITE_OTHER): Payer: PPO | Admitting: *Deleted

## 2016-01-08 DIAGNOSIS — I482 Chronic atrial fibrillation, unspecified: Secondary | ICD-10-CM

## 2016-01-08 DIAGNOSIS — Z5181 Encounter for therapeutic drug level monitoring: Secondary | ICD-10-CM

## 2016-01-08 DIAGNOSIS — I4891 Unspecified atrial fibrillation: Secondary | ICD-10-CM | POA: Diagnosis not present

## 2016-01-08 LAB — POCT INR: INR: 1.9

## 2016-01-29 ENCOUNTER — Ambulatory Visit (INDEPENDENT_AMBULATORY_CARE_PROVIDER_SITE_OTHER): Payer: PPO | Admitting: *Deleted

## 2016-01-29 DIAGNOSIS — I4891 Unspecified atrial fibrillation: Secondary | ICD-10-CM | POA: Diagnosis not present

## 2016-01-29 DIAGNOSIS — I482 Chronic atrial fibrillation, unspecified: Secondary | ICD-10-CM

## 2016-01-29 DIAGNOSIS — Z5181 Encounter for therapeutic drug level monitoring: Secondary | ICD-10-CM | POA: Diagnosis not present

## 2016-01-29 LAB — POCT INR: INR: 2.1

## 2016-02-14 ENCOUNTER — Other Ambulatory Visit: Payer: Self-pay | Admitting: Cardiology

## 2016-02-14 ENCOUNTER — Emergency Department (HOSPITAL_COMMUNITY)
Admission: EM | Admit: 2016-02-14 | Discharge: 2016-02-14 | Disposition: A | Discharge status: Home / Self Care | Payer: PPO | Attending: Emergency Medicine | Admitting: Emergency Medicine

## 2016-02-14 ENCOUNTER — Encounter (HOSPITAL_COMMUNITY): Payer: Self-pay | Admitting: Emergency Medicine

## 2016-02-14 ENCOUNTER — Emergency Department (HOSPITAL_COMMUNITY): Payer: PPO

## 2016-02-14 DIAGNOSIS — I482 Chronic atrial fibrillation: Secondary | ICD-10-CM | POA: Diagnosis not present

## 2016-02-14 DIAGNOSIS — W01198A Fall on same level from slipping, tripping and stumbling with subsequent striking against other object, initial encounter: Secondary | ICD-10-CM | POA: Diagnosis not present

## 2016-02-14 DIAGNOSIS — Y998 Other external cause status: Secondary | ICD-10-CM | POA: Insufficient documentation

## 2016-02-14 DIAGNOSIS — Y9389 Activity, other specified: Secondary | ICD-10-CM | POA: Insufficient documentation

## 2016-02-14 DIAGNOSIS — E039 Hypothyroidism, unspecified: Secondary | ICD-10-CM | POA: Insufficient documentation

## 2016-02-14 DIAGNOSIS — Z872 Personal history of diseases of the skin and subcutaneous tissue: Secondary | ICD-10-CM | POA: Diagnosis not present

## 2016-02-14 DIAGNOSIS — I10 Essential (primary) hypertension: Secondary | ICD-10-CM | POA: Insufficient documentation

## 2016-02-14 DIAGNOSIS — S0181XA Laceration without foreign body of other part of head, initial encounter: Secondary | ICD-10-CM | POA: Insufficient documentation

## 2016-02-14 DIAGNOSIS — S3992XA Unspecified injury of lower back, initial encounter: Secondary | ICD-10-CM | POA: Diagnosis not present

## 2016-02-14 DIAGNOSIS — M109 Gout, unspecified: Secondary | ICD-10-CM | POA: Diagnosis not present

## 2016-02-14 DIAGNOSIS — Z85828 Personal history of other malignant neoplasm of skin: Secondary | ICD-10-CM | POA: Diagnosis not present

## 2016-02-14 DIAGNOSIS — Z7901 Long term (current) use of anticoagulants: Secondary | ICD-10-CM | POA: Insufficient documentation

## 2016-02-14 DIAGNOSIS — Y9289 Other specified places as the place of occurrence of the external cause: Secondary | ICD-10-CM | POA: Diagnosis not present

## 2016-02-14 DIAGNOSIS — I5032 Chronic diastolic (congestive) heart failure: Secondary | ICD-10-CM | POA: Insufficient documentation

## 2016-02-14 DIAGNOSIS — Z79899 Other long term (current) drug therapy: Secondary | ICD-10-CM | POA: Insufficient documentation

## 2016-02-14 DIAGNOSIS — M1990 Unspecified osteoarthritis, unspecified site: Secondary | ICD-10-CM | POA: Insufficient documentation

## 2016-02-14 DIAGNOSIS — Z8673 Personal history of transient ischemic attack (TIA), and cerebral infarction without residual deficits: Secondary | ICD-10-CM | POA: Insufficient documentation

## 2016-02-14 DIAGNOSIS — W19XXXA Unspecified fall, initial encounter: Secondary | ICD-10-CM

## 2016-02-14 DIAGNOSIS — Z8719 Personal history of other diseases of the digestive system: Secondary | ICD-10-CM | POA: Diagnosis not present

## 2016-02-14 DIAGNOSIS — G8929 Other chronic pain: Secondary | ICD-10-CM | POA: Diagnosis not present

## 2016-02-14 DIAGNOSIS — E785 Hyperlipidemia, unspecified: Secondary | ICD-10-CM | POA: Insufficient documentation

## 2016-02-14 DIAGNOSIS — I639 Cerebral infarction, unspecified: Secondary | ICD-10-CM | POA: Diagnosis not present

## 2016-02-14 DIAGNOSIS — S0990XA Unspecified injury of head, initial encounter: Secondary | ICD-10-CM | POA: Diagnosis not present

## 2016-02-14 DIAGNOSIS — Z880 Allergy status to penicillin: Secondary | ICD-10-CM | POA: Insufficient documentation

## 2016-02-14 DIAGNOSIS — Z87440 Personal history of urinary (tract) infections: Secondary | ICD-10-CM | POA: Insufficient documentation

## 2016-02-14 DIAGNOSIS — S098XXA Other specified injuries of head, initial encounter: Secondary | ICD-10-CM | POA: Diagnosis not present

## 2016-02-14 LAB — PROTIME-INR
INR: 2.42 — AB (ref 0.00–1.49)
PROTHROMBIN TIME: 26 s — AB (ref 11.6–15.2)

## 2016-02-14 MED ORDER — LIDOCAINE HCL (PF) 1 % IJ SOLN
5.0000 mL | Freq: Once | INTRAMUSCULAR | Status: AC
  Administered 2016-02-14: 5 mL
  Filled 2016-02-14: qty 5

## 2016-02-14 NOTE — ED Notes (Signed)
GEMS, mechanical fall, no LOC, hit head, no vomiting, takes coumadin, A/O X4, VSS, NAD, no neck pain

## 2016-02-14 NOTE — Discharge Instructions (Signed)
Facial Laceration ° A facial laceration is a cut on the face. These injuries can be painful and cause bleeding. Lacerations usually heal quickly, but they need special care to reduce scarring. °DIAGNOSIS  °Your health care provider will take a medical history, ask for details about how the injury occurred, and examine the wound to determine how deep the cut is. °TREATMENT  °Some facial lacerations may not require closure. Others may not be able to be closed because of an increased risk of infection. The risk of infection and the chance for successful closure will depend on various factors, including the amount of time since the injury occurred. °The wound may be cleaned to help prevent infection. If closure is appropriate, pain medicines may be given if needed. Your health care provider will use stitches (sutures), wound glue (adhesive), or skin adhesive strips to repair the laceration. These tools bring the skin edges together to allow for faster healing and a better cosmetic outcome. If needed, you may also be given a tetanus shot. °HOME CARE INSTRUCTIONS °· Only take over-the-counter or prescription medicines as directed by your health care provider. °· Follow your health care provider's instructions for wound care. These instructions will vary depending on the technique used for closing the wound. °For Sutures: °· Keep the wound clean and dry.   °· If you were given a bandage (dressing), you should change it at least once a day. Also change the dressing if it becomes wet or dirty, or as directed by your health care provider.   °· Wash the wound with soap and water 2 times a day. Rinse the wound off with water to remove all soap. Pat the wound dry with a clean towel.   °· After cleaning, apply a thin layer of the antibiotic ointment recommended by your health care provider. This will help prevent infection and keep the dressing from sticking.   °· You may shower as usual after the first 24 hours. Do not soak the  wound in water until the sutures are removed.   °· Get your sutures removed as directed by your health care provider. With facial lacerations, sutures should usually be taken out after 4-5 days to avoid stitch marks.   °· Wait a few days after your sutures are removed before applying any makeup. °For Skin Adhesive Strips: °· Keep the wound clean and dry.   °· Do not get the skin adhesive strips wet. You may bathe carefully, using caution to keep the wound dry.   °· If the wound gets wet, pat it dry with a clean towel.   °· Skin adhesive strips will fall off on their own. You may trim the strips as the wound heals. Do not remove skin adhesive strips that are still stuck to the wound. They will fall off in time.   °For Wound Adhesive: °· You may briefly wet your wound in the shower or bath. Do not soak or scrub the wound. Do not swim. Avoid periods of heavy sweating until the skin adhesive has fallen off on its own. After showering or bathing, gently pat the wound dry with a clean towel.   °· Do not apply liquid medicine, cream medicine, ointment medicine, or makeup to your wound while the skin adhesive is in place. This may loosen the film before your wound is healed.   °· If a dressing is placed over the wound, be careful not to apply tape directly over the skin adhesive. This may cause the adhesive to be pulled off before the wound is healed.   °· Avoid   prolonged exposure to sunlight or tanning lamps while the skin adhesive is in place. °· The skin adhesive will usually remain in place for 5-10 days, then naturally fall off the skin. Do not pick at the adhesive film.   °After Healing: °Once the wound has healed, cover the wound with sunscreen during the day for 1 full year. This can help minimize scarring. Exposure to ultraviolet light in the first year will darken the scar. It can take 1-2 years for the scar to lose its redness and to heal completely.  °SEEK MEDICAL CARE IF: °· You have a fever. °SEEK IMMEDIATE  MEDICAL CARE IF: °· You have redness, pain, or swelling around the wound.   °· You see a yellowish-white fluid (pus) coming from the wound.   °  °This information is not intended to replace advice given to you by your health care provider. Make sure you discuss any questions you have with your health care provider. °  °Document Released: 10/29/2004 Document Revised: 10/12/2014 Document Reviewed: 05/04/2013 °[employer]vier Interactive Patient Education ©2016 [employer] ° °

## 2016-02-14 NOTE — ED Provider Notes (Signed)
CSN: [id number]     Arrival date & time    History   First MD Initiated Contact with Patient 02/14/16 1219     Chief Complaint  Patient presents with  . Fall     Patient is a 82 y.o. female presenting with fall. The history is provided by the patient.  Fall Pertinent negatives include no abdominal pain and no shortness of breath.  Patient was out shopping when she tripped over a small step and fell, striking her forehead. No loss conscious. No neck pain. States only has some back pain, which is chronic for her. States it is unchanged since the fall. No numbness weakness. No neck pain. States her last tetanus was about 8 years ago. She has chronic A. fib and is on Coumadin. States he had some difficulty controlling the level. She has a laceration to her forehead and abrasion to her left cheek.  Past Medical History  Diagnosis Date  . Chronic atrial fibrillation (HCC)     a. permanent;  b. chronic coumadin;  c. 10/2013 digoxin and atenolol d/c'd 2/2 pauses of <3 sec;  d. 01/2014 digoxin resumed @ 0.125 mg in setting of difficult to control rate and diast chf.  . Hypertension     a. hx of edema with Amlodipine.  . Hyperlipidemia   . Contact dermatitis and other eczema, due to unspecified cause     a. h/o myalgias with atorvastatin.  . Frequent UTI     Dr. Terance Hart sees pt  . Urticaria, unspecified   . Lumbago     chronic low back pain  . Gout, unspecified   . Hypothyroidism   . H/O: CVA (cardiovascular accident) 2008  . DJD (degenerative joint disease)   . Diverticulosis of colon (without mention of hemorrhage)   . Osteoporosis     las DEXA on 01/30/10  . History of melanoma     on right ear  . Skin cancer     forehead  . Blood clotting disorder ([place])   . Chronic diastolic heart failure ([place])     a. Echo (6/08):  EF 55-60%, mild MR;  b. Echo (10/2013):  EF 55%, mild AS  . History of CVA (cerebrovascular accident)     2008  . Bradycardia     a. Holter (1/15):  avg HR 78, 1.4%  PVCs, several pauses </= 3 sec, b.  Holter (01/2014):  AFib, freq PVCs, HR 51-127, Avg HR 88, longest pause 2.9 sec - no changes made  . Aortic stenosis     a. mild on echo 10/2013  . Carotid stenosis     a. Carotid US (4/14):  RICA 40-59%;  b. Carotid US (01/2014) bilateral 1-39%   Past Surgical History  Procedure Laterality Date  . Skin cancer excision  2001    removed right ear per Dr. Denna Haggard, skin grafts harvested from right cheek  . Abdominal hysterectomy    . Tonsillectomy    . Colonoscopy  07-02-11    Dr. Deatra Ina, adenomatous polyp, no repeats are planned   . Band hemorrhoidectomy  last on 10-28-11    x 2, per Dr. Deatra Ina   . Flexible sigmoidoscopy  10/28/2011    Procedure: FLEXIBLE SIGMOIDOSCOPY;  Surgeon: Inda Castle, MD;  Location: [facility];  Service: Endoscopy;  Laterality: N/A;  . Gastric varices banding  10/28/2011    Procedure: HEMORRHOID BANDING;  Surgeon: Inda Castle, MD;  Location: [facility];  Service: Endoscopy;  Laterality: N/A;   Family  History  Problem Relation Age of Onset  . Colon polyps Mother   . Colon cancer Neg Hx   . Coronary artery disease      sister  . COPD      sister  . Immunodeficiency      sister  . Anemia      sister  . Anemia Sister   . Heart attack Neg Hx   . Stroke Father   . Hypertension Mother    Social History  Substance Use Topics  . Smoking status: Former Smoker    Quit date: 10/05/1994  . Smokeless tobacco: Never Used  . Alcohol Use: No   OB History    No data available     Review of Systems  Constitutional: Negative for appetite change.  Respiratory: Negative for shortness of breath.   Gastrointestinal: Negative for abdominal pain.  Genitourinary: Negative for flank pain.  Musculoskeletal: Positive for back pain. Negative for neck pain.  Skin: Positive for wound.  Neurological: Negative for weakness.  Hematological: Bruises/bleeds easily.      Allergies  Amlodipine; Amoxicillin;  Hydrocodone-acetaminophen; Nitrofurantoin; and Sulfonamide derivatives  Home Medications   Prior to Admission medications   Medication Sig Start Date End Date Taking? Authorizing Provider  allopurinol (ZYLOPRIM) 300 MG tablet TAKE 1 TABLET BY MOUTH EVERY DAY 11/25/15   Larey Dresser, MD  digoxin (LANOXIN) 0.125 MG tablet TAKE 1/2 TABLET BY MOUTH EVERY DAY 08/19/15   Larey Dresser, MD  diltiazem (CARDIZEM CD) 240 MG 24 hr capsule TAKE 1 CAPSULE (240 MG TOTAL) BY MOUTH DAILY. 08/19/15   Larey Dresser, MD  furosemide (LASIX) 20 MG tablet TAKE 1 TABLET BY MOUTH EVERY DAY 10/04/15   Larey Dresser, MD  KLOR-CON M10 10 MEQ tablet TAKE 1 TABLET (10 MEQ TOTAL) BY MOUTH 2 (TWO) TIMES DAILY. 07/24/15   Larey Dresser, MD  KLOR-CON M10 10 MEQ tablet TAKE 1 TABLET BY MOUTH TWICE A DAY 02/14/16   Larey Dresser, MD  levothyroxine (SYNTHROID, LEVOTHROID) 50 MCG tablet Take 1 tablet (50 mcg total) by mouth daily. 01/09/15   Laurey Morale, MD  metoprolol succinate (TOPROL-XL) 25 MG 24 hr tablet TAKE 1 TABLET (25 MG TOTAL) BY MOUTH EVERY EVENING. 11/13/15   Larey Dresser, MD  oxyCODONE-acetaminophen (PERCOCET/ROXICET) 5-325 MG per tablet Take 1 tablet by mouth every 4 (four) hours as needed for moderate pain or severe pain. 01/09/15   Laurey Morale, MD  pravastatin (PRAVACHOL) 10 MG tablet TAKE 1 TABLET BY MOUTH ON MON,WED,AND FRIDAYS 09/18/15   Larey Dresser, MD  warfarin (COUMADIN) 5 MG tablet Take 1 tablet (5 mg total) by mouth as directed. 11/01/15   Larey Dresser, MD   BP 114/70 mmHg  Pulse 74  Resp 20  SpO2 98% Physical Exam  Constitutional: She appears well-developed.  HENT:  Abrasion to left zygomatic area and left temporal area without underlying bony tenderness. Approximately 2 cm laceration on left side of forehead.  Eyes: Pupils are equal, round, and reactive to light.  Neck: Neck supple.  No midline cervical tenderness with painless range of motion.  Cardiovascular: Normal rate.    Pulmonary/Chest: Effort normal and breath sounds normal.  Abdominal: There is no tenderness.  Musculoskeletal: She exhibits no tenderness.  Neurological: She is alert.  Skin: Skin is warm.    ED Course  Procedures (including critical care time) Labs Review Labs Reviewed  PROTIME-INR - Abnormal; Notable for the following:  Prothrombin Time 26.0 (*)    INR 2.42 (*)    All other components within normal limits    Imaging Review Ct Head Wo Contrast  02/14/2016  CLINICAL DATA:  Fall, left-sided hematoma for forehead region. Headache. EXAM: CT HEAD WITHOUT CONTRAST TECHNIQUE: Contiguous axial images were obtained from the base of the skull through the vertex without intravenous contrast. COMPARISON:  CT 01/29/2014, 03/28/2007 and MRI 03/28/2007 FINDINGS: Ventricles, cisterns and remaining CSF spaces are unchanged as there is minimal age related atrophic change. Evidence of chronic ischemic microvascular disease. Old right MCA territory infarct. 9 mm focal hypodensity in the region of the left thalamus/ internal capsule compatible with previous lacunar infarct. No focal mass, mass effect, shift of midline structures or acute hemorrhage. No evidence to suggest acute infarction. Small left frontal scalp contusion. No evidence of fracture. IMPRESSION: No acute intracranial findings. Small left frontal scalp contusion. No fracture. Old right MCA territory infarct. Lacunar infarct over the left thalamus/ internal capsule. Mild atrophic change with chronic ischemic microvascular disease. Electronically Signed   By: Marin Olp M.D.   On: 02/14/2016 13:32   I have personally reviewed and evaluated these images and lab results as part of my medical decision-making.   EKG Interpretation None      MDM   Final diagnoses:  Fall, initial encounter  Forehead laceration, initial encounter    Patient presented after mechanical fall. Forehead laceration closed. Negative head CT. Will discharge  home.  LACERATION REPAIR Performed by: Mackie Pai Authorized by: Mackie Pai Consent: Verbal consent obtained. Risks and benefits: risks, benefits and alternatives were discussed Consent given by: patient Patient identity confirmed: provided demographic data Prepped and Draped in normal sterile fashion Wound explored  Laceration Location: forehead  Laceration Length: 2cm  No Foreign Bodies seen or palpated  Anesthesia: local infiltration  Local anesthetic: lidocaine 1%   Anesthetic total: 2 ml  Irrigation method: syringe Amount of cleaning: standard  Skin closure: 5-0 vicryl rapide  Number of sutures: 5  Technique: simple interrupted  Patient tolerance: Patient tolerated the procedure well with no immediate complications.      Davonna Belling, MD 02/14/16 1524

## 2016-02-19 ENCOUNTER — Encounter: Payer: Self-pay | Admitting: Family Medicine

## 2016-02-19 ENCOUNTER — Ambulatory Visit (INDEPENDENT_AMBULATORY_CARE_PROVIDER_SITE_OTHER): Payer: PPO | Admitting: Family Medicine

## 2016-02-19 VITALS — BP 112/94 | HR 104 | Temp 97.5°F | Ht 63.0 in | Wt 93.0 lb

## 2016-02-19 DIAGNOSIS — S0181XD Laceration without foreign body of other part of head, subsequent encounter: Secondary | ICD-10-CM | POA: Diagnosis not present

## 2016-02-19 NOTE — Progress Notes (Signed)
Pre visit review using our clinic review tool, if applicable. No additional management support is needed unless otherwise documented below in the visit note. 

## 2016-02-19 NOTE — Progress Notes (Signed)
   Subjective:    Patient ID: [name], female    DOB: 1926-05-25, 82 y.o.   MRN: [id number]  HPI Here to remove sutures from her forehead. While out shopping she stubbed her toe on a step and fel forward, striking her forehead on the pavement. She had a laceration to the forehead but fortunately no other injuries. She went to the ER where the wound was closed with sutures. She has done well since then and has no other complaints.    Review of Systems  Constitutional: Negative.   Respiratory: Negative.   Cardiovascular: Negative.   Skin: Positive for wound.  Neurological: Negative.        Objective:   Physical Exam  Constitutional: She is oriented to person, place, and time. She appears well-developed and well-nourished. No distress.  Walks with a 4 point cane   HENT:  Right Ear: External ear normal.  Left Ear: External ear normal.  Nose: Nose normal.  Mouth/Throat: Oropharynx is clear and moist.  Healing laceration on the left forehead with 5 sutures in place. Extensive ecchymoses over the forehead and the entire left side of the face   Eyes: Conjunctivae and EOM are normal. Pupils are equal, round, and reactive to light.  Neck: Neck supple.  Cardiovascular: Normal heart sounds and intact distal pulses.   No murmur heard. Rapid rate, irregular rhythm   Pulmonary/Chest: Effort normal and breath sounds normal.  Neurological: She is alert and oriented to person, place, and time. She exhibits normal muscle tone. Coordination normal.          Assessment & Plan:  Laceration after a fall, all sutures were removed. She is past due to follow up with Cardiology so I reminded her to make an appt with them soon . Laurey Morale, MD

## 2016-02-22 ENCOUNTER — Other Ambulatory Visit: Payer: Self-pay | Admitting: Cardiology

## 2016-02-24 NOTE — Telephone Encounter (Signed)
Please advise on request. Thanks, MI 

## 2016-02-24 NOTE — Telephone Encounter (Signed)
This prescription renewal would be fine.

## 2016-02-24 NOTE — Telephone Encounter (Signed)
I would ask her PCP to refill.

## 2016-02-27 ENCOUNTER — Other Ambulatory Visit: Payer: Self-pay | Admitting: Cardiology

## 2016-02-27 ENCOUNTER — Ambulatory Visit (INDEPENDENT_AMBULATORY_CARE_PROVIDER_SITE_OTHER): Payer: PPO | Admitting: *Deleted

## 2016-02-27 ENCOUNTER — Other Ambulatory Visit: Payer: Self-pay | Admitting: Family Medicine

## 2016-02-27 DIAGNOSIS — I4891 Unspecified atrial fibrillation: Secondary | ICD-10-CM | POA: Diagnosis not present

## 2016-02-27 DIAGNOSIS — I482 Chronic atrial fibrillation, unspecified: Secondary | ICD-10-CM

## 2016-02-27 DIAGNOSIS — Z5181 Encounter for therapeutic drug level monitoring: Secondary | ICD-10-CM

## 2016-02-27 LAB — POCT INR: INR: 2.3

## 2016-02-27 NOTE — Telephone Encounter (Signed)
Rx refill sent to pharmacy. 

## 2016-03-04 NOTE — Progress Notes (Signed)
Cardiology Office Note:    Date:  03/05/2016   ID:  [name], DOB 02-01-1926, MRN [id number]  PCP:  Christy Morale, MD  Cardiologist:  Dr. Loralie Key   Electrophysiologist:  n/a  Referring MD: Christy Morale, MD   Chief Complaint  Patient presents with  . Follow-up    CHF, AFib    History of Present Illness:     [name] is a 82 y.o. female with a hx of hx of chronic AF, sinus node dysfunction, chronic diastolic HF. She was admitted to the hospital in 4/15 with atrial fibrillation with RVR and acute on chronic diastolic CHF. She was diuresed and digoxin was restarted for rate control. Holter done after restarting digoxin showed average HR 88 (atrial fibrillation) with occasional pauses, longest was 2.9 seconds. Last seen in 10/16.  She fell 02/14/16.  Suffered forehead lac.  Head CT neg for bleed.    Here with her daughter, [name].  She is feeling better. Head lac has healed.  She denies further falling.  Denies syncope or near syncope.  She has some atypical chest pains from time to time.  Denies significant DOE.  No orthopnea, PND, edema.  No bleeding issues.     Past Medical History  Diagnosis Date  . Chronic atrial fibrillation (HCC)     a. permanent;  b. chronic coumadin;  c. 10/2013 digoxin and atenolol d/c'd 2/2 pauses of <3 sec;  d. 01/2014 digoxin resumed @ 0.125 mg in setting of difficult to control rate and diast chf.  . Hypertension     a. hx of edema with Amlodipine.  . Hyperlipidemia   . Contact dermatitis and other eczema, due to unspecified cause     a. h/o myalgias with atorvastatin.  . Frequent UTI     Dr. Terance Key sees pt  . Urticaria, unspecified   . Lumbago     chronic low back pain  . Gout, unspecified   . Hypothyroidism   . H/O: CVA (cardiovascular accident) 2008  . DJD (degenerative joint disease)   . Diverticulosis of colon (without mention of hemorrhage)   . Osteoporosis     las DEXA on 01/30/10  . History of melanoma     on  right ear  . Skin cancer     forehead  . Blood clotting disorder ([place])   . Chronic diastolic heart failure ([place])     a. Echo (6/08):  EF 55-60%, mild MR;  b. Echo (10/2013):  EF 55%, mild AS  . History of CVA (cerebrovascular accident)     2008  . Bradycardia     a. Holter (1/15):  avg HR 78, 1.4% PVCs, several pauses </= 3 sec, b.  Holter (01/2014):  AFib, freq PVCs, HR 51-127, Avg HR 88, longest pause 2.9 sec - no changes made  . Aortic stenosis     a. mild on echo 10/2013  . Carotid stenosis     a. Carotid US (4/14):  RICA 40-59%;  b. Carotid US (01/2014) bilateral 1-39%  1. Chronic atrial fibrillation 2. Gout 3. Diastolic CHF: Echo (123XX123) with EF 55-60%, mild MR. Echo (1/15) with EF 55%, probably at least mild AS.  4. HTN: Edema with amlodipine . 5. Hyperlipidemia: myalgias with atorvastatin 6. H/o UTIs  7. Chronic low back pain.  8. Hypothyroidism 9. CVA in 2008.  10. OA 11. Diverticulosis 12. Osteoporosis 13. H/o melanoma 14. Carotid stenosis: Dopplers (4/14) with 40-59% RICA stenosis (has been stable).  15.  Bradycardia: Holter (1/15) with average HR 78, 1.4% PVCs, several pauses </= 3 seconds. Holter (4/15) with atrial fibrillation, average HR 88, frequent PVCs, longest pause 2.9 sec 16. Aortic stenosis: At least mild on 1/15 echo.   Past Surgical History  Procedure Laterality Date  . Skin cancer excision  2001    removed right ear per Dr. Denna Key, skin grafts harvested from right cheek  . Abdominal hysterectomy    . Tonsillectomy    . Colonoscopy  07-02-11    Dr. Deatra Key, adenomatous polyp, no repeats are planned   . Band hemorrhoidectomy  last on 10-28-11    x 2, per Dr. Deatra Key   . Flexible sigmoidoscopy  10/28/2011    Procedure: FLEXIBLE SIGMOIDOSCOPY;  Surgeon: Christy Castle, MD;  Location: [facility];  Service: Endoscopy;  Laterality: N/A;  . Gastric varices banding  10/28/2011    Procedure: HEMORRHOID BANDING;  Surgeon: Christy Castle, MD;  Location: [facility];  Service: Endoscopy;  Laterality: N/A;    Current Medications: Outpatient Prescriptions Prior to Visit  Medication Sig Dispense Refill  . allopurinol (ZYLOPRIM) 300 MG tablet TAKE 1 TABLET BY MOUTH EVERY DAY 90 tablet 0  . digoxin (LANOXIN) 0.125 MG tablet TAKE 1/2 TABLET BY MOUTH EVERY DAY 45 tablet 3  . diltiazem (CARDIZEM CD) 240 MG 24 hr capsule TAKE 1 CAPSULE (240 MG TOTAL) BY MOUTH DAILY. 90 capsule 3  . KLOR-CON M10 10 MEQ tablet TAKE 1 TABLET (10 MEQ TOTAL) BY MOUTH 2 (TWO) TIMES DAILY. 60 tablet 0  . levothyroxine (SYNTHROID, LEVOTHROID) 50 MCG tablet TAKE 1 TABLET BY MOUTH EVERY DAY 90 tablet 1  . metoprolol succinate (TOPROL-XL) 25 MG 24 hr tablet TAKE 1 TABLET (25 MG TOTAL) BY MOUTH EVERY EVENING. 90 tablet 2  . oxyCODONE-acetaminophen (PERCOCET/ROXICET) 5-325 MG per tablet Take 1 tablet by mouth every 4 (four) hours as needed for moderate pain or severe pain. 240 tablet 0  . pravastatin (PRAVACHOL) 10 MG tablet TAKE 1 TABLET BY MOUTH ON MON,WED,AND FRIDAYS 45 tablet 2  . warfarin (COUMADIN) 5 MG tablet Take 1 tablet (5 mg total) by mouth as directed. 270 tablet 1  . furosemide (LASIX) 20 MG tablet TAKE 1 TABLET BY MOUTH EVERY DAY 30 tablet 4   No facility-administered medications prior to visit.      Allergies:   Amlodipine; Amoxicillin; Hydrocodone-acetaminophen; Nitrofurantoin; and Sulfonamide derivatives   Social History   Social History  . Marital Status: Widowed    Spouse Name: N/A  . Number of Children: 3  . Years of Education: N/A   Occupational History  . retired    Social History Main Topics  . Smoking status: Former Smoker    Quit date: 10/05/1994  . Smokeless tobacco: Never Used  . Alcohol Use: No  . Drug Use: No  . Sexual Activity: No   Other Topics Concern  . None   Social History Narrative     Family History:  The patient's family history includes Anemia in her sister; Colon polyps in her mother; Hypertension in her mother; Stroke  in her father. There is no history of Colon cancer or Heart attack.   ROS:   Please see the history of present illness.    ROS All other systems reviewed and are negative.   Physical Exam:   VS: BP 122/58 mmHg  Pulse 118  Ht 5\' 3"  (1.6 m)  Wt 91 lb (41.277 kg)  BMI 16.12 kg/m2   Physical Exam  Constitutional:  She is oriented to person, place, and time. She appears well-developed and well-nourished. No distress.  HENT:  Head: Normocephalic and atraumatic.  Neck: Normal range of motion. No JVD present.  Cardiovascular: Normal rate and regular rhythm.  Exam reveals no gallop.   No murmur heard. Pulmonary/Chest: Breath sounds normal. She has no wheezes. She has no rales.  Abdominal: Soft. Bowel sounds are normal.  Musculoskeletal: Normal range of motion.  Neurological: She is alert and oriented to person, place, and time.  Skin: Skin is warm and dry.  Psychiatric: She has a normal mood and affect.       Wt Readings from Last 3 Encounters:  03/05/16 91 lb (41.277 kg)  02/19/16 93 lb (42.185 kg)  07/31/15 96 lb 1.9 oz (43.6 kg)      Studies/Labs Reviewed:     EKG:  EKG is ordered today.  The ekg ordered today demonstrates AFib, HR 118, LAD, PVC vs aberrancy  Recent Labs: 07/31/2015: BUN 10; Creat 0.69; Hemoglobin 12.3; Platelets 245; Potassium 4.3; Sodium 136   Recent Lipid Panel    Component Value Date/Time   CHOL 174 01/09/2015 1110   TRIG 99.0 01/09/2015 1110   HDL 41.70 01/09/2015 1110   CHOLHDL 4 01/09/2015 1110   VLDL 19.8 01/09/2015 1110   LDLCALC 113* 01/09/2015 1110   LDLDIRECT 141.5 08/06/2010 0944    Additional studies/ records that were reviewed today include:   Head CT 02/14/16 IMPRESSION: No acute intracranial findings. Small left frontal scalp contusion. No fracture. Old right MCA territory infarct. Lacunar infarct over the left thalamus/ internal capsule. Mild atrophic change with chronic ischemic microvascular disease.  Carotid US 4/16 Bilateral  1-39% ICA  Holter 4/15 Persistent AFib, Freq PVCs, HR 51-127, avg HR 88, longest pause 2.9 sec  Echo 1/15 EF 55%, no RWMA, probably mild AS, mild AI, mild MR, mild LAE  ASSESSMENT:     1. Chronic atrial fibrillation ([place])   2. Chronic diastolic heart failure ([place])   3. Bilateral carotid artery disease ([place])   4. Hyperlipidemia   5. Essential hypertension   6. Bradycardia     PLAN:     In order of problems listed above:  1. Chronic atrial fibrillation: She has been maintained on Coumadin for anticoagulation.CHADS2-VASc= 7 (female, age > 75, HTN, carotid disease, CVA).  She had a recent fall but denies syncope.  Head CT was neg for bleeding. At this time, will continue coumadin.  She would be high risk for CVA without anticoagulation.  I have encouraged her to use her walker and avoid further falling.  Her HR is elevated.  She notes significant caffeine intake.  Before increasing her rate controlling Rx, I would like her to decrease caffeine.  In several weeks, get 24 hour Holter to assess HR.  If still > 110, increase Toprol-XL.  Get Dig level, BMET, CBC at time of monitor.    2. Chronic Diastolic CHF:  Volume stable.  She has difficulty sleeping due to frequent urination.  She is due to see urology.  Will have her decrease Lasix to every other day.  If weight increases >/= 2 lbs in 1 day, she will need to resume Lasix QD.  3. Carotid artery disease - Stable for many years. No follow-up unless she develops symptoms.  4. Hyperlipidemia: Continue pravastatin 3 times a week.  5. HTN: Controlled.  6. Bradycardia: Previous Holter has demonstrated < 3 second pauses. She denies syncope or near-syncope.  Arrange Holter as above for  rate control.     Medication Adjustments/Labs and Tests Ordered: Current medicines are reviewed at length with the patient today.  Concerns regarding medicines are outlined above.  Medication changes, Labs and Tests ordered today are outlined in the Patient  Instructions noted below. Patient Instructions  Medication Instructions:  1. CHANGE LASIX TO EVERY OTHER DAY Labwork: BMET, CBC W/DIFF, DIGOXIN LEVEL; THESE ARE TO BE DONE THE DAY YOU COME IN FOR YOUR MONITOR IN 2-3 WEEKS; YOU WILL NEED TO MAKE SURE TO HOLD YOUR DIGOXIN THE MORNING OF THE LAB WORK Testing/Procedures: Your physician has recommended that you wear a 24 HOUR holter monitor. Holter monitors are medical devices that record the heart's electrical activity. Doctors most often use these monitors to diagnose arrhythmias. Arrhythmias are problems with the speed or rhythm of the heartbeat. The monitor is a small, portable device. You can wear one while you do your normal daily activities. This is usually used to diagnose what is causing palpitations/syncope (passing out).  THIS IS TO BE DONE IN 2-3 WEEKS  Follow-Up: Octave Montrose, PAC 3-4 MONTHS SAME DAY DR. Aundra Dubin IS IN THE OFFICE IF POSSIBLE Any Other Special Instructions Will Be Listed Below (If Applicable). WEIGH DAILY AND IF YOUR WEIGHT INCREASES BY 2 LB'S OR MORE IN 1 DAY THEN GO BACK TO LASIX EVERY DAY DECREASE CAFFEINE TO NO MORE THAN 1-2 CUPS DAILY If you need a refill on your cardiac medications before your next appointment, please call your pharmacy.   Signed, [name], PA-C  03/05/2016 12:34 PM    [facility] [address], [place], [place]  [address] Phone: [phone]; Fax: [phone]

## 2016-03-05 ENCOUNTER — Ambulatory Visit (INDEPENDENT_AMBULATORY_CARE_PROVIDER_SITE_OTHER): Payer: PPO | Admitting: Physician Assistant

## 2016-03-05 ENCOUNTER — Encounter: Payer: Self-pay | Admitting: Physician Assistant

## 2016-03-05 VITALS — BP 122/58 | HR 118 | Ht 63.0 in | Wt 91.0 lb

## 2016-03-05 DIAGNOSIS — I10 Essential (primary) hypertension: Secondary | ICD-10-CM

## 2016-03-05 DIAGNOSIS — I5032 Chronic diastolic (congestive) heart failure: Secondary | ICD-10-CM

## 2016-03-05 DIAGNOSIS — I482 Chronic atrial fibrillation, unspecified: Secondary | ICD-10-CM

## 2016-03-05 DIAGNOSIS — E785 Hyperlipidemia, unspecified: Secondary | ICD-10-CM

## 2016-03-05 DIAGNOSIS — I779 Disorder of arteries and arterioles, unspecified: Secondary | ICD-10-CM | POA: Diagnosis not present

## 2016-03-05 DIAGNOSIS — R001 Bradycardia, unspecified: Secondary | ICD-10-CM

## 2016-03-05 DIAGNOSIS — I739 Peripheral vascular disease, unspecified: Secondary | ICD-10-CM

## 2016-03-05 NOTE — Patient Instructions (Addendum)
Medication Instructions:  1. CHANGE LASIX TO EVERY OTHER DAY Labwork: BMET, CBC W/DIFF, DIGOXIN LEVEL; THESE ARE TO BE DONE THE DAY YOU COME IN FOR YOUR MONITOR IN 2-3 WEEKS; YOU WILL NEED TO MAKE SURE TO HOLD YOUR DIGOXIN THE MORNING OF THE LAB WORK Testing/Procedures: Your physician has recommended that you wear a 24 HOUR holter monitor. Holter monitors are medical devices that record the heart's electrical activity. Doctors most often use these monitors to diagnose arrhythmias. Arrhythmias are problems with the speed or rhythm of the heartbeat. The monitor is a small, portable device. You can wear one while you do your normal daily activities. This is usually used to diagnose what is causing palpitations/syncope (passing out).  THIS IS TO BE DONE IN 2-3 WEEKS  Follow-Up: SCOTT WEAVER, PAC 3-4 MONTHS SAME DAY DR. Aundra Dubin IS IN THE OFFICE IF POSSIBLE Any Other Special Instructions Will Be Listed Below (If Applicable). WEIGH DAILY AND IF YOUR WEIGHT INCREASES BY 2 LB'S OR MORE IN 1 DAY THEN GO BACK TO LASIX EVERY DAY DECREASE CAFFEINE TO NO MORE THAN 1-2 CUPS DAILY If you need a refill on your cardiac medications before your next appointment, please call your pharmacy.

## 2016-03-17 LAB — HM MAMMOGRAPHY

## 2016-03-19 ENCOUNTER — Ambulatory Visit (INDEPENDENT_AMBULATORY_CARE_PROVIDER_SITE_OTHER): Payer: PPO

## 2016-03-19 ENCOUNTER — Other Ambulatory Visit (INDEPENDENT_AMBULATORY_CARE_PROVIDER_SITE_OTHER): Payer: PPO | Admitting: *Deleted

## 2016-03-19 DIAGNOSIS — I482 Chronic atrial fibrillation, unspecified: Secondary | ICD-10-CM

## 2016-03-19 LAB — CBC WITH DIFFERENTIAL/PLATELET
BASOS ABS: 67 {cells}/uL (ref 0–200)
Basophils Relative: 1 %
EOS ABS: 268 {cells}/uL (ref 15–500)
Eosinophils Relative: 4 %
HEMATOCRIT: 36.5 % (ref 35.0–45.0)
HEMOGLOBIN: 11.8 g/dL (ref 11.7–15.5)
LYMPHS ABS: 2613 {cells}/uL (ref 850–3900)
LYMPHS PCT: 39 %
MCH: 31.8 pg (ref 27.0–33.0)
MCHC: 32.3 g/dL (ref 32.0–36.0)
MCV: 98.4 fL (ref 80.0–100.0)
MONO ABS: 469 {cells}/uL (ref 200–950)
MPV: 9.2 fL (ref 7.5–12.5)
Monocytes Relative: 7 %
NEUTROS PCT: 49 %
Neutro Abs: 3283 cells/uL (ref 1500–7800)
Platelets: 278 10*3/uL (ref 140–400)
RBC: 3.71 MIL/uL — ABNORMAL LOW (ref 3.80–5.10)
RDW: 14.3 % (ref 11.0–15.0)
WBC: 6.7 10*3/uL (ref 3.8–10.8)

## 2016-03-20 ENCOUNTER — Other Ambulatory Visit: Payer: Self-pay | Admitting: Cardiology

## 2016-03-20 LAB — DIGOXIN LEVEL: Digoxin Level: 0.5 ug/L — ABNORMAL LOW (ref 0.8–2.0)

## 2016-03-20 LAB — BASIC METABOLIC PANEL
BUN: 11 mg/dL (ref 7–25)
CHLORIDE: 104 mmol/L (ref 98–110)
CO2: 23 mmol/L (ref 20–31)
Calcium: 9.3 mg/dL (ref 8.6–10.4)
Creat: 0.76 mg/dL (ref 0.60–0.88)
GLUCOSE: 116 mg/dL — AB (ref 65–99)
POTASSIUM: 4.2 mmol/L (ref 3.5–5.3)
Sodium: 136 mmol/L (ref 135–146)

## 2016-03-25 ENCOUNTER — Ambulatory Visit (INDEPENDENT_AMBULATORY_CARE_PROVIDER_SITE_OTHER): Payer: PPO | Admitting: *Deleted

## 2016-03-25 DIAGNOSIS — I482 Chronic atrial fibrillation, unspecified: Secondary | ICD-10-CM

## 2016-03-25 DIAGNOSIS — I4891 Unspecified atrial fibrillation: Secondary | ICD-10-CM

## 2016-03-25 DIAGNOSIS — Z5181 Encounter for therapeutic drug level monitoring: Secondary | ICD-10-CM | POA: Diagnosis not present

## 2016-03-25 LAB — POCT INR: INR: 2.1

## 2016-03-31 ENCOUNTER — Telehealth: Payer: Self-pay | Admitting: Cardiology

## 2016-03-31 MED ORDER — METOPROLOL SUCCINATE ER 50 MG PO TB24: 50.0000 mg | ORAL_TABLET | Freq: Every day | ORAL | Status: DC

## 2016-03-31 NOTE — Telephone Encounter (Signed)
F/u  Pt returning RN phone call- monitor results. Please call back and discuss.

## 2016-03-31 NOTE — Telephone Encounter (Signed)
Informed pt of monitor results. Pt agreeable to increase Toprol XL to 50mg  QD. Advised pt to call us if any issues with medication change. Pt verbalized understanding and was in agreement with this plan.

## 2016-04-01 ENCOUNTER — Encounter: Payer: Self-pay | Admitting: Physician Assistant

## 2016-04-16 DIAGNOSIS — Z1231 Encounter for screening mammogram for malignant neoplasm of breast: Secondary | ICD-10-CM | POA: Diagnosis not present

## 2016-04-17 ENCOUNTER — Encounter: Payer: Self-pay | Admitting: Family Medicine

## 2016-04-22 ENCOUNTER — Ambulatory Visit (INDEPENDENT_AMBULATORY_CARE_PROVIDER_SITE_OTHER): Payer: PPO | Admitting: *Deleted

## 2016-04-22 DIAGNOSIS — I4891 Unspecified atrial fibrillation: Secondary | ICD-10-CM | POA: Diagnosis not present

## 2016-04-22 DIAGNOSIS — I482 Chronic atrial fibrillation, unspecified: Secondary | ICD-10-CM

## 2016-04-22 DIAGNOSIS — Z5181 Encounter for therapeutic drug level monitoring: Secondary | ICD-10-CM | POA: Diagnosis not present

## 2016-04-22 LAB — POCT INR: INR: 2.5

## 2016-04-24 ENCOUNTER — Encounter: Payer: Self-pay | Admitting: Family Medicine

## 2016-05-29 ENCOUNTER — Encounter: Payer: Self-pay | Admitting: Physician Assistant

## 2016-05-29 ENCOUNTER — Other Ambulatory Visit: Payer: Self-pay | Admitting: Cardiology

## 2016-05-29 NOTE — Telephone Encounter (Signed)
Ok to refill 

## 2016-06-01 NOTE — Telephone Encounter (Signed)
Dr Aundra Dubin usually doesn't prescribe this. I would suggest her PCP refill.

## 2016-06-03 ENCOUNTER — Ambulatory Visit (INDEPENDENT_AMBULATORY_CARE_PROVIDER_SITE_OTHER): Payer: PPO | Admitting: *Deleted

## 2016-06-03 DIAGNOSIS — I4891 Unspecified atrial fibrillation: Secondary | ICD-10-CM | POA: Diagnosis not present

## 2016-06-03 DIAGNOSIS — I482 Chronic atrial fibrillation, unspecified: Secondary | ICD-10-CM

## 2016-06-03 DIAGNOSIS — Z5181 Encounter for therapeutic drug level monitoring: Secondary | ICD-10-CM | POA: Diagnosis not present

## 2016-06-03 LAB — POCT INR: INR: 2.3

## 2016-06-09 ENCOUNTER — Other Ambulatory Visit: Payer: Self-pay | Admitting: Cardiology

## 2016-06-14 NOTE — Progress Notes (Signed)
Cardiology Office Note:    Date:  06/15/2016   ID:  [name], DOB May 22, 1926, MRN [id number]  PCP:  Laurey Morale, MD  Cardiologist:  Dr. [name]   Electrophysiologist:  n/a  Referring MD: Laurey Morale, MD   Chief Complaint  Patient presents with  . Follow-up    afib   History of Present Illness:    [name] is a 82 y.o. female with a hx of chronic AF, sinus node dysfunction, chronic diastolic HF. She was admitted to the hospital in 4/15 with atrial fibrillation with RVR and acute on chronic diastolic CHF. She was diuresed and digoxin was restarted for rate control. Holter done after restarting digoxin showed average HR 88 (atrial fibrillation) with occasional pauses, longest was 2.9 seconds.  Last seen in 6/17.  Her HR was elevated and I had her do a Holter.  She did have an elevated HR up to 140, but her avg HR was ok at 89.  Dr. Aundra Dubin did increase her Metoprolol Succinate.  Returns for FU. Here with her daughter, [name].  She is doing well.  Denies chest pain or significant dyspnea.  She denies orthopnea, PND, edema. She denies bleeding issues.  She continues to have a poor appetite.  She denies rapid palpitations.    Prior CV studies that were reviewed today include:    Holter 6/17 Sustained atrial fibrillation with some PVCs, HR up to 140 but average rate 89.   Head CT 02/14/16 IMPRESSION: No acute intracranial findings. Small left frontal scalp contusion. No fracture. Old right MCA territory infarct. Lacunar infarct over the left thalamus/ internal capsule. Mild atrophic change with chronic ischemic microvascular disease.  Carotid US 4/16 Bilateral 1-39% ICA  Holter 4/15 Persistent AFib, Freq PVCs, HR 51-127, avg HR 88, longest pause 2.9 sec  Echo 1/15 EF 55%, no RWMA, probably mild AS, mild AI, mild MR, mild LAE  Past Medical History:  Diagnosis Date  . Aortic stenosis    a. mild on echo 10/2013  . Blood clotting disorder ([place])   .  Bradycardia    a. Holter (1/15):  avg HR 78, 1.4% PVCs, several pauses </= 3 sec, b.  Holter (01/2014):  AFib, freq PVCs, HR 51-127, Avg HR 88, longest pause 2.9 sec - no changes made  . Carotid stenosis    a. Carotid US (4/14):  RICA 40-59%;  b. Carotid US (01/2014) bilateral 1-39%  . Chronic atrial fibrillation ([facility])    a. permanent;  b. chronic coumadin;  c. 10/2013 digoxin and atenolol d/c'd 2/2 pauses of <3 sec;  d. 01/2014 digoxin resumed @ 0.125 mg in setting of difficult to control rate and diast chf.  . Chronic diastolic heart failure ([place])    a. Echo (6/08):  EF 55-60%, mild MR;  b. Echo (10/2013):  EF 55%, mild AS  . Contact dermatitis and other eczema, due to unspecified cause    a. h/o myalgias with atorvastatin.  . Diverticulosis of colon (without mention of hemorrhage)   . DJD (degenerative joint disease)   . Frequent UTI    Dr. Terance Hart sees pt  . Gout, unspecified   . H/O: CVA (cardiovascular accident) 2008  . History of CVA (cerebrovascular accident)    2008  . History of melanoma    on right ear  . Hyperlipidemia   . Hypertension    a. hx of edema with Amlodipine.  . Hypothyroidism   . Lumbago    chronic low back  pain  . Osteoporosis    las DEXA on 01/30/10  . Skin cancer    forehead  . Urticaria, unspecified   1. Chronic atrial fibrillation 2. Gout 3. Diastolic CHF: Echo (123XX123) with EF 55-60%, mild MR. Echo (1/15) with EF 55%, probably at least mild AS.  4. HTN: Edema with amlodipine . 5. Hyperlipidemia: myalgias with atorvastatin 6. H/o UTIs  7. Chronic low back pain.  8. Hypothyroidism 9. CVA in 2008.  10. OA 11. Diverticulosis 12. Osteoporosis 13. H/o melanoma 14. Carotid stenosis: Dopplers (4/14) with 40-59% RICA stenosis (has been stable).  15. Bradycardia: Holter (1/15) with average HR 78, 1.4% PVCs, several pauses </= 3 seconds. Holter (4/15) with atrial fibrillation, average HR 88, frequent PVCs, longest pause 2.9 sec 16. Aortic stenosis: At  least mild on 1/15 echo.   Past Surgical History:  Procedure Laterality Date  . ABDOMINAL HYSTERECTOMY    . BAND HEMORRHOIDECTOMY  last on 10-28-11   x 2, per Dr. Deatra Ina   . COLONOSCOPY  07-02-11   Dr. Deatra Ina, adenomatous polyp, no repeats are planned   . FLEXIBLE SIGMOIDOSCOPY  10/28/2011   Procedure: FLEXIBLE SIGMOIDOSCOPY;  Surgeon: Inda Castle, MD;  Location: [facility];  Service: Endoscopy;  Laterality: N/A;  . GASTRIC VARICES BANDING  10/28/2011   Procedure: HEMORRHOID BANDING;  Surgeon: Inda Castle, MD;  Location: [facility];  Service: Endoscopy;  Laterality: N/A;  . SKIN CANCER EXCISION  2001   removed right ear per Dr. Denna Haggard, skin grafts harvested from right cheek  . TONSILLECTOMY      Current Medications: Outpatient Medications Prior to Visit  Medication Sig Dispense Refill  . allopurinol (ZYLOPRIM) 300 MG tablet TAKE 1 TABLET BY MOUTH EVERY DAY 90 tablet 0  . digoxin (LANOXIN) 0.125 MG tablet TAKE 1/2 TABLET BY MOUTH EVERY DAY 45 tablet 3  . diltiazem (CARDIZEM CD) 240 MG 24 hr capsule TAKE 1 CAPSULE (240 MG TOTAL) BY MOUTH DAILY. 90 capsule 3  . furosemide (LASIX) 20 MG tablet Take 1 tablet (20 mg total) by mouth every other day.    [name] KLOR-CON M10 10 MEQ tablet TAKE 1 TABLET (10 MEQ TOTAL) BY MOUTH 2 (TWO) TIMES DAILY. 60 tablet 0  . levothyroxine (SYNTHROID, LEVOTHROID) 50 MCG tablet TAKE 1 TABLET BY MOUTH EVERY DAY 90 tablet 1  . oxyCODONE-acetaminophen (PERCOCET/ROXICET) 5-325 MG per tablet Take 1 tablet by mouth every 4 (four) hours as needed for moderate pain or severe pain. 240 tablet 0  . pravastatin (PRAVACHOL) 10 MG tablet TAKE 1 TABLET BY MOUTH ON MON,WED,AND FRIDAYS 45 tablet 1  . warfarin (COUMADIN) 5 MG tablet Take 1 tablet (5 mg total) by mouth as directed. 270 tablet 1  . metoprolol succinate (TOPROL-XL) 50 MG 24 hr tablet Take 1 tablet (50 mg total) by mouth daily. Take with or immediately following a meal. 90 tablet 3  . furosemide (LASIX) 20 MG  tablet Take 1 tablet (20 mg total) by mouth every other day. IF YOUR WEIGHT INCREASES BY 2 LB'S OR MORE IN 1 DAY THEN GO BACK TO 20 MG EVERY DAY 30 tablet 11   No facility-administered medications prior to visit.       Allergies:   Amlodipine; Amoxicillin; Hydrocodone-acetaminophen; Nitrofurantoin; and Sulfonamide derivatives   Social History   Social History  . Marital status: Widowed    Spouse name: N/A  . Number of children: 3  . Years of education: N/A   Occupational History  . retired Retired  Social History Main Topics  . Smoking status: Former Smoker    Quit date: 10/05/1994  . Smokeless tobacco: Never Used  . Alcohol use No  . Drug use: No  . Sexual activity: No   Other Topics Concern  . None   Social History Narrative  . None     Family History:  The patient's family history includes Anemia in her sister; Colon polyps in her mother; Hypertension in her mother; Stroke in her father.   ROS:   Please see the history of present illness.    ROS All other systems reviewed and are negative.   EKGs/Labs/Other Test Reviewed:    EKG:  EKG is  ordered today.  The ekg ordered today demonstrates AFib, HR 114, PVC vs aberrantly conducted beat  Recent Labs: 03/19/2016: BUN 11; Creat 0.76; Hemoglobin 11.8; Platelets 278; Potassium 4.2; Sodium 136   Recent Lipid Panel    Component Value Date/Time   CHOL 174 01/09/2015 1110   TRIG 99.0 01/09/2015 1110   HDL 41.70 01/09/2015 1110   CHOLHDL 4 01/09/2015 1110   VLDL 19.8 01/09/2015 1110   LDLCALC 113 (H) 01/09/2015 1110   LDLDIRECT 141.5 08/06/2010 0944     Physical Exam:    VS:  BP 136/84   Pulse (!) 114   Ht 5\' 3"  (1.6 m)   Wt 90 lb 12.8 oz (41.2 kg)   BMI 16.08 kg/m     Wt Readings from Last 3 Encounters:  06/15/16 90 lb 12.8 oz (41.2 kg)  03/05/16 91 lb (41.3 kg)  02/19/16 93 lb (42.2 kg)     Physical Exam  Constitutional: She is oriented to person, place, and time. She appears well-developed and  well-nourished. No distress.  HENT:  Head: Normocephalic and atraumatic.  Eyes: No scleral icterus.  Neck: No JVD present.  Cardiovascular: S1 normal and S2 normal.  An irregularly irregular rhythm present. Tachycardia present.   No murmur heard. Pulmonary/Chest: Effort normal. She has no wheezes. She has no rales.  Abdominal: Soft. There is no tenderness.  Musculoskeletal: She exhibits no edema.  Neurological: She is alert and oriented to person, place, and time.  Skin: Skin is warm and dry.  Psychiatric: She has a normal mood and affect.    ASSESSMENT:    1. Chronic atrial fibrillation ([place])   2. Chronic diastolic heart failure ([place])   3. Bilateral carotid artery disease ([place])   4. Essential hypertension   5. Hyperlipidemia    PLAN:    In order of problems listed above:  1. Chronic atrial fibrillation: She has been maintained on Coumadin for anticoagulation.CHADS2-VASc= 7 (female, age > 38, HTN, carotid disease, CVA).  24 hour Holter in 6/17 demonstrated well controlled HR.  There was a HR up into the 140s and Dr. Aundra Dubin increased her Metoprolol Succinate.  Her HR remains high. She is not symptomatic.  Her BP can certainly tolerate increasing her beta blocker.    -  Continue Coumadin  -  Increase Toprol to 50 mg QAM and 25 mg QPM  2. Chronic Diastolic CHF:  Volume stable.  Continue current Rx.   3. Carotid artery disease - Stable for many years. No follow-up unless she develops symptoms.  4. Hyperlipidemia: Continue pravastatin 3 times a week.  5. HTN: Controlled.   Medication Adjustments/Labs and Tests Ordered: Current medicines are reviewed at length with the patient today.  Concerns regarding medicines are outlined above.  Medication changes, Labs and Tests ordered today are outlined in  the Patient Instructions noted below. Patient Instructions  Medication Instructions:  1. INCREASE TOPROL XL TO 50 MG IN THE MORNING AND 25 MG IN THE PM; NEW RX SENT IN  TODAY Labwork: NONE Testing/Procedures: NONE Follow-Up: Your physician wants you to follow-up in: [place], [place] . You will receive a reminder letter in the mail two months in advance. If you don't receive a letter, please call our office to schedule the follow-up appointment. Any Other Special Instructions Will Be Listed Below (If Applicable). If you need a refill on your cardiac medications before your next appointment, please call your pharmacy.   Signed, [name], PA-C  06/15/2016 2:36 PM    [facility] [place], [place], [place]  [address] Phone: [phone]; Fax: [phone]

## 2016-06-15 ENCOUNTER — Encounter: Payer: Self-pay | Admitting: Physician Assistant

## 2016-06-15 ENCOUNTER — Ambulatory Visit (INDEPENDENT_AMBULATORY_CARE_PROVIDER_SITE_OTHER): Payer: PPO | Admitting: Physician Assistant

## 2016-06-15 VITALS — BP 136/84 | HR 114 | Ht 63.0 in | Wt 90.8 lb

## 2016-06-15 DIAGNOSIS — I779 Disorder of arteries and arterioles, unspecified: Secondary | ICD-10-CM

## 2016-06-15 DIAGNOSIS — I482 Chronic atrial fibrillation, unspecified: Secondary | ICD-10-CM

## 2016-06-15 DIAGNOSIS — E785 Hyperlipidemia, unspecified: Secondary | ICD-10-CM

## 2016-06-15 DIAGNOSIS — I739 Peripheral vascular disease, unspecified: Secondary | ICD-10-CM

## 2016-06-15 DIAGNOSIS — I10 Essential (primary) hypertension: Secondary | ICD-10-CM | POA: Diagnosis not present

## 2016-06-15 DIAGNOSIS — I5032 Chronic diastolic (congestive) heart failure: Secondary | ICD-10-CM

## 2016-06-15 MED ORDER — METOPROLOL SUCCINATE ER 50 MG PO TB24: 50.0000 mg | ORAL_TABLET | ORAL | 11 refills | Status: DC

## 2016-06-15 NOTE — Patient Instructions (Addendum)
Medication Instructions:  1. INCREASE TOPROL XL TO 50 MG IN THE MORNING AND 25 MG IN THE PM; NEW RX SENT IN TODAY Labwork: NONE Testing/Procedures: NONE Follow-Up: Your physician wants you to follow-up in: [place], [place] . You will receive a reminder letter in the mail two months in advance. If you don't receive a letter, please call our office to schedule the follow-up appointment. Any Other Special Instructions Will Be Listed Below (If Applicable). If you need a refill on your cardiac medications before your next appointment, please call your pharmacy.

## 2016-07-07 ENCOUNTER — Ambulatory Visit (INDEPENDENT_AMBULATORY_CARE_PROVIDER_SITE_OTHER): Payer: PPO

## 2016-07-07 DIAGNOSIS — Z23 Encounter for immunization: Secondary | ICD-10-CM

## 2016-07-15 ENCOUNTER — Ambulatory Visit (INDEPENDENT_AMBULATORY_CARE_PROVIDER_SITE_OTHER): Payer: PPO | Admitting: *Deleted

## 2016-07-15 DIAGNOSIS — Z5181 Encounter for therapeutic drug level monitoring: Secondary | ICD-10-CM | POA: Diagnosis not present

## 2016-07-15 DIAGNOSIS — I482 Chronic atrial fibrillation, unspecified: Secondary | ICD-10-CM

## 2016-07-15 DIAGNOSIS — I4891 Unspecified atrial fibrillation: Secondary | ICD-10-CM | POA: Diagnosis not present

## 2016-07-15 LAB — POCT INR: INR: 2.2

## 2016-08-03 ENCOUNTER — Ambulatory Visit: Payer: Self-pay | Admitting: Family Medicine

## 2016-08-04 ENCOUNTER — Ambulatory Visit: Payer: Self-pay | Admitting: Family Medicine

## 2016-08-04 ENCOUNTER — Ambulatory Visit (INDEPENDENT_AMBULATORY_CARE_PROVIDER_SITE_OTHER): Payer: PPO | Admitting: Family Medicine

## 2016-08-04 ENCOUNTER — Encounter: Payer: Self-pay | Admitting: Family Medicine

## 2016-08-04 VITALS — BP 120/68 | Temp 97.6°F | Ht 63.0 in | Wt 91.0 lb

## 2016-08-04 DIAGNOSIS — R350 Frequency of micturition: Secondary | ICD-10-CM | POA: Diagnosis not present

## 2016-08-04 LAB — POC URINALSYSI DIPSTICK (AUTOMATED)
BILIRUBIN UA: NEGATIVE
GLUCOSE UA: NEGATIVE
Ketones, UA: NEGATIVE
Nitrite, UA: POSITIVE
Protein, UA: NEGATIVE
SPEC GRAV UA: 1.01
UROBILINOGEN UA: 0.2
pH, UA: 6

## 2016-08-04 NOTE — Addendum Note (Signed)
Addended by: Gari Crown D on: 08/04/2016 02:02 PM   Modules accepted: Orders

## 2016-08-04 NOTE — Progress Notes (Signed)
Pre visit review using our clinic review tool, if applicable. No additional management support is needed unless otherwise documented below in the visit note. 

## 2016-08-04 NOTE — Progress Notes (Signed)
   Subjective:    Patient ID: [name], female    DOB: 1926/01/08, 82 y.o.   MRN: [id number]  HPI Here for increased frequency of urinations over the past 2 months. She often gets up to urinate 4-5 times at night. No discomfort or burning. No incontinence.    Review of Systems  Constitutional: Negative.   Respiratory: Negative.   Cardiovascular: Negative.   Gastrointestinal: Negative.   Genitourinary: Positive for frequency. Negative for dysuria, flank pain, hematuria and urgency.       Objective:   Physical Exam  Constitutional: She is oriented to person, place, and time. She appears well-developed and well-nourished.  Cardiovascular: Normal rate, regular rhythm, normal heart sounds and intact distal pulses.   Pulmonary/Chest: Effort normal and breath sounds normal.  Abdominal: Soft. Bowel sounds are normal. She exhibits no distension and no mass. There is no tenderness. There is no rebound and no guarding.  Neurological: She is alert and oriented to person, place, and time.          Assessment & Plan:  Urinary frequency. We will give her a sterile cup to take home to get a urine sample, since she could not produce one here today. We will then treat accordingly.  Laurey Morale, MD

## 2016-08-06 ENCOUNTER — Telehealth: Payer: Self-pay | Admitting: Family Medicine

## 2016-08-06 NOTE — Telephone Encounter (Signed)
Pt would like to have her lab work results and know what the next step is.

## 2016-08-07 ENCOUNTER — Other Ambulatory Visit: Payer: Self-pay | Admitting: Family Medicine

## 2016-08-07 MED ORDER — CIPROFLOXACIN HCL 500 MG PO TABS: 500.0000 mg | ORAL_TABLET | Freq: Two times a day (BID) | ORAL | 0 refills | Status: DC

## 2016-08-07 NOTE — Telephone Encounter (Signed)
See lab result note.

## 2016-08-07 NOTE — Telephone Encounter (Signed)
Pt is aware waiting on md °

## 2016-08-08 ENCOUNTER — Other Ambulatory Visit: Payer: Self-pay | Admitting: Cardiology

## 2016-08-18 ENCOUNTER — Other Ambulatory Visit: Payer: Self-pay | Admitting: Cardiology

## 2016-08-26 ENCOUNTER — Other Ambulatory Visit: Payer: Self-pay | Admitting: Cardiology

## 2016-08-26 ENCOUNTER — Ambulatory Visit (INDEPENDENT_AMBULATORY_CARE_PROVIDER_SITE_OTHER): Payer: PPO | Admitting: *Deleted

## 2016-08-26 DIAGNOSIS — I4891 Unspecified atrial fibrillation: Secondary | ICD-10-CM | POA: Diagnosis not present

## 2016-08-26 DIAGNOSIS — Z5181 Encounter for therapeutic drug level monitoring: Secondary | ICD-10-CM | POA: Diagnosis not present

## 2016-08-26 DIAGNOSIS — I482 Chronic atrial fibrillation, unspecified: Secondary | ICD-10-CM

## 2016-08-26 LAB — POCT INR: INR: 2.8

## 2016-08-26 NOTE — Telephone Encounter (Signed)
I would suggest PCP

## 2016-08-26 NOTE — Telephone Encounter (Signed)
Pharmacy requesting a refill on allopurinol. Would you like to refill? Please advise

## 2016-08-29 ENCOUNTER — Other Ambulatory Visit: Payer: Self-pay | Admitting: Cardiology

## 2016-09-01 ENCOUNTER — Other Ambulatory Visit: Payer: Self-pay | Admitting: Cardiology

## 2016-09-01 ENCOUNTER — Other Ambulatory Visit: Payer: Self-pay | Admitting: Family Medicine

## 2016-09-01 NOTE — Telephone Encounter (Signed)
Medication Detail    Disp Refills Start End   KLOR-CON M10 10 MEQ tablet 180 tablet 3 08/10/2016    Sig: TAKE 1 TABLET BY MOUTH TWICE A DAY   E-Prescribing Status: Receipt confirmed by pharmacy (08/10/2016 10:49 AM EST)   Pharmacy   [id number] [address], [place]

## 2016-09-02 NOTE — Telephone Encounter (Signed)
I would suggest PCP

## 2016-09-02 NOTE — Telephone Encounter (Signed)
Okay to refill allopurinol? Please advise. Thanks, MI

## 2016-09-04 ENCOUNTER — Other Ambulatory Visit: Payer: Self-pay | Admitting: Cardiology

## 2016-09-04 NOTE — Telephone Encounter (Signed)
Approved    Disp Refills Start End  allopurinol (ZYLOPRIM) 300 MG tablet 90 tablet 3 09/03/2016   Sig:  TAKE 1 TABLET BY MOUTH EVERY DAY  Class:  Normal  DAW:  No  Authorizing Provider:  Larey Dresser, MD  Visit Pharmacy   [id number] - [address], [place]

## 2016-09-08 ENCOUNTER — Other Ambulatory Visit: Payer: Self-pay | Admitting: Family Medicine

## 2016-09-08 ENCOUNTER — Ambulatory Visit (INDEPENDENT_AMBULATORY_CARE_PROVIDER_SITE_OTHER): Payer: PPO | Admitting: Family Medicine

## 2016-09-08 ENCOUNTER — Encounter: Payer: Self-pay | Admitting: Family Medicine

## 2016-09-08 ENCOUNTER — Ambulatory Visit: Payer: Self-pay | Admitting: Family Medicine

## 2016-09-08 VITALS — BP 114/97 | HR 100 | Ht 63.0 in | Wt 90.0 lb

## 2016-09-08 DIAGNOSIS — A084 Viral intestinal infection, unspecified: Secondary | ICD-10-CM

## 2016-09-08 NOTE — Progress Notes (Signed)
   Subjective:    Patient ID: [name], female    DOB: 30-Jan-1926, 82 y.o.   MRN: [id number]  HPI Here for 3 days of diarrhea that started last Friday. She had watery diarrhea for 2 days and then 2 days ago it was semi-formed. She has not had a stool at all now for the past 48 hours. She denies any nausea or vomiting, no cramps, and no fever. No recent travel. Drinking fluids.    Review of Systems  Constitutional: Negative.   Respiratory: Negative.   Cardiovascular: Negative.   Gastrointestinal: Positive for diarrhea. Negative for abdominal distention, abdominal pain, anal bleeding, blood in stool, constipation, nausea, rectal pain and vomiting.  Neurological: Negative.        Objective:   Physical Exam  Constitutional: She appears well-developed and well-nourished. No distress.  Cardiovascular: Normal rate, regular rhythm, normal heart sounds and intact distal pulses.   Pulmonary/Chest: Effort normal and breath sounds normal.  Abdominal: Soft. Bowel sounds are normal. She exhibits no distension and no mass. There is no tenderness. There is no rebound and no guarding.          Assessment & Plan:  Viral enteritis which seems to be resolving. She will follow up prn.  Laurey Morale, MD

## 2016-09-08 NOTE — Progress Notes (Signed)
Pre visit review using our clinic review tool, if applicable. No additional management support is needed unless otherwise documented below in the visit note. 

## 2016-09-09 NOTE — Telephone Encounter (Signed)
Looks like pt needs a TSH level?

## 2016-09-10 MED ORDER — LEVOTHYROXINE SODIUM 50 MCG PO TABS: 50.0000 ug | ORAL_TABLET | Freq: Every day | ORAL | 0 refills | Status: DC

## 2016-10-07 ENCOUNTER — Ambulatory Visit (INDEPENDENT_AMBULATORY_CARE_PROVIDER_SITE_OTHER): Payer: PPO | Admitting: *Deleted

## 2016-10-07 DIAGNOSIS — I4891 Unspecified atrial fibrillation: Secondary | ICD-10-CM | POA: Diagnosis not present

## 2016-10-07 DIAGNOSIS — I482 Chronic atrial fibrillation, unspecified: Secondary | ICD-10-CM

## 2016-10-07 DIAGNOSIS — Z5181 Encounter for therapeutic drug level monitoring: Secondary | ICD-10-CM | POA: Diagnosis not present

## 2016-10-07 LAB — POCT INR: INR: 2

## 2016-10-08 ENCOUNTER — Telehealth: Payer: Self-pay | Admitting: *Deleted

## 2016-10-08 NOTE — Telephone Encounter (Signed)
S/w pt and advised her handicap form filled out. Pt asked if I would please put in the mail for her. Handicap form mailed today to verified address for the pt.

## 2016-11-18 ENCOUNTER — Ambulatory Visit (INDEPENDENT_AMBULATORY_CARE_PROVIDER_SITE_OTHER): Payer: PPO | Admitting: *Deleted

## 2016-11-18 DIAGNOSIS — I482 Chronic atrial fibrillation, unspecified: Secondary | ICD-10-CM

## 2016-11-18 DIAGNOSIS — I4891 Unspecified atrial fibrillation: Secondary | ICD-10-CM | POA: Diagnosis not present

## 2016-11-18 DIAGNOSIS — Z5181 Encounter for therapeutic drug level monitoring: Secondary | ICD-10-CM | POA: Diagnosis not present

## 2016-11-18 LAB — POCT INR: INR: 2.1

## 2016-12-07 ENCOUNTER — Other Ambulatory Visit: Payer: Self-pay | Admitting: Family Medicine

## 2016-12-11 ENCOUNTER — Other Ambulatory Visit: Payer: Self-pay | Admitting: Cardiology

## 2016-12-14 ENCOUNTER — Ambulatory Visit: Payer: Self-pay | Admitting: Family Medicine

## 2016-12-17 ENCOUNTER — Ambulatory Visit (INDEPENDENT_AMBULATORY_CARE_PROVIDER_SITE_OTHER): Payer: PPO | Admitting: Family Medicine

## 2016-12-17 ENCOUNTER — Encounter: Payer: Self-pay | Admitting: Family Medicine

## 2016-12-17 VITALS — BP 130/70 | Temp 97.4°F | Ht 63.0 in | Wt 88.0 lb

## 2016-12-17 DIAGNOSIS — I10 Essential (primary) hypertension: Secondary | ICD-10-CM | POA: Diagnosis not present

## 2016-12-17 DIAGNOSIS — R739 Hyperglycemia, unspecified: Secondary | ICD-10-CM | POA: Diagnosis not present

## 2016-12-17 DIAGNOSIS — R300 Dysuria: Secondary | ICD-10-CM

## 2016-12-17 DIAGNOSIS — M5432 Sciatica, left side: Secondary | ICD-10-CM | POA: Diagnosis not present

## 2016-12-17 LAB — CBC WITH DIFFERENTIAL/PLATELET
Basophils Absolute: 0 10*3/uL (ref 0.0–0.1)
Basophils Relative: 0.9 % (ref 0.0–3.0)
Eosinophils Absolute: 0.1 10*3/uL (ref 0.0–0.7)
Eosinophils Relative: 2.6 % (ref 0.0–5.0)
HCT: 37.6 % (ref 36.0–46.0)
HEMOGLOBIN: 12.1 g/dL (ref 12.0–15.0)
LYMPHS ABS: 2.2 10*3/uL (ref 0.7–4.0)
LYMPHS PCT: 41.8 % (ref 12.0–46.0)
MCHC: 32.3 g/dL (ref 30.0–36.0)
MCV: 100.6 fl — AB (ref 78.0–100.0)
MONOS PCT: 9 % (ref 3.0–12.0)
Monocytes Absolute: 0.5 10*3/uL (ref 0.1–1.0)
NEUTROS PCT: 45.7 % (ref 43.0–77.0)
Neutro Abs: 2.4 10*3/uL (ref 1.4–7.7)
Platelets: 312 10*3/uL (ref 150.0–400.0)
RBC: 3.74 Mil/uL — ABNORMAL LOW (ref 3.87–5.11)
RDW: 14.2 % (ref 11.5–15.5)
WBC: 5.2 10*3/uL (ref 4.0–10.5)

## 2016-12-17 LAB — BASIC METABOLIC PANEL
BUN: 8 mg/dL (ref 6–23)
CHLORIDE: 102 meq/L (ref 96–112)
CO2: 27 mEq/L (ref 19–32)
Calcium: 9.8 mg/dL (ref 8.4–10.5)
Creatinine, Ser: 0.64 mg/dL (ref 0.40–1.20)
GFR: 92.95 mL/min (ref >60.00)
Glucose, Bld: 90 mg/dL (ref 70–99)
POTASSIUM: 4.1 meq/L (ref 3.5–5.1)
SODIUM: 137 meq/L (ref 135–145)

## 2016-12-17 LAB — LIPID PANEL
Cholesterol: 146 mg/dL (ref 0–200)
HDL: 38 mg/dL — ABNORMAL LOW (ref >39.00)
LDL Cholesterol: 91 mg/dL (ref 0–99)
NONHDL: 107.97
TRIGLYCERIDES: 87 mg/dL (ref 0.0–149.0)
Total CHOL/HDL Ratio: 4
VLDL: 17.4 mg/dL (ref 0.0–40.0)

## 2016-12-17 LAB — TSH: TSH: 0.08 u[IU]/mL — ABNORMAL LOW (ref 0.35–4.50)

## 2016-12-17 LAB — HEPATIC FUNCTION PANEL
ALK PHOS: 74 U/L (ref 39–117)
ALT: 6 U/L (ref 0–35)
AST: 14 U/L (ref 0–37)
Albumin: 3.9 g/dL (ref 3.5–5.2)
BILIRUBIN DIRECT: 0.1 mg/dL (ref 0.0–0.3)
Total Bilirubin: 0.5 mg/dL (ref 0.2–1.2)
Total Protein: 7.7 g/dL (ref 6.0–8.3)

## 2016-12-17 LAB — POC URINALSYSI DIPSTICK (AUTOMATED)
BILIRUBIN UA: NEGATIVE
Glucose, UA: NEGATIVE
KETONES UA: NEGATIVE
Nitrite, UA: POSITIVE
Protein, UA: NEGATIVE
Spec Grav, UA: 1.015
Urobilinogen, UA: 0.2
pH, UA: 6

## 2016-12-17 LAB — HEMOGLOBIN A1C: Hgb A1c MFr Bld: 5.8 % (ref 4.6–6.5)

## 2016-12-17 MED ORDER — METHYLPREDNISOLONE 4 MG PO TBPK: ORAL_TABLET | ORAL | 0 refills | Status: DC

## 2016-12-17 NOTE — Progress Notes (Signed)
Pre visit review using our clinic review tool, if applicable. No additional management support is needed unless otherwise documented below in the visit note. 

## 2016-12-17 NOTE — Progress Notes (Signed)
   Subjective:    Patient ID: [name], female    DOB: 11/18/1925, 83 y.o.   MRN: [id number]  HPI Here with her daughter complaining of left leg pain that started about 2 weeks ago. No recent trauma. She denies back pain but the pain starts in the left buttock and radiates down the left leg to the ankle. It is sharp and intermittent. No numbness or weakness. Using Tylenol.    Review of Systems  Constitutional: Negative.   Respiratory: Negative.   Cardiovascular: Negative.   Musculoskeletal: Positive for gait problem and myalgias.       Objective:   Physical Exam  Constitutional: She appears well-developed and well-nourished.  Walks with her walker   Cardiovascular: Normal rate, regular rhythm, normal heart sounds and intact distal pulses.   Pulmonary/Chest: Effort normal and breath sounds normal.  Musculoskeletal:  She is not tender in the lower back but she has advanced scoliosis and her ROOM is quite limited. She is very tender over the left sciatic notch however. SLR are negative          Assessment & Plan:  Sciatica. Rest, heat. Given a Medrol dose pack to reduce inflammation. Continue Tylenol. Recheck prn.  Alysia Penna, MD

## 2016-12-17 NOTE — Patient Instructions (Signed)
WE NOW OFFER   [facility] FAST TRACK!!!  SAME DAY Appointments for ACUTE CARE  Such as: Sprains, Injuries, cuts, abrasions, rashes, muscle pain, joint pain, back pain Colds, flu, sore throats, headache, allergies, cough, fever  Ear pain, sinus and eye infections Abdominal pain, nausea, vomiting, diarrhea, upset stomach Animal/insect bites  3 Easy Ways to Schedule: Walk-In Scheduling Call in scheduling Mychart Sign-up: [email or web address]         

## 2016-12-19 LAB — URINE CULTURE

## 2016-12-22 ENCOUNTER — Other Ambulatory Visit: Payer: Self-pay | Admitting: Family Medicine

## 2016-12-22 MED ORDER — CIPROFLOXACIN HCL 500 MG PO TABS: 500.0000 mg | ORAL_TABLET | Freq: Two times a day (BID) | ORAL | 0 refills | Status: DC

## 2016-12-24 ENCOUNTER — Other Ambulatory Visit: Payer: Self-pay | Admitting: Family Medicine

## 2016-12-28 DIAGNOSIS — M5136 Other intervertebral disc degeneration, lumbar region: Secondary | ICD-10-CM | POA: Diagnosis not present

## 2016-12-30 ENCOUNTER — Ambulatory Visit (INDEPENDENT_AMBULATORY_CARE_PROVIDER_SITE_OTHER): Payer: PPO | Admitting: *Deleted

## 2016-12-30 DIAGNOSIS — Z5181 Encounter for therapeutic drug level monitoring: Secondary | ICD-10-CM

## 2016-12-30 DIAGNOSIS — I482 Chronic atrial fibrillation, unspecified: Secondary | ICD-10-CM

## 2016-12-30 DIAGNOSIS — I4891 Unspecified atrial fibrillation: Secondary | ICD-10-CM

## 2016-12-30 LAB — POCT INR: INR: 3.6

## 2017-01-13 ENCOUNTER — Ambulatory Visit (INDEPENDENT_AMBULATORY_CARE_PROVIDER_SITE_OTHER): Payer: PPO | Admitting: *Deleted

## 2017-01-13 ENCOUNTER — Other Ambulatory Visit: Payer: Self-pay | Admitting: Cardiology

## 2017-01-13 DIAGNOSIS — I482 Chronic atrial fibrillation, unspecified: Secondary | ICD-10-CM

## 2017-01-13 DIAGNOSIS — Z5181 Encounter for therapeutic drug level monitoring: Secondary | ICD-10-CM | POA: Diagnosis not present

## 2017-01-13 DIAGNOSIS — I4891 Unspecified atrial fibrillation: Secondary | ICD-10-CM | POA: Diagnosis not present

## 2017-01-13 LAB — POCT INR: INR: 2.5

## 2017-01-21 ENCOUNTER — Other Ambulatory Visit (HOSPITAL_COMMUNITY): Payer: Self-pay | Admitting: Cardiology

## 2017-01-25 ENCOUNTER — Other Ambulatory Visit: Payer: Self-pay | Admitting: Cardiology

## 2017-01-26 DIAGNOSIS — H35033 Hypertensive retinopathy, bilateral: Secondary | ICD-10-CM | POA: Diagnosis not present

## 2017-01-26 DIAGNOSIS — H524 Presbyopia: Secondary | ICD-10-CM | POA: Diagnosis not present

## 2017-02-03 ENCOUNTER — Ambulatory Visit (INDEPENDENT_AMBULATORY_CARE_PROVIDER_SITE_OTHER): Payer: PPO | Admitting: *Deleted

## 2017-02-03 DIAGNOSIS — Z5181 Encounter for therapeutic drug level monitoring: Secondary | ICD-10-CM | POA: Diagnosis not present

## 2017-02-03 DIAGNOSIS — I482 Chronic atrial fibrillation, unspecified: Secondary | ICD-10-CM

## 2017-02-03 DIAGNOSIS — I4891 Unspecified atrial fibrillation: Secondary | ICD-10-CM | POA: Diagnosis not present

## 2017-02-03 LAB — POCT INR: INR: 3.2

## 2017-02-08 ENCOUNTER — Other Ambulatory Visit: Payer: Self-pay | Admitting: Physician Assistant

## 2017-02-17 ENCOUNTER — Ambulatory Visit (INDEPENDENT_AMBULATORY_CARE_PROVIDER_SITE_OTHER): Payer: PPO | Admitting: Pharmacist

## 2017-02-17 DIAGNOSIS — Z5181 Encounter for therapeutic drug level monitoring: Secondary | ICD-10-CM | POA: Diagnosis not present

## 2017-02-17 DIAGNOSIS — I4891 Unspecified atrial fibrillation: Secondary | ICD-10-CM

## 2017-02-17 DIAGNOSIS — I482 Chronic atrial fibrillation, unspecified: Secondary | ICD-10-CM

## 2017-02-17 LAB — POCT INR: INR: 2.3

## 2017-03-10 ENCOUNTER — Encounter: Payer: Self-pay | Admitting: Physician Assistant

## 2017-03-10 ENCOUNTER — Ambulatory Visit (INDEPENDENT_AMBULATORY_CARE_PROVIDER_SITE_OTHER): Payer: PPO | Admitting: Pharmacist

## 2017-03-10 ENCOUNTER — Ambulatory Visit (INDEPENDENT_AMBULATORY_CARE_PROVIDER_SITE_OTHER): Payer: PPO | Admitting: Physician Assistant

## 2017-03-10 VITALS — BP 130/60 | HR 79 | Ht 63.0 in | Wt 82.4 lb

## 2017-03-10 DIAGNOSIS — I4821 Permanent atrial fibrillation: Secondary | ICD-10-CM

## 2017-03-10 DIAGNOSIS — I4891 Unspecified atrial fibrillation: Secondary | ICD-10-CM | POA: Diagnosis not present

## 2017-03-10 DIAGNOSIS — I10 Essential (primary) hypertension: Secondary | ICD-10-CM | POA: Diagnosis not present

## 2017-03-10 DIAGNOSIS — Z5181 Encounter for therapeutic drug level monitoring: Secondary | ICD-10-CM

## 2017-03-10 DIAGNOSIS — I5032 Chronic diastolic (congestive) heart failure: Secondary | ICD-10-CM | POA: Diagnosis not present

## 2017-03-10 DIAGNOSIS — I482 Chronic atrial fibrillation: Secondary | ICD-10-CM

## 2017-03-10 LAB — POCT INR: INR: 2.7

## 2017-03-10 NOTE — Progress Notes (Signed)
Cardiology Office Note:    Date:  03/10/2017   ID:  [name], DOB 03/25/1926, MRN [id number]  PCP:  Christy Morale, MD  Cardiologist:  Dr. [name]    Referring MD: Christy Morale, MD   Chief Complaint  Patient presents with  . Atrial Fibrillation    Follow up    History of Present Illness:    [name] is a 83 y.o. female with a hx of chronic AF, sinus node dysfunction, chronic diastolic HF. She was admitted to the hospital in 4/15 with atrial fibrillation with RVR and acute on chronic diastolic CHF. She was diuresed and digoxin was restarted for rate control. Holter done after restarting digoxin showed average HR 88 (atrial fibrillation) with occasional pauses, longest was 2.9 seconds.  Repeat Holter in 03/2016 demonstrated an elevated HR up to 140, but her avg HR was ok at 89.     [name] returns for Cardiology follow up.  She is here with her daughter, [name].  She is feeling good.  Her appetite remains poor. She has a lot of back pain.  She denies chest pain, shortness of breath, syncope, edema, bleeding issues.    Prior CV studies:   The following studies were reviewed today:  Holter 6/17 Sustained atrial fibrillation with some PVCs, HR up to 140 but average rate 89.    Head CT 02/14/16 IMPRESSION: No acute intracranial findings. Small left frontal scalp contusion. No fracture. Old right MCA territory infarct. Lacunar infarct over the left thalamus/ internal capsule. Mild atrophic change with chronic ischemic microvascular disease.   Carotid US 4/16 Bilateral 1-39% ICA   Holter 4/15 Persistent AFib, Freq PVCs, HR 51-127, avg HR 88, longest pause 2.9 sec   Echo 1/15 EF 55%, no RWMA, probably mild AS, mild AI, mild MR, mild LAE   Past Medical History:  Diagnosis Date  . Aortic stenosis    a. mild on echo 10/2013  . Blood clotting disorder ([place])   . Bradycardia    a. Holter (1/15):  avg HR 78, 1.4% PVCs, several pauses </= 3 sec, b.  Holter  (01/2014):  AFib, freq PVCs, HR 51-127, Avg HR 88, longest pause 2.9 sec - no changes made  . Carotid stenosis    a. Carotid US (4/14):  RICA 40-59%;  b. Carotid US (01/2014) bilateral 1-39%  . Chronic atrial fibrillation (HCC)    a. permanent;  b. chronic coumadin;  c. 10/2013 digoxin and atenolol d/c'd 2/2 pauses of <3 sec;  d. 01/2014 digoxin resumed @ 0.125 mg in setting of difficult to control rate and diast chf.  . Chronic diastolic heart failure ([place])    a. Echo (6/08):  EF 55-60%, mild MR;  b. Echo (10/2013):  EF 55%, mild AS  . Contact dermatitis and other eczema, due to unspecified cause    a. h/o myalgias with atorvastatin.  . Diverticulosis of colon (without mention of hemorrhage)   . DJD (degenerative joint disease)   . Frequent UTI    Dr. Terance Hart sees pt  . Gout, unspecified   . H/O: CVA (cardiovascular accident) 2008  . History of CVA (cerebrovascular accident)    2008  . History of melanoma    on right ear  . Hyperlipidemia   . Hypertension    a. hx of edema with Amlodipine.  . Hypothyroidism   . Lumbago    chronic low back pain  . Osteoporosis    las DEXA on 01/30/10  .  Skin cancer    forehead  . Urticaria, unspecified   1. Chronic atrial fibrillation 2. Gout 3. Diastolic CHF: Echo (5/09) with EF 55-60%, mild MR.  Echo (1/15) with EF 55%, probably at least mild AS.   4. HTN: Edema with amlodipine . 5. Hyperlipidemia: myalgias with atorvastatin 6. H/o UTIs   7. Chronic low back pain.   8. Hypothyroidism 9. CVA in 2008.   10.  OA 11. Diverticulosis 12. Osteoporosis 13. H/o melanoma 14. Carotid stenosis: Dopplers (4/14) with 40-59% RICA stenosis (has been stable).   15. Bradycardia: Holter (1/15) with average HR 78, 1.4% PVCs, several pauses </= 3 seconds.  Holter (4/15) with atrial fibrillation, average HR 88, frequent PVCs, longest pause 2.9 sec 16. Aortic stenosis: At least mild on 1/15 echo.    Past Surgical History:  Procedure Laterality Date  .  ABDOMINAL HYSTERECTOMY    . BAND HEMORRHOIDECTOMY  last on 10-28-11   x 2, per Dr. Deatra Ina   . COLONOSCOPY  07-02-11   Dr. Deatra Ina, adenomatous polyp, no repeats are planned   . FLEXIBLE SIGMOIDOSCOPY  10/28/2011   Procedure: FLEXIBLE SIGMOIDOSCOPY;  Surgeon: Inda Castle, MD;  Location: [facility];  Service: Endoscopy;  Laterality: N/A;  . GASTRIC VARICES BANDING  10/28/2011   Procedure: HEMORRHOID BANDING;  Surgeon: Inda Castle, MD;  Location: [facility];  Service: Endoscopy;  Laterality: N/A;  . SKIN CANCER EXCISION  2001   removed right ear per Dr. Denna Haggard, skin grafts harvested from right cheek  . TONSILLECTOMY      Current Medications: Current Meds  Medication Sig  . allopurinol (ZYLOPRIM) 300 MG tablet TAKE 1 TABLET BY MOUTH EVERY DAY  . digoxin (LANOXIN) 0.125 MG tablet TAKE 1/2 A TABLET BY MOUTH EVERY DAY  . diltiazem (CARDIZEM CD) 240 MG 24 hr capsule TAKE ONE CAPSULE BY MOUTH EVERY DAY  . furosemide (LASIX) 20 MG tablet TAKE 1 TABLET EVERY OTHER DAY (IF WEIGHT INCREASES BY 2 LBS OR MORE IN 1 DAY THEN TAKE 1 TAB DAILY)  . KLOR-CON M10 10 MEQ tablet TAKE 1 TABLET BY MOUTH TWICE A DAY  . levothyroxine (SYNTHROID, LEVOTHROID) 50 MCG tablet TAKE 1 TABLET (50 MCG TOTAL) BY MOUTH DAILY.  . metoprolol succinate (TOPROL-XL) 50 MG 24 hr tablet Take 1 tablet (50 mg total) by mouth as directed. 50 mg in the AM and 25 mg in the PM  . oxyCODONE-acetaminophen (PERCOCET/ROXICET) 5-325 MG per tablet Take 1 tablet by mouth every 4 (four) hours as needed for moderate pain or severe pain.  . pravastatin (PRAVACHOL) 10 MG tablet TAKE 1 TABLET BY MOUTH ON MON,WED,AND FRIDAYS  . warfarin (COUMADIN) 5 MG tablet TAKE 1 TABLET (5 MG TOTAL) BY MOUTH AS DIRECTED.     Allergies:   Amlodipine; Amoxicillin; Hydrocodone-acetaminophen; Nitrofurantoin; and Sulfonamide derivatives   Social History   Social History  . Marital status: Widowed    Spouse name: N/A  . Number of children: 3  . Years of  education: N/A   Occupational History  . retired Retired   Social History Main Topics  . Smoking status: Former Smoker    Quit date: 10/05/1994  . Smokeless tobacco: Never Used  . Alcohol use No  . Drug use: No  . Sexual activity: No   Other Topics Concern  . Not on file   Social History Narrative  . No narrative on file     Family Hx: The patient's family history includes Anemia in her sister; Colon  polyps in her mother; Hypertension in her mother; Stroke in her father. There is no history of Colon cancer or Heart attack.  ROS:   Please see the history of present illness.    ROS All other systems reviewed and are negative.   EKGs/Labs/Other Test Reviewed:    EKG:  EKG is  ordered today.  The ekg ordered today demonstrates atrial fibrillation, HR 79, PVC vs aberrancy   Recent Labs: 12/17/2016: ALT 6; BUN 8; Creatinine, Ser 0.64; Hemoglobin 12.1; Platelets 312.0; Potassium 4.1; Sodium 137; TSH 0.08   Recent Lipid Panel Lab Results  Component Value Date/Time   CHOL 146 12/17/2016 12:33 PM   TRIG 87.0 12/17/2016 12:33 PM   HDL 38.00 (L) 12/17/2016 12:33 PM   CHOLHDL 4 12/17/2016 12:33 PM   LDLCALC 91 12/17/2016 12:33 PM   LDLDIRECT 141.5 08/06/2010 09:44 AM    Physical Exam:    VS:  BP 130/60   Pulse 79   Ht 5\' 3"  (1.6 m)   Wt 82 lb 6.4 oz (37.4 kg)   BMI 14.60 kg/m     Wt Readings from Last 3 Encounters:  03/10/17 82 lb 6.4 oz (37.4 kg)  12/17/16 88 lb (39.9 kg)  09/08/16 90 lb (40.8 kg)     Physical Exam  Constitutional: She is oriented to person, place, and time. She appears cachectic. No distress.  HENT:  Head: Normocephalic and atraumatic.  Neck: No JVD present.  Cardiovascular: Normal rate, S1 normal and S2 normal.  An irregularly irregular rhythm present.  No murmur heard. Pulmonary/Chest: She has no wheezes. She has no rales.  Abdominal: There is no tenderness.  Musculoskeletal: She exhibits no edema.  Neurological: She is alert and oriented to  person, place, and time.  Skin: Skin is warm and dry.  Psychiatric: She has a normal mood and affect.    ASSESSMENT:    1. Permanent atrial fibrillation ([place])   2. Chronic diastolic CHF (congestive heart failure) ([place])   3. Essential hypertension    PLAN:    In order of problems listed above:  1. Permanent atrial fibrillation (HCC) -  Rate is well controlled. She is tolerating Coumadin. She would like to come off of some of her medications. With a history of normal LV function, I would like to get her off of digoxin at some point. However, this was needed to get her heart rate under control. Obtain BMET, digoxin level today. If her heart rate remains controlled over the next 1-2 visits, I will consider discontinuing her digoxin.  2. Chronic diastolic CHF (congestive heart failure) (HCC) -  Volume is stable. She is NYHA 2-2b. Continue current regimen.  3. Essential hypertension - The patient's blood pressure is controlled on her current regimen.  Continue current therapy.    Dispo:  Return in about 6 months (around 09/09/2017) for Routine Follow Up, w/ [name], PA-C.   Medication Adjustments/Labs and Tests Ordered: Current medicines are reviewed at length with the patient today.  Concerns regarding medicines are outlined above.  Orders/Tests:  Orders Placed This Encounter  Procedures  . Basic Metabolic Panel (BMET)  . Digoxin level  . EKG 12-Lead   Medication changes: No orders of the defined types were placed in this encounter.  Signed, [name], PA-C  03/10/2017 1:22 PM    [facility] [place], [place], [place]  [address] Phone: [phone]; Fax: [phone]

## 2017-03-10 NOTE — Patient Instructions (Addendum)
Medication Instructions:  Your physician recommends that you continue on your current medications as directed. Please refer to the Current Medication list given to you today.   Labwork: TODAY BMET, DIGOXIN LEVEL  Testing/Procedures: NONE ORDRED  Follow-Up: Your physician wants you to follow-up in: [place], [facility]  You will receive a reminder letter in the mail two months in advance. If you don't receive a letter, please call our office to schedule the follow-up appointment.   Any Other Special Instructions Will Be Listed Below (If Applicable).     If you need a refill on your cardiac medications before your next appointment, please call your pharmacy.

## 2017-03-11 ENCOUNTER — Telehealth: Payer: Self-pay

## 2017-03-11 DIAGNOSIS — I4821 Permanent atrial fibrillation: Secondary | ICD-10-CM

## 2017-03-11 LAB — BASIC METABOLIC PANEL
BUN/Creatinine Ratio: 11 — ABNORMAL LOW (ref 12–28)
BUN: 8 mg/dL (ref 8–27)
CO2: 22 mmol/L (ref 18–29)
CREATININE: 0.72 mg/dL (ref 0.57–1.00)
Calcium: 9.6 mg/dL (ref 8.7–10.3)
Chloride: 102 mmol/L (ref 96–106)
GFR calc Af Amer: 86 mL/min/{1.73_m2} (ref >59)
GFR, EST NON AFRICAN AMERICAN: 75 mL/min/{1.73_m2} (ref >59)
Glucose: 83 mg/dL (ref 65–99)
POTASSIUM: 4.5 mmol/L (ref 3.5–5.2)
Sodium: 139 mmol/L (ref 134–144)

## 2017-03-11 LAB — DIGOXIN LEVEL: Digoxin, Serum: 0.9 ng/mL (ref 0.5–0.9)

## 2017-03-11 MED ORDER — METOPROLOL SUCCINATE ER 50 MG PO TB24: 50.0000 mg | ORAL_TABLET | Freq: Two times a day (BID) | ORAL | 3 refills | Status: DC

## 2017-03-11 NOTE — Telephone Encounter (Signed)
F/u Message  Pt states she forgot to ask a few questions. Please call back to discuss

## 2017-03-11 NOTE — Telephone Encounter (Signed)
Spoke with pt. Made patient aware of lab results and Trinidad Curet recommendations. Pt understands to discontinue the digoxin and to increase the Metoprolol Succinate to 50 mg twice a day. Reminded patient of Coumadin Appt for April 14, 2017 and that she is going to have an EKG at that appt per PA's order. Pt verbalized understanding and thanked me for my call.

## 2017-03-11 NOTE — Telephone Encounter (Signed)
-----   Message from Liliane Shi, PA-C sent at 03/11/2017  7:57 AM EDT ----- Please call patient. Kidney function is normal. The digoxin level is ok but I would have expected it to be lower. PLAN:  DC Digoxin Increase Metoprolol Succinate to 50 mg Twice daily  Get ECG at follow up with CVRR in July (INR check) and send to me. [name], PA-C    03/11/2017 7:54 AM

## 2017-03-11 NOTE — Telephone Encounter (Signed)
Confirmed with Pt about exactly how to take her Metoprolol. Pt verbalized understanding on taking Metoprolol Succinate 50 mg twice a day and to stop taking the digoxin.  Pt verbalized understanding of Nurse Visit Appt on April 14, 2017 at 2:00 pm for EKG (same day as INR check in coumadin clinic). Pt thanked me for my call.

## 2017-03-17 ENCOUNTER — Telehealth: Payer: Self-pay | Admitting: Family Medicine

## 2017-03-17 NOTE — Telephone Encounter (Signed)
Pt request refill  oxyCODONE-acetaminophen (PERCOCET/ROXICET) 5-325 MG per tablet

## 2017-03-18 MED ORDER — OXYCODONE-ACETAMINOPHEN 5-325 MG PO TABS: 1.0000 | ORAL_TABLET | ORAL | 0 refills | Status: DC | PRN

## 2017-03-18 NOTE — Telephone Encounter (Signed)
done

## 2017-03-19 NOTE — Telephone Encounter (Signed)
Script is ready for pick up here at front office and I spoke with pt.  

## 2017-03-24 ENCOUNTER — Other Ambulatory Visit: Payer: Self-pay | Admitting: Family Medicine

## 2017-03-24 NOTE — Telephone Encounter (Signed)
Change her Allopurinol to 100 mg and take 3 tabs daily. Call in #[phone] with 3 rf

## 2017-03-24 NOTE — Telephone Encounter (Signed)
Please see note from pharmacy about dose change.

## 2017-03-25 ENCOUNTER — Other Ambulatory Visit: Payer: Self-pay | Admitting: Family Medicine

## 2017-03-25 MED ORDER — ALLOPURINOL 100 MG PO TABS: 300.0000 mg | ORAL_TABLET | Freq: Every day | ORAL | 3 refills | Status: DC

## 2017-03-25 NOTE — Telephone Encounter (Signed)
See previous note

## 2017-04-14 ENCOUNTER — Other Ambulatory Visit: Payer: PPO

## 2017-04-14 ENCOUNTER — Ambulatory Visit: Payer: PPO

## 2017-04-14 ENCOUNTER — Ambulatory Visit (INDEPENDENT_AMBULATORY_CARE_PROVIDER_SITE_OTHER): Payer: PPO | Admitting: Pharmacist

## 2017-04-14 VITALS — BP 118/64 | HR 122 | Wt 83.0 lb

## 2017-04-14 DIAGNOSIS — I4821 Permanent atrial fibrillation: Secondary | ICD-10-CM

## 2017-04-14 DIAGNOSIS — I5032 Chronic diastolic (congestive) heart failure: Secondary | ICD-10-CM

## 2017-04-14 DIAGNOSIS — Z79899 Other long term (current) drug therapy: Secondary | ICD-10-CM

## 2017-04-14 DIAGNOSIS — Z5181 Encounter for therapeutic drug level monitoring: Secondary | ICD-10-CM

## 2017-04-14 DIAGNOSIS — I482 Chronic atrial fibrillation, unspecified: Secondary | ICD-10-CM

## 2017-04-14 LAB — POCT INR: INR: 2.5

## 2017-04-14 MED ORDER — DIGOXIN 125 MCG PO TABS: 0.0625 mg | ORAL_TABLET | Freq: Every day | ORAL | 3 refills | Status: DC

## 2017-04-14 NOTE — Patient Instructions (Addendum)
1.) Reason for visit: EKG for discontinuing Digoxin increasing Metoprolol Succinate 50 mg twice daily   2.) Name of MD requesting visit: [name], PA  3.) H&P: Patient stopped taking Digoxin and increased Metoprolol Succinate 50 mg twice daily on 03/11/17.  4.) ROS related to problem: Patient reports she is tolerating medication well. Patient states she has not had any issues or complaints.   5.) Assessment and plan per MD: Dr. Burt Knack reviewed and signed EKG. Dr. Burt Knack recommended pt to start back on Digoxin and have Digoxin level in 2 weeks. Continue all other medication as directed.       Medication:  Your physician has recommended you make the following change in your medication:  START Digoxin 1/2 tablet daily (0.0625 mg).   Lab:  Digoxin level in 2 weeks - lab appointment [address] office on 04/28/17

## 2017-04-15 ENCOUNTER — Encounter: Payer: Self-pay | Admitting: Physician Assistant

## 2017-04-15 NOTE — Progress Notes (Signed)
Agree. [name], PA-C    04/15/2017 8:17 AM

## 2017-04-27 ENCOUNTER — Other Ambulatory Visit (INDEPENDENT_AMBULATORY_CARE_PROVIDER_SITE_OTHER): Payer: PPO

## 2017-04-27 DIAGNOSIS — I482 Chronic atrial fibrillation, unspecified: Secondary | ICD-10-CM

## 2017-04-28 ENCOUNTER — Other Ambulatory Visit: Payer: PPO | Admitting: *Deleted

## 2017-04-28 DIAGNOSIS — I5032 Chronic diastolic (congestive) heart failure: Secondary | ICD-10-CM

## 2017-04-28 DIAGNOSIS — Z79899 Other long term (current) drug therapy: Secondary | ICD-10-CM

## 2017-04-29 LAB — DIGOXIN LEVEL: Digoxin, Serum: 1.1 ng/mL — ABNORMAL HIGH (ref 0.5–0.9)

## 2017-04-30 ENCOUNTER — Other Ambulatory Visit: Payer: Self-pay | Admitting: *Deleted

## 2017-04-30 DIAGNOSIS — R7889 Finding of other specified substances, not normally found in blood: Secondary | ICD-10-CM

## 2017-05-02 ENCOUNTER — Other Ambulatory Visit: Payer: Self-pay | Admitting: Physician Assistant

## 2017-05-05 ENCOUNTER — Other Ambulatory Visit: Payer: Self-pay

## 2017-05-05 MED ORDER — DIGOXIN 125 MCG PO TABS: 0.0625 mg | ORAL_TABLET | ORAL | 3 refills | Status: DC

## 2017-05-14 ENCOUNTER — Other Ambulatory Visit: Payer: PPO | Admitting: *Deleted

## 2017-05-14 DIAGNOSIS — R7889 Finding of other specified substances, not normally found in blood: Secondary | ICD-10-CM | POA: Diagnosis not present

## 2017-05-14 LAB — DIGOXIN LEVEL: Digoxin, Serum: <0.4 ng/mL — ABNORMAL LOW (ref 0.5–0.9)

## 2017-05-26 ENCOUNTER — Ambulatory Visit (INDEPENDENT_AMBULATORY_CARE_PROVIDER_SITE_OTHER): Payer: PPO

## 2017-05-26 DIAGNOSIS — I482 Chronic atrial fibrillation: Secondary | ICD-10-CM | POA: Diagnosis not present

## 2017-05-26 DIAGNOSIS — Z5181 Encounter for therapeutic drug level monitoring: Secondary | ICD-10-CM | POA: Diagnosis not present

## 2017-05-26 DIAGNOSIS — I4821 Permanent atrial fibrillation: Secondary | ICD-10-CM

## 2017-05-26 LAB — POCT INR: INR: 2.1

## 2017-06-24 ENCOUNTER — Encounter: Payer: Self-pay | Admitting: Family Medicine

## 2017-07-07 ENCOUNTER — Ambulatory Visit (INDEPENDENT_AMBULATORY_CARE_PROVIDER_SITE_OTHER): Payer: PPO | Admitting: *Deleted

## 2017-07-07 DIAGNOSIS — Z5181 Encounter for therapeutic drug level monitoring: Secondary | ICD-10-CM

## 2017-07-07 DIAGNOSIS — I482 Chronic atrial fibrillation: Secondary | ICD-10-CM

## 2017-07-07 DIAGNOSIS — I4821 Permanent atrial fibrillation: Secondary | ICD-10-CM

## 2017-07-07 LAB — POCT INR: INR: 2.7

## 2017-07-15 ENCOUNTER — Ambulatory Visit: Payer: PPO

## 2017-07-20 ENCOUNTER — Ambulatory Visit (INDEPENDENT_AMBULATORY_CARE_PROVIDER_SITE_OTHER): Payer: PPO | Admitting: *Deleted

## 2017-07-20 DIAGNOSIS — Z23 Encounter for immunization: Secondary | ICD-10-CM | POA: Diagnosis not present

## 2017-07-27 ENCOUNTER — Other Ambulatory Visit: Payer: Self-pay | Admitting: Cardiology

## 2017-08-18 ENCOUNTER — Ambulatory Visit (INDEPENDENT_AMBULATORY_CARE_PROVIDER_SITE_OTHER): Payer: PPO

## 2017-08-18 DIAGNOSIS — Z5181 Encounter for therapeutic drug level monitoring: Secondary | ICD-10-CM | POA: Diagnosis not present

## 2017-08-18 DIAGNOSIS — I4821 Permanent atrial fibrillation: Secondary | ICD-10-CM

## 2017-08-18 DIAGNOSIS — I482 Chronic atrial fibrillation: Secondary | ICD-10-CM

## 2017-08-18 LAB — POCT INR: INR: 2.4

## 2017-08-18 NOTE — Patient Instructions (Signed)
Continue taking 1/2 pill every day except 1 pill on Mondays, Wednesdays and Fridays.  Recheck INR in 6 weeks. Call if placed on any new medications or scheduled for any procedures [phone]

## 2017-08-25 ENCOUNTER — Other Ambulatory Visit: Payer: Self-pay | Admitting: Physician Assistant

## 2017-08-25 DIAGNOSIS — I482 Chronic atrial fibrillation, unspecified: Secondary | ICD-10-CM

## 2017-08-25 DIAGNOSIS — I10 Essential (primary) hypertension: Secondary | ICD-10-CM

## 2017-09-08 ENCOUNTER — Ambulatory Visit: Payer: PPO | Admitting: Physician Assistant

## 2017-09-08 NOTE — Progress Notes (Signed)
Cardiology Office Note    Date:  09/09/2017   ID:  [name], DOB 1926-09-02, MRN [id number]  PCP:  Laurey Morale, MD  Cardiologist:  Dr. Aundra Dubin / [name], [facility]  Chief Complaint: Atrial fibrillation 6 months follow up  History of Present Illness:   [name] is a 83 y.o. female with a hx of chronic AF, sinus node dysfunction, chronic diastolic HF, HTN, HLD and mild AS presents for follow up.   She was admitted to the hospital in 4/15 with atrial fibrillation with RVR and acute on chronic diastolic CHF. She was diuresed and digoxin was restarted for rate control. Holter done after restarting digoxin showed average HR 88 (atrial fibrillation) with occasional pauses, longest was 2.9 seconds.  Repeat Holter in 03/2016 demonstrated an elevated HR up to 140, but her avg HR was ok at 89.   She was doing well on cardiac stand point when last seen by [name] 03/10/17. Discontinued Digoxin.  Increased Metoprolol Succinate to 50 mg Twice daily.   Noted elevated HR on 04/14/17 and resumed digoxin. Follow up digoxin level was normal on 05/14/17.   Here today for follow up.  No complaints.  Denies chest pain, shortness of breath, orthopnea, PND, syncope, lower extremity edema, melena or blood in her stool or urine.  Past Medical History:  Diagnosis Date  . Aortic stenosis    a. mild on echo 10/2013  . Blood clotting disorder ([place])   . Bradycardia    a. Holter (1/15):  avg HR 78, 1.4% PVCs, several pauses </= 3 sec, b.  Holter (01/2014):  AFib, freq PVCs, HR 51-127, Avg HR 88, longest pause 2.9 sec - no changes made  . Carotid stenosis    a. Carotid US (4/14):  RICA 40-59%;  b. Carotid US (01/2014) bilateral 1-39%  . Chronic atrial fibrillation (HCC)    a. permanent;  b. chronic coumadin;  c. 10/2013 digoxin and atenolol d/c'd 2/2 pauses of <3 sec;  d. 01/2014 digoxin resumed @ 0.125 mg in setting of difficult to control rate and diast chf.  . Chronic diastolic heart failure  ([place])    a. Echo (6/08):  EF 55-60%, mild MR;  b. Echo (10/2013):  EF 55%, mild AS  . Contact dermatitis and other eczema, due to unspecified cause    a. h/o myalgias with atorvastatin.  . Diverticulosis of colon (without mention of hemorrhage)   . DJD (degenerative joint disease)   . Frequent UTI    Dr. Terance Hart sees pt  . Gout, unspecified   . H/O: CVA (cardiovascular accident) 2008  . History of CVA (cerebrovascular accident)    2008  . History of melanoma    on right ear  . Hyperlipidemia   . Hypertension    a. hx of edema with Amlodipine.  . Hypothyroidism   . Lumbago    chronic low back pain  . Osteoporosis    las DEXA on 01/30/10  . Skin cancer    forehead  . Urticaria, unspecified     Past Surgical History:  Procedure Laterality Date  . ABDOMINAL HYSTERECTOMY    . BAND HEMORRHOIDECTOMY  last on 10-28-11   x 2, per Dr. Deatra Ina   . COLONOSCOPY  07-02-11   Dr. Deatra Ina, adenomatous polyp, no repeats are planned   . FLEXIBLE SIGMOIDOSCOPY  10/28/2011   Procedure: FLEXIBLE SIGMOIDOSCOPY;  Surgeon: Inda Castle, MD;  Location: [facility];  Service: Endoscopy;  Laterality: N/A;  .  GASTRIC VARICES BANDING  10/28/2011   Procedure: HEMORRHOID BANDING;  Surgeon: Inda Castle, MD;  Location: [facility];  Service: Endoscopy;  Laterality: N/A;  . SKIN CANCER EXCISION  2001   removed right ear per Dr. Denna Haggard, skin grafts harvested from right cheek  . TONSILLECTOMY      Current Medications: Prior to Admission medications   Medication Sig Start Date End Date Taking? Authorizing Provider  allopurinol (ZYLOPRIM) 100 MG tablet Take 3 tablets (300 mg total) by mouth daily. 03/25/17   Laurey Morale, MD  digoxin (LANOXIN) 0.125 MG tablet Take 0.5 tablets (0.0625 mg total) by mouth every other day. 05/05/17   Sherren Mocha, MD  diltiazem (CARDIZEM CD) 240 MG 24 hr capsule TAKE ONE CAPSULE BY MOUTH EVERY DAY 01/26/17   Larey Dresser, MD  furosemide (LASIX) 20 MG tablet TAKE 1  TABLET EVERY OTHER DAY (IF WEIGHT INCREASES BY 2 LBS OR MORE IN 1 DAY THEN TAKE 1 TAB DAILY) 07/27/17   [name] T, PA-C  KLOR-CON M10 10 MEQ tablet TAKE 1 TABLET BY MOUTH TWICE A DAY 08/10/16   Larey Dresser, MD  levothyroxine (SYNTHROID, LEVOTHROID) 50 MCG tablet TAKE 1 TABLET (50 MCG TOTAL) BY MOUTH DAILY. 12/25/16   Laurey Morale, MD  metoprolol succinate (TOPROL-XL) 50 MG 24 hr tablet Take 1 tablet (50 mg total) by mouth 2 (two) times daily. Take with or immediately following a meal. 03/11/17 06/09/17  [name] T, PA-C  metoprolol succinate (TOPROL-XL) 50 MG 24 hr tablet Take one (1) tablet (50 mg) by mouth every morning and half (1/2) tablet (25 mg) by mouth every evening. 08/25/17   [name] T, PA-C  oxyCODONE-acetaminophen (PERCOCET/ROXICET) 5-325 MG tablet Take 1 tablet by mouth every 4 (four) hours as needed for moderate pain or severe pain. 03/18/17   Laurey Morale, MD  pravastatin (PRAVACHOL) 10 MG tablet Take one (1) tablet (10 mg) by mouth three (3) times a week (Mondays, Wednesdays, and Fridays). 05/03/17   [name] T, PA-C  warfarin (COUMADIN) 5 MG tablet TAKE 1 TABLET (5 MG TOTAL) BY MOUTH AS DIRECTED. 01/21/17   Larey Dresser, MD    Allergies:   Amlodipine; Amoxicillin; Hydrocodone-acetaminophen; Nitrofurantoin; and Sulfonamide derivatives   Social History   Socioeconomic History  . Marital status: Widowed    Spouse name: None  . Number of children: 3  . Years of education: None  . Highest education level: None  Social Needs  . Financial resource strain: None  . Food insecurity - worry: None  . Food insecurity - inability: None  . Transportation needs - medical: None  . Transportation needs - non-medical: None  Occupational History  . Occupation: retired    Fish farm manager: RETIRED  Tobacco Use  . Smoking status: Former Smoker    Last attempt to quit: 10/05/1994    Years since quitting: 22.9  . Smokeless tobacco: Never Used  Substance and Sexual Activity    . Alcohol use: No    Alcohol/week: 0.0 oz  . Drug use: No  . Sexual activity: No  Other Topics Concern  . None  Social History Narrative  . None     Family History:  The patient's family history includes Anemia in her sister and unknown relative; COPD in her unknown relative; Colon polyps in her mother; Coronary artery disease in her unknown relative; Hypertension in her mother; Immunodeficiency in her unknown relative; Stroke in her father.   ROS:   Please  see the history of present illness.    ROS All other systems reviewed and are negative.   PHYSICAL EXAM:   VS:  BP (!) 150/76   Pulse 76   Resp 16   Ht '5\' 3"'$  (1.6 m)   Wt 84 lb (38.1 kg)   SpO2 90%   BMI 14.88 kg/m    GEN: Elderly frail female in no acute distress  HEENT: normal  Neck: no JVD, carotid bruits, or masses Cardiac: Irregular; no murmurs, rubs, or gallops,no edema  Respiratory:  clear to auscultation bilaterally, normal work of breathing GI: soft, nontender, nondistended, + BS MS: no deformity or atrophy  Skin: warm and dry, no rash Neuro:  Alert and Oriented x 3, Strength and sensation are intact Psych: euthymic mood, full affect  Wt Readings from Last 3 Encounters:  09/09/17 84 lb (38.1 kg)  04/14/17 83 lb (37.6 kg)  03/10/17 82 lb 6.4 oz (37.4 kg)      Studies/Labs Reviewed:   EKG:  EKG is not ordered today.   Recent Labs: 12/17/2016: ALT 6; Hemoglobin 12.1; Platelets 312.0; TSH 0.08 03/10/2017: BUN 8; Creatinine, Ser 0.72; Potassium 4.5; Sodium 139   Lipid Panel    Component Value Date/Time   CHOL 146 12/17/2016 1233   TRIG 87.0 12/17/2016 1233   HDL 38.00 (L) 12/17/2016 1233   CHOLHDL 4 12/17/2016 1233   VLDL 17.4 12/17/2016 1233   LDLCALC 91 12/17/2016 1233   LDLDIRECT 141.5 08/06/2010 0944    Additional studies/ records that were reviewed today include:   Holter 6/17 Sustained atrial fibrillation with some PVCs, HR up to 140 but average rate 89.   Head CT 02/14/16 IMPRESSION:  No acute intracranial findings. Small left frontal scalp contusion. No fracture. Old right MCA territory infarct. Lacunar infarct over the left thalamus/ internal capsule. Mild atrophic change with chronic ischemic microvascular disease.  Carotid US 4/16 Bilateral 1-39% ICA  Holter 4/15 Persistent AFib, Freq PVCs, HR 51-127, avg HR 88, longest pause 2.9 sec  Echo 1/15 EF 55%, no RWMA, probably mild AS, mild AI, mild MR, mild LAE  ASSESSMENT & PLAN:    1. Permanent atrial fibrillation -No recurrent palpitations recently.  Rate stable.  Continue Cardizem and warfarin.   2. Chronic diastolic CHF -Euvolemic.  Continue current regimen.  Check be met today.  3. HTN -Minimally elevated.  Advised to keep a log.  At this age would like to have blood pressure at upper limit.   Medication Adjustments/Labs and Tests Ordered: Current medicines are reviewed at length with the patient today.  Concerns regarding medicines are outlined above.  Medication changes, Labs and Tests ordered today are listed in the Patient Instructions below. Patient Instructions  Medication Instructions: Your physician recommends that you continue on your current medications as directed. Please refer to the Current Medication list given to you today.  Labwork: Your physician has recommended that you have lab work today: BMET  Procedures/Testing: None Ordered  Follow-Up: Your physician wants you to follow-up in: 6 MONTHS with [name], PA-C. You will receive a reminder letter in the mail two months in advance. If you don't receive a letter, please call our office to schedule the follow-up appointment.  If you need a refill on your cardiac medications before your next appointment, please call your pharmacy.      Jarrett Soho, [place]  09/09/2017 2:06 PM    [facility] [place], [place], [place]  [address] Phone: [phone])  [phone]; Fax: [phone]

## 2017-09-09 ENCOUNTER — Ambulatory Visit: Payer: PPO | Admitting: Physician Assistant

## 2017-09-09 ENCOUNTER — Encounter: Payer: Self-pay | Admitting: Physician Assistant

## 2017-09-09 VITALS — BP 150/76 | HR 76 | Resp 16 | Ht 63.0 in | Wt 84.0 lb

## 2017-09-09 DIAGNOSIS — I4821 Permanent atrial fibrillation: Secondary | ICD-10-CM

## 2017-09-09 DIAGNOSIS — Z79899 Other long term (current) drug therapy: Secondary | ICD-10-CM | POA: Diagnosis not present

## 2017-09-09 DIAGNOSIS — I5032 Chronic diastolic (congestive) heart failure: Secondary | ICD-10-CM

## 2017-09-09 DIAGNOSIS — I482 Chronic atrial fibrillation: Secondary | ICD-10-CM | POA: Diagnosis not present

## 2017-09-09 DIAGNOSIS — I10 Essential (primary) hypertension: Secondary | ICD-10-CM

## 2017-09-09 MED ORDER — POTASSIUM CHLORIDE CRYS ER 10 MEQ PO TBCR: 10.0000 meq | EXTENDED_RELEASE_TABLET | Freq: Two times a day (BID) | ORAL | 3 refills | Status: DC

## 2017-09-09 NOTE — Patient Instructions (Signed)
Medication Instructions: Your physician recommends that you continue on your current medications as directed. Please refer to the Current Medication list given to you today.  Labwork: Your physician has recommended that you have lab work today: BMET  Procedures/Testing: None Ordered  Follow-Up: Your physician wants you to follow-up in: 6 MONTHS with [name], PA-C. You will receive a reminder letter in the mail two months in advance. If you don't receive a letter, please call our office to schedule the follow-up appointment.  If you need a refill on your cardiac medications before your next appointment, please call your pharmacy.

## 2017-09-10 LAB — BASIC METABOLIC PANEL
BUN/Creatinine Ratio: 11 — ABNORMAL LOW (ref 12–28)
BUN: 9 mg/dL (ref 8–27)
CO2: 25 mmol/L (ref 20–29)
CREATININE: 0.81 mg/dL (ref 0.57–1.00)
Calcium: 10.3 mg/dL (ref 8.7–10.3)
Chloride: 101 mmol/L (ref 96–106)
GFR calc Af Amer: 74 mL/min/{1.73_m2} (ref >59)
GFR, EST NON AFRICAN AMERICAN: 65 mL/min/{1.73_m2} (ref >59)
GLUCOSE: 85 mg/dL (ref 65–99)
Potassium: 4.4 mmol/L (ref 3.5–5.2)
SODIUM: 141 mmol/L (ref 134–144)

## 2017-09-16 ENCOUNTER — Telehealth: Payer: Self-pay | Admitting: Family Medicine

## 2017-09-16 NOTE — Telephone Encounter (Signed)
Copied from [place]. Topic: Quick Communication - See Telephone Encounter >> Sep 16, 2017  1:10 PM Aurelio Brash B wrote: CRM for notification. See Telephone encounter for:  Refill levothyroxine  [facility] [address] 09/16/17.

## 2017-09-17 ENCOUNTER — Other Ambulatory Visit: Payer: Self-pay | Admitting: *Deleted

## 2017-09-17 ENCOUNTER — Other Ambulatory Visit: Payer: Self-pay | Admitting: Family Medicine

## 2017-09-17 MED ORDER — LEVOTHYROXINE SODIUM 50 MCG PO TABS: 50.0000 ug | ORAL_TABLET | Freq: Every day | ORAL | 1 refills | Status: DC

## 2017-09-22 NOTE — Telephone Encounter (Signed)
This encounter was created in error - please disregard.

## 2017-09-29 ENCOUNTER — Ambulatory Visit (INDEPENDENT_AMBULATORY_CARE_PROVIDER_SITE_OTHER): Payer: PPO | Admitting: *Deleted

## 2017-09-29 DIAGNOSIS — I4821 Permanent atrial fibrillation: Secondary | ICD-10-CM

## 2017-09-29 DIAGNOSIS — Z5181 Encounter for therapeutic drug level monitoring: Secondary | ICD-10-CM

## 2017-09-29 DIAGNOSIS — I482 Chronic atrial fibrillation: Secondary | ICD-10-CM

## 2017-09-29 LAB — POCT INR: INR: 2.6

## 2017-09-29 NOTE — Patient Instructions (Signed)
Description   Continue taking 1/2 pill every day except 1 pill on Mondays, Wednesdays and Fridays.  Recheck INR in 6 weeks. Call if placed on any new medications or scheduled for any procedures [phone]

## 2017-10-08 ENCOUNTER — Ambulatory Visit (INDEPENDENT_AMBULATORY_CARE_PROVIDER_SITE_OTHER): Payer: PPO | Admitting: Family Medicine

## 2017-10-08 ENCOUNTER — Encounter: Payer: Self-pay | Admitting: Family Medicine

## 2017-10-08 VITALS — BP 140/60 | HR 75 | Temp 97.5°F | Wt 86.6 lb

## 2017-10-08 DIAGNOSIS — E039 Hypothyroidism, unspecified: Secondary | ICD-10-CM | POA: Diagnosis not present

## 2017-10-08 DIAGNOSIS — I482 Chronic atrial fibrillation: Secondary | ICD-10-CM | POA: Diagnosis not present

## 2017-10-08 DIAGNOSIS — I10 Essential (primary) hypertension: Secondary | ICD-10-CM | POA: Diagnosis not present

## 2017-10-08 DIAGNOSIS — I5032 Chronic diastolic (congestive) heart failure: Secondary | ICD-10-CM | POA: Diagnosis not present

## 2017-10-08 DIAGNOSIS — R300 Dysuria: Secondary | ICD-10-CM

## 2017-10-08 DIAGNOSIS — I4821 Permanent atrial fibrillation: Secondary | ICD-10-CM

## 2017-10-08 LAB — POC URINALSYSI DIPSTICK (AUTOMATED)
Bilirubin, UA: NEGATIVE
GLUCOSE UA: NEGATIVE
KETONES UA: NEGATIVE
Nitrite, UA: POSITIVE
PH UA: 6 (ref 5.0–8.0)
Spec Grav, UA: 1.015 (ref 1.010–1.025)
Urobilinogen, UA: 1 E.U./dL

## 2017-10-08 NOTE — Progress Notes (Signed)
   Subjective:    Patient ID: [name], female    DOB: 1925-11-26, 84 y.o.   MRN: [id number]  HPI Here to follow up. She feels well except for some mild fatigue. Her BP is stable. Her weight never varies more than a pound or two, and she checks it daily. She had a normal BMET in the cardiology office last month.    Review of Systems  Constitutional: Positive for fatigue. Negative for unexpected weight change.  Respiratory: Negative.   Cardiovascular: Negative.   Gastrointestinal: Negative.   Neurological: Negative.        Objective:   Physical Exam  Constitutional: She appears well-developed and well-nourished.  Frail but alert   Neck: No thyromegaly present.  Cardiovascular: Normal rate, normal heart sounds and intact distal pulses.  Irregular rhythm   Pulmonary/Chest: Effort normal and breath sounds normal. No respiratory distress. She has no wheezes. She has no rales.  Lymphadenopathy:    She has no cervical adenopathy.          Assessment & Plan:  She seems to be doing well. Her HTN and atrial fibrillation are well managed. Check some labs today including a TSH. Alysia Penna, MD

## 2017-10-09 LAB — CBC WITH DIFFERENTIAL/PLATELET
BASOS ABS: 39 {cells}/uL (ref 0–200)
Basophils Relative: 0.7 %
EOS ABS: 129 {cells}/uL (ref 15–500)
Eosinophils Relative: 2.3 %
HCT: 36.1 % (ref 35.0–45.0)
HEMOGLOBIN: 12 g/dL (ref 11.7–15.5)
Lymphs Abs: 2100 cells/uL (ref 850–3900)
MCH: 32.1 pg (ref 27.0–33.0)
MCHC: 33.2 g/dL (ref 32.0–36.0)
MCV: 96.5 fL (ref 80.0–100.0)
MONOS PCT: 9.4 %
MPV: 9.6 fL (ref 7.5–12.5)
NEUTROS ABS: 2806 {cells}/uL (ref 1500–7800)
Neutrophils Relative %: 50.1 %
Platelets: 224 10*3/uL (ref 140–400)
RBC: 3.74 10*6/uL — ABNORMAL LOW (ref 3.80–5.10)
RDW: 12.4 % (ref 11.0–15.0)
TOTAL LYMPHOCYTE: 37.5 %
WBC: 5.6 10*3/uL (ref 3.8–10.8)
WBCMIX: 526 {cells}/uL (ref 200–950)

## 2017-10-09 LAB — HEPATIC FUNCTION PANEL
AG RATIO: 1.2 (calc) (ref 1.0–2.5)
ALKALINE PHOSPHATASE (APISO): 76 U/L (ref 33–130)
ALT: 6 U/L (ref 6–29)
AST: 15 U/L (ref 10–35)
Albumin: 4 g/dL (ref 3.6–5.1)
BILIRUBIN DIRECT: 0.2 mg/dL (ref 0.0–0.2)
BILIRUBIN INDIRECT: 0.3 mg/dL (ref 0.2–1.2)
GLOBULIN: 3.3 g/dL (ref 1.9–3.7)
TOTAL PROTEIN: 7.3 g/dL (ref 6.1–8.1)
Total Bilirubin: 0.5 mg/dL (ref 0.2–1.2)

## 2017-10-09 LAB — TSH: TSH: 0.06 m[IU]/L — AB (ref 0.40–4.50)

## 2017-10-11 LAB — URINE CULTURE
MICRO NUMBER:: 90015203
SPECIMEN QUALITY: ADEQUATE

## 2017-10-13 ENCOUNTER — Other Ambulatory Visit: Payer: Self-pay

## 2017-10-13 MED ORDER — CEPHALEXIN 500 MG PO CAPS: 500.0000 mg | ORAL_CAPSULE | Freq: Three times a day (TID) | ORAL | 0 refills | Status: DC

## 2017-10-13 NOTE — Telephone Encounter (Signed)
Keflex 500 mg TID for 7 days send in for UTI sent to [facility] pt advised Rx was sent.

## 2017-10-15 ENCOUNTER — Encounter: Payer: Self-pay | Admitting: Family Medicine

## 2017-10-18 NOTE — Telephone Encounter (Signed)
Yes the Keflex should help with the upper respiratory problem as well

## 2017-10-19 NOTE — Telephone Encounter (Signed)
Caller name: LEIGH BUSH  Relation to pt: daughter  Call back number: [phone]  Ph[facility]: [id number] [place], [place] [phone] (Phone) [phone] (Fax)     Reason for call:  Daughter states since patient has been taking cephALEXin (KEFLEX) 500 MG capsule patient has been "confused", daughter requesting alternate Rx and would like a call from the nurse, please advise

## 2017-10-20 ENCOUNTER — Encounter: Payer: Self-pay | Admitting: Family Medicine

## 2017-10-20 ENCOUNTER — Ambulatory Visit (INDEPENDENT_AMBULATORY_CARE_PROVIDER_SITE_OTHER): Payer: PPO | Admitting: Family Medicine

## 2017-10-20 VITALS — BP 132/90 | HR 93 | Temp 96.2°F | Wt 81.2 lb

## 2017-10-20 DIAGNOSIS — N390 Urinary tract infection, site not specified: Secondary | ICD-10-CM

## 2017-10-20 DIAGNOSIS — J069 Acute upper respiratory infection, unspecified: Secondary | ICD-10-CM

## 2017-10-20 LAB — POCT URINALYSIS DIPSTICK
Bilirubin, UA: NEGATIVE
KETONES UA: NEGATIVE
NITRITE UA: NEGATIVE
PH UA: 6 (ref 5.0–8.0)
RBC UA: NEGATIVE
Spec Grav, UA: 1.02 (ref 1.010–1.025)
UROBILINOGEN UA: 0.2 U/dL

## 2017-10-20 NOTE — Progress Notes (Signed)
   Subjective:    Patient ID: [name], female    DOB: 12-07-1925, 84 y.o.   MRN: [id number]  HPI Here with her daughter to talk about a UTI and a new URI. She had labs on 10-08-17 which showed a UTI, and she had corresponding burning on urination. The culture grew an E coli and she was started on Keflex. The urine symptoms have resolved at this point and they want to check another urine sample today. However 3 days ago she also developed some stuffy head, PND, and a dry cough. No fever. She is using Robitussin.    Review of Systems     Objective:   Physical Exam  Constitutional: She appears well-developed and well-nourished.  HENT:  Right Ear: External ear normal.  Left Ear: External ear normal.  Nose: Nose normal.  Mouth/Throat: Oropharynx is clear and moist.  Eyes: Conjunctivae are normal.  Neck: No thyromegaly present.  Cardiovascular: Normal rate, regular rhythm, normal heart sounds and intact distal pulses.  Pulmonary/Chest: Effort normal and breath sounds normal. No respiratory distress. She has no wheezes. She has no rales.  Abdominal: Soft. Bowel sounds are normal. She exhibits no distension and no mass. There is no tenderness. There is no rebound and no guarding.  Lymphadenopathy:    She has no cervical adenopathy.          Assessment & Plan:  The UTI is partially treated so I advised her to finish up the Kelfex. She also has a viral URI. She can use Robitussin and drink fluids. Recheck prn.  Alysia Penna, MD

## 2017-10-21 NOTE — Telephone Encounter (Signed)
This was discussed at her OV yesterday

## 2017-10-29 ENCOUNTER — Ambulatory Visit (INDEPENDENT_AMBULATORY_CARE_PROVIDER_SITE_OTHER): Payer: PPO | Admitting: Family Medicine

## 2017-10-29 ENCOUNTER — Encounter: Payer: Self-pay | Admitting: Family Medicine

## 2017-10-29 ENCOUNTER — Other Ambulatory Visit: Payer: Self-pay | Admitting: Cardiology

## 2017-10-29 VITALS — BP 100/62 | HR 55 | Temp 97.5°F | Wt 82.0 lb

## 2017-10-29 DIAGNOSIS — J209 Acute bronchitis, unspecified: Secondary | ICD-10-CM

## 2017-10-29 MED ORDER — HYDROCODONE-HOMATROPINE 5-1.5 MG/5ML PO SYRP: 5.0000 mL | ORAL_SOLUTION | ORAL | 0 refills | Status: DC | PRN

## 2017-10-29 MED ORDER — DOXYCYCLINE HYCLATE 100 MG PO CAPS: 100.0000 mg | ORAL_CAPSULE | Freq: Two times a day (BID) | ORAL | 0 refills | Status: AC

## 2017-10-29 NOTE — Progress Notes (Signed)
   Subjective:    Patient ID: NYRIAH COOTE, female    DOB: May 20, 1926, 84 y.o.   MRN: [id number]  HPI Here for one week of chest congestion and coughing up yellow sputum. No fever. Her UTI symtoms have resolved.    Review of Systems  Constitutional: Negative.   HENT: Negative.   Eyes: Negative.   Respiratory: Positive for cough and chest tightness. Negative for wheezing.   Cardiovascular: Negative.   Genitourinary: Negative.        Objective:   Physical Exam  Constitutional: She appears well-developed and well-nourished.  HENT:  Right Ear: External ear normal.  Left Ear: External ear normal.  Nose: Nose normal.  Mouth/Throat: Oropharynx is clear and moist.  Eyes: Conjunctivae are normal.  Neck: No thyromegaly present.  Cardiovascular: Normal rate, regular rhythm, normal heart sounds and intact distal pulses.  Pulmonary/Chest: Effort normal and breath sounds normal. No respiratory distress. She has no wheezes. She has no rales.  Lymphadenopathy:    She has no cervical adenopathy.          Assessment & Plan:  Bronchitis, treat with Doxycycline.  Alysia Penna, MD

## 2017-11-09 ENCOUNTER — Encounter: Payer: Self-pay | Admitting: Family Medicine

## 2017-11-09 ENCOUNTER — Ambulatory Visit (INDEPENDENT_AMBULATORY_CARE_PROVIDER_SITE_OTHER)
Admission: RE | Admit: 2017-11-09 | Discharge: 2017-11-09 | Disposition: A | Discharge status: Home / Self Care | Payer: PPO | Source: Ambulatory Visit | Attending: Family Medicine | Admitting: Family Medicine

## 2017-11-09 ENCOUNTER — Ambulatory Visit (INDEPENDENT_AMBULATORY_CARE_PROVIDER_SITE_OTHER): Payer: PPO | Admitting: Family Medicine

## 2017-11-09 VITALS — BP 106/64 | HR 63 | Temp 97.6°F | Wt 78.6 lb

## 2017-11-09 DIAGNOSIS — J189 Pneumonia, unspecified organism: Secondary | ICD-10-CM

## 2017-11-09 DIAGNOSIS — R05 Cough: Secondary | ICD-10-CM | POA: Diagnosis not present

## 2017-11-09 MED ORDER — CEFTRIAXONE SODIUM 1 G IJ SOLR
1.0000 g | Freq: Once | INTRAMUSCULAR | Status: AC
  Administered 2017-11-09: 1 g via INTRAMUSCULAR

## 2017-11-09 NOTE — Progress Notes (Signed)
   Subjective:    Patient ID: [name], female    DOB: 1925/11/02, 84 y.o.   MRN: [id number]  HPI Here with her daughter for continued coughing up green sputum and generalized weakness. She is drinking fluids but her appetite is poor. No fever. No chest pain or SOB. She just finished a course of Doxycycline but this has not made much of a difference.    Review of Systems  Constitutional: Negative for chills, diaphoresis and fever.  Respiratory: Positive for cough. Negative for shortness of breath and wheezing.   Cardiovascular: Negative.   Gastrointestinal: Negative.   Genitourinary: Negative.   Neurological: Negative.        Objective:   Physical Exam  Constitutional: She is oriented to person, place, and time.  Frail, weak. Alert   Neck: No thyromegaly present.  Cardiovascular: Normal rate, regular rhythm, normal heart sounds and intact distal pulses.  Pulmonary/Chest: Effort normal. She has no wheezes.  There are rales in the left upper lobe   Musculoskeletal: She exhibits no edema.  Lymphadenopathy:    She has no cervical adenopathy.  Neurological: She is alert and oriented to person, place, and time.          Assessment & Plan:  Probable pneumonia. She is given a shot of Rocephin. Her daughter will take her to our [place] site now for a CXR. We will proceed based on that report.  Alysia Penna, MD

## 2017-11-09 NOTE — Addendum Note (Signed)
Addended by: Myriam Forehand on: 11/09/2017 12:15 PM   Modules accepted: Orders

## 2017-11-10 ENCOUNTER — Ambulatory Visit (INDEPENDENT_AMBULATORY_CARE_PROVIDER_SITE_OTHER): Payer: PPO | Admitting: *Deleted

## 2017-11-10 ENCOUNTER — Other Ambulatory Visit: Payer: Self-pay

## 2017-11-10 DIAGNOSIS — I482 Chronic atrial fibrillation: Secondary | ICD-10-CM

## 2017-11-10 DIAGNOSIS — Z5181 Encounter for therapeutic drug level monitoring: Secondary | ICD-10-CM

## 2017-11-10 DIAGNOSIS — I4821 Permanent atrial fibrillation: Secondary | ICD-10-CM

## 2017-11-10 LAB — POCT INR: INR: 5.6

## 2017-11-10 MED ORDER — LEVOFLOXACIN 500 MG PO TABS: 500.0000 mg | ORAL_TABLET | Freq: Every day | ORAL | 0 refills | Status: DC

## 2017-11-10 NOTE — Telephone Encounter (Signed)
Levaquin 500 mg daily for 10 days  Rx has been sent

## 2017-11-10 NOTE — Patient Instructions (Signed)
Description   Do not take any Coumadin Thursday, Friday, and Saturday then continue taking 1/2 pill every day except 1 pill on Mondays, Wednesdays and Fridays.  Recheck INR in 1 week. Call if placed on any new medications or scheduled for any procedures [phone]

## 2017-11-17 ENCOUNTER — Ambulatory Visit (INDEPENDENT_AMBULATORY_CARE_PROVIDER_SITE_OTHER): Payer: PPO | Admitting: Pharmacist

## 2017-11-17 DIAGNOSIS — Z5181 Encounter for therapeutic drug level monitoring: Secondary | ICD-10-CM | POA: Diagnosis not present

## 2017-11-17 DIAGNOSIS — I4821 Permanent atrial fibrillation: Secondary | ICD-10-CM

## 2017-11-17 DIAGNOSIS — I482 Chronic atrial fibrillation: Secondary | ICD-10-CM

## 2017-11-17 LAB — POCT INR: INR: 3.8

## 2017-11-17 NOTE — Patient Instructions (Signed)
Description   Skip your Coumadin today, then take 1/2 tablet daily for the next 3 days while you finish Levaquin. After, continue taking 1/2 pill every day except 1 pill on Mondays, Wednesdays and Fridays.  Recheck INR in 2 weeks. Call if placed on any new medications or scheduled for any procedures [phone]

## 2017-11-22 ENCOUNTER — Ambulatory Visit (INDEPENDENT_AMBULATORY_CARE_PROVIDER_SITE_OTHER): Payer: PPO | Admitting: Family Medicine

## 2017-11-22 ENCOUNTER — Encounter: Payer: Self-pay | Admitting: Family Medicine

## 2017-11-22 VITALS — BP 102/62 | HR 69 | Temp 97.8°F | Wt 82.3 lb

## 2017-11-22 DIAGNOSIS — J189 Pneumonia, unspecified organism: Secondary | ICD-10-CM

## 2017-11-22 DIAGNOSIS — J181 Lobar pneumonia, unspecified organism: Secondary | ICD-10-CM

## 2017-11-22 DIAGNOSIS — J439 Emphysema, unspecified: Secondary | ICD-10-CM | POA: Diagnosis not present

## 2017-11-22 MED ORDER — MEGESTROL ACETATE 400 MG/10ML PO SUSP: 400.0000 mg | Freq: Three times a day (TID) | ORAL | 5 refills | Status: DC

## 2017-11-22 NOTE — Progress Notes (Signed)
   Subjective:    Patient ID: [name], female    DOB: Dec 30, 1925, 84 y.o.   MRN: [id number]  HPI Here to follow up a CAP. She was seen recently for weakness , SOB, and a cough. On exam we heard rales in the left upper lobe. A CXR did not show a discrete pneumonia but do show emphysema. She took a course of Levaquin, and today she feels much better. She is still weak but this has improved. The cough and SOB are resolved. No fever. Her appetite remains very low. She is taking several  Ensure shakes a day.    Review of Systems  Constitutional: Negative.   Respiratory: Negative.   Cardiovascular: Negative.   Gastrointestinal: Negative.   Genitourinary: Negative.   Neurological: Negative.        Objective:   Physical Exam  Constitutional: She is oriented to person, place, and time. She appears well-developed and well-nourished.  Neck: No thyromegaly present.  Cardiovascular: Normal rate, regular rhythm, normal heart sounds and intact distal pulses.  Pulmonary/Chest: Effort normal and breath sounds normal. No respiratory distress. She has no wheezes. She has no rales.  Lymphadenopathy:    She has no cervical adenopathy.  Neurological: She is alert and oriented to person, place, and time.          Assessment & Plan:  Her CAP has resolved. Her COPD seems to be in the early stages at this point. We will continue to monitor this. She will try Megace to boost her appetite.  Alysia Penna, MD

## 2017-12-01 ENCOUNTER — Ambulatory Visit (INDEPENDENT_AMBULATORY_CARE_PROVIDER_SITE_OTHER): Payer: PPO | Admitting: *Deleted

## 2017-12-01 DIAGNOSIS — Z5181 Encounter for therapeutic drug level monitoring: Secondary | ICD-10-CM | POA: Diagnosis not present

## 2017-12-01 DIAGNOSIS — I4821 Permanent atrial fibrillation: Secondary | ICD-10-CM

## 2017-12-01 DIAGNOSIS — I482 Chronic atrial fibrillation: Secondary | ICD-10-CM | POA: Diagnosis not present

## 2017-12-01 LAB — POCT INR: INR: 4.5

## 2017-12-01 NOTE — Patient Instructions (Addendum)
Description   Skip today and tomorrow's dose, then change your dose to 1/2 tablet everyday except 1 tablet on Tuesdays and Saturdays.   Recheck INR in 2 weeks. Call if placed on any new medications or scheduled for any procedures [phone]

## 2017-12-10 ENCOUNTER — Telehealth: Payer: Self-pay | Admitting: *Deleted

## 2017-12-10 NOTE — Telephone Encounter (Signed)
Prior auth for Megace sent to [email or web address].

## 2017-12-15 ENCOUNTER — Ambulatory Visit (INDEPENDENT_AMBULATORY_CARE_PROVIDER_SITE_OTHER): Payer: PPO | Admitting: *Deleted

## 2017-12-15 ENCOUNTER — Telehealth: Payer: Self-pay | Admitting: Physician Assistant

## 2017-12-15 DIAGNOSIS — Z5181 Encounter for therapeutic drug level monitoring: Secondary | ICD-10-CM

## 2017-12-15 DIAGNOSIS — I4821 Permanent atrial fibrillation: Secondary | ICD-10-CM

## 2017-12-15 DIAGNOSIS — I482 Chronic atrial fibrillation: Secondary | ICD-10-CM | POA: Diagnosis not present

## 2017-12-15 LAB — POCT INR: INR: 2.4

## 2017-12-15 NOTE — Telephone Encounter (Signed)
New Message     Pt c/o swelling: STAT is pt has developed SOB within 24 hours  1) How much weight have you gained and in what time span? no  2) If swelling, where is the swelling located? Legs and ankles   3) Are you currently taking a fluid pill? Yes they increased the lasik to everyday , instead of every other day   4) Are you currently SOB?  no  5) Do you have a log of your daily weights (if so, list)? no  6) Have you gained 3 pounds in a day or 5 pounds in a week? no  7) Have you traveled recently? no

## 2017-12-15 NOTE — Patient Instructions (Signed)
Description   Continue taking  1/2 tablet everyday except 1 tablet on Tuesdays and Saturdays.   Recheck INR in 2 weeks. Call if placed on any new medications or scheduled for any procedures [phone]

## 2017-12-20 NOTE — Telephone Encounter (Signed)
I called the pt to check on her. She states she is feeling a little better and swelling is getting somewhat better. I advised pt let us move her appt up sooner. Pt is agreeable. Pt has been.

## 2017-12-20 NOTE — Telephone Encounter (Signed)
Pt has been scheduled to see [name], PA 3/26 @ 11:45.

## 2017-12-28 ENCOUNTER — Ambulatory Visit (INDEPENDENT_AMBULATORY_CARE_PROVIDER_SITE_OTHER): Payer: PPO | Admitting: *Deleted

## 2017-12-28 ENCOUNTER — Encounter: Payer: Self-pay | Admitting: Physician Assistant

## 2017-12-28 ENCOUNTER — Ambulatory Visit: Payer: PPO | Admitting: Physician Assistant

## 2017-12-28 VITALS — BP 140/70 | HR 86 | Ht 63.0 in | Wt 83.2 lb

## 2017-12-28 DIAGNOSIS — I4821 Permanent atrial fibrillation: Secondary | ICD-10-CM

## 2017-12-28 DIAGNOSIS — R42 Dizziness and giddiness: Secondary | ICD-10-CM | POA: Diagnosis not present

## 2017-12-28 DIAGNOSIS — Z5181 Encounter for therapeutic drug level monitoring: Secondary | ICD-10-CM | POA: Diagnosis not present

## 2017-12-28 DIAGNOSIS — I5032 Chronic diastolic (congestive) heart failure: Secondary | ICD-10-CM

## 2017-12-28 DIAGNOSIS — I482 Chronic atrial fibrillation: Secondary | ICD-10-CM

## 2017-12-28 DIAGNOSIS — I10 Essential (primary) hypertension: Secondary | ICD-10-CM

## 2017-12-28 LAB — POCT INR: INR: 2.2

## 2017-12-28 NOTE — Patient Instructions (Addendum)
Medication Instructions:  You can take the Lasix (Furosemide) about 6 hours apart. For 2 days, take Lasix (Furosemide) 20 mg 2 tablets (40 mg) in the morning and 20 mg in the afternoon (1 extra tablet) If you need to take an extra Lasix for 3 days, that is ok After 2-3 days, go back to taking Lasix 20 mg 1 tablet twice daily.  Labwork: None   Testing/Procedures: None   Follow-Up: [name], PA-C in 1 month. ON January 28, 2018 @ 12:15   Any Other Special Instructions Will Be Listed Below (If Applicable).  If you need a refill on your cardiac medications before your next appointment, please call your pharmacy.

## 2017-12-28 NOTE — Progress Notes (Signed)
Cardiology Office Note:    Date:  12/28/2017   ID:  [name], DOB [birth year], MRN [id number]  PCP:  Christy Morale, MD  Cardiologist:  Previously Dr. [name] / [name], PA-C    Referring MD: Christy Morale, MD   Chief Complaint  Patient presents with  . Leg Swelling    History of Present Illness:    [name] is a 84 y.o. female with chronic AF, sinus node dysfunction, chronic diastolic HF. She was admitted to the hospital in 4/15 with atrial fibrillation with RVR and acute on chronic diastolic CHF. She was diuresed and digoxin was restarted for rate control. Holter done after restarting digoxin showed average HR 88 (atrial fibrillation) with occasional pauses, longest was 2.9 seconds.  Repeat Holter in 03/2016 demonstrated an elevated HR up to 140, but her avg HR was ok at 89.  Last seen in 09/2017 with Christy Kail, PA-C.  She was treated for community-acquired pneumonia by her PCP in Feb 2019.    [name] returns for evaluation of leg swelling.  She has noted this for the past 2 weeks.  She has not noticed any significant weight gain or worsening shortness of breath.  She denies orthopnea, paroxysmal nocturnal dyspnea, chest pain.  She has been somewhat lightheaded with turning her head or standing.  She denies near syncope or syncope.    Prior CV studies:   The following studies were reviewed today:  Holter 6/17 Sustained atrial fibrillation with some PVCs, HR up to 140 but average rate 89.   Head CT 02/14/16 IMPRESSION: No acute intracranial findings. Small left frontal scalp contusion. No fracture. Old right MCA territory infarct. Lacunar infarct over the left thalamus/ internal capsule. Mild atrophic change with chronic ischemic microvascular disease.  Carotid US 4/16 Bilateral 1-39% ICA  Holter 4/15 Persistent AFib, Freq PVCs, HR 51-127, avg HR 88, longest pause 2.9 sec  Echo 1/15 EF 55%, no RWMA, probably mild AS, mild AI, mild  MR, mild LAE  Past Medical History:  Diagnosis Date  . Aortic stenosis    a. mild on echo 10/2013  . Blood clotting disorder ([place])   . Bradycardia    a. Holter (1/15):  avg HR 78, 1.4% PVCs, several pauses </= 3 sec, b.  Holter (01/2014):  AFib, freq PVCs, HR 51-127, Avg HR 88, longest pause 2.9 sec - no changes made  . Carotid stenosis    a. Carotid US (4/14):  RICA 40-59%;  b. Carotid US (01/2014) bilateral 1-39%  . Chronic atrial fibrillation ([facility])    a. permanent;  b. chronic coumadin;  c. 10/2013 digoxin and atenolol d/c'd 2/2 pauses of <3 sec;  d. 01/2014 digoxin resumed @ 0.125 mg in setting of difficult to control rate and diast chf.  . Chronic diastolic heart failure ([place])    a. Echo (6/08):  EF 55-60%, mild MR;  b. Echo (10/2013):  EF 55%, mild AS  . Contact dermatitis and other eczema, due to unspecified cause    a. h/o myalgias with atorvastatin.  . Diverticulosis of colon (without mention of hemorrhage)   . DJD (degenerative joint disease)   . Frequent UTI    Dr. Terance Hart sees pt  . Gout, unspecified   . H/O: CVA (cardiovascular accident) 2008  . History of CVA (cerebrovascular accident)    2008  . History of melanoma    on right ear  . Hyperlipidemia   . Hypertension    a. hx  of edema with Amlodipine.  . Hypothyroidism   . Lumbago    chronic low back pain  . Osteoporosis    las DEXA on 01/30/10  . Skin cancer    forehead  . Urticaria, unspecified   1. Chronic atrial fibrillation 2. Gout 3. Diastolic CHF: Echo (2/58) with EF 55-60%, mild MR.Echo (1/15) with EF 55%, probably at least mild AS.  4. HTN: Edema with amlodipine . 5. Hyperlipidemia: myalgias with atorvastatin 6. H/o UTIs  7. Chronic low back pain.  8. Hypothyroidism 9. CVA in 2008.  10.OA 11. Diverticulosis 12. Osteoporosis 13. H/o melanoma 14. Carotid stenosis: Dopplers (4/14) with 40-59% RICA stenosis (has been stable).  15. Bradycardia: Holter (1/15) with average HR 78, 1.4% PVCs,  several pauses </= 3 seconds.Holter (4/15) with atrial fibrillation, average HR 88, frequent PVCs, longest pause 2.9 sec 16. Aortic stenosis: At least mild on 1/15 echo.    Surgical Hx: The patient  has a past surgical history that includes Skin cancer excision (2001); Abdominal hysterectomy; Tonsillectomy; Colonoscopy (07-02-11); Band hemorrhoidectomy (last on 10-28-11); Flexible sigmoidoscopy (10/28/2011); and gastric varices banding (10/28/2011).   Current Medications: Current Meds  Medication Sig  . allopurinol (ZYLOPRIM) 100 MG tablet Take 3 tablets (300 mg total) by mouth daily.  . digoxin (LANOXIN) 0.125 MG tablet Take 0.5 tablets (0.0625 mg total) by mouth every other day.  . diltiazem (CARDIZEM CD) 240 MG 24 hr capsule TAKE ONE CAPSULE BY MOUTH EVERY DAY  . furosemide (LASIX) 20 MG tablet TAKE 1 TABLET BY MOUTH IN THE MORNING AND 1 TABLET BY MOUTH IN THE EVENING  . HYDROcodone-homatropine (HYDROMET) 5-1.5 MG/5ML syrup Take 5 mLs by mouth every 4 (four) hours as needed.  [name] levothyroxine (SYNTHROID, LEVOTHROID) 50 MCG tablet Take 1 tablet (50 mcg total) by mouth daily.  . megestrol (MEGACE) 400 MG/10ML suspension Take 10 mLs (400 mg total) by mouth 3 (three) times daily.  [name] oxyCODONE-acetaminophen (PERCOCET/ROXICET) 5-325 MG tablet Take 1 tablet by mouth every 4 (four) hours as needed for moderate pain or severe pain.  . potassium chloride (KLOR-CON M10) 10 MEQ tablet Take 1 tablet (10 mEq total) by mouth 2 (two) times daily.  . pravastatin (PRAVACHOL) 10 MG tablet Take one (1) tablet (10 mg) by mouth three (3) times a week (Mondays, Wednesdays, and Fridays).  . warfarin (COUMADIN) 5 MG tablet TAKE 1 TABLET (5 MG TOTAL) BY MOUTH AS DIRECTED.     Allergies:   Amlodipine; Amoxicillin; Hydrocodone-acetaminophen; Nitrofurantoin; and Sulfonamide derivatives   Social History   Tobacco Use  . Smoking status: Former Smoker    Last attempt to quit: 10/05/1994    Years since quitting: 23.2  .  Smokeless tobacco: Never Used  Substance Use Topics  . Alcohol use: No    Alcohol/week: 0.0 oz  . Drug use: No     Family Hx: The patient's family history includes Anemia in her sister and unknown relative; COPD in her unknown relative; Colon polyps in her mother; Coronary artery disease in her unknown relative; Hypertension in her mother; Immunodeficiency in her unknown relative; Stroke in her father. There is no history of Colon cancer or Heart attack.  ROS:   Please see the history of present illness.    Review of Systems  Cardiovascular: Positive for leg swelling.   All other systems reviewed and are negative.   EKGs/Labs/Other Test Reviewed:    EKG:  EKG is not ordered today.    Recent Labs: 09/09/2017: BUN 9; Creatinine, Ser  0.81; Potassium 4.4; Sodium 141 10/08/2017: ALT 6; Hemoglobin 12.0; Platelets 224; TSH 0.06   Recent Lipid Panel Lab Results  Component Value Date/Time   CHOL 146 12/17/2016 12:33 PM   TRIG 87.0 12/17/2016 12:33 PM   HDL 38.00 (L) 12/17/2016 12:33 PM   CHOLHDL 4 12/17/2016 12:33 PM   LDLCALC 91 12/17/2016 12:33 PM   LDLDIRECT 141.5 08/06/2010 09:44 AM    Physical Exam:    VS:  BP 140/70   Pulse 86   Ht 5\' 3"  (1.6 m)   Wt 83 lb 3.2 oz (37.7 kg)   SpO2 98%   BMI 14.74 kg/m     Wt Readings from Last 3 Encounters:  12/28/17 83 lb 3.2 oz (37.7 kg)  11/22/17 82 lb 4.8 oz (37.3 kg)  11/09/17 78 lb 9.6 oz (35.7 kg)     Physical Exam  Constitutional: She is oriented to person, place, and time. She appears well-developed and well-nourished. No distress.  HENT:  Head: Normocephalic and atraumatic.  Neck: No JVD present.  Cardiovascular: Normal rate. An irregularly irregular rhythm present.  No murmur heard. Pulmonary/Chest: She has no rales.  Abdominal: Soft.  Musculoskeletal: She exhibits edema (trace-1+ bilat LE edema).  Neurological: She is alert and oriented to person, place, and time.  Skin: Skin is warm and dry.    ASSESSMENT &  PLAN:    #1.  Chronic diastolic CHF (congestive heart failure) ([place]) She notes lower extremity edema for the past 2 weeks.  Otherwise, she does not appear to be significantly volume overloaded.  Her weights have been fairly stable.  She had recent prolonged illness this winter with UTI and then pneumonia.  I have suggested that she take Lasix 40 mg in the morning and 20 mg in the afternoon for 2-3 days.  She should then resume Lasix 20 mg twice daily.  Follow-up with me in 1 month.  #2.  Permanent atrial fibrillation ([place]) Rate seems to be well controlled.  She seems to be tolerating Coumadin.  Continue current therapy.  #3.  Essential hypertension Borderline elevation.  Continue to monitor for now.  #4.  Dizziness Question if this is related to deconditioning from her recent illnesses.  Continue to monitor for now.  She knows to return for sooner follow-up if this should worsen.   Dispo:  Return in about 1 month (around 01/28/2018) for Close Follow Up, w/ [name], PA-C.   Medication Adjustments/Labs and Tests Ordered: Current medicines are reviewed at length with the patient today.  Concerns regarding medicines are outlined above.  Tests Ordered: No orders of the defined types were placed in this encounter.  Medication Changes: No orders of the defined types were placed in this encounter.   Signed, [name], PA-C  12/28/2017 3:50 PM    [facility] [place], [place], [place]  [address] Phone: [phone]; Fax: [phone]

## 2017-12-28 NOTE — Patient Instructions (Signed)
Description   Continue taking  1/2 tablet everyday except 1 tablet on Tuesdays and Saturdays.   Recheck INR in 3 weeks. Call if placed on any new medications or scheduled for any procedures [phone]

## 2018-01-02 ENCOUNTER — Other Ambulatory Visit (HOSPITAL_COMMUNITY): Payer: Self-pay | Admitting: Cardiology

## 2018-01-03 ENCOUNTER — Ambulatory Visit: Payer: PPO | Admitting: Physician Assistant

## 2018-01-14 ENCOUNTER — Ambulatory Visit: Payer: PPO | Admitting: *Deleted

## 2018-01-14 DIAGNOSIS — Z5181 Encounter for therapeutic drug level monitoring: Secondary | ICD-10-CM

## 2018-01-14 DIAGNOSIS — I482 Chronic atrial fibrillation: Secondary | ICD-10-CM

## 2018-01-14 DIAGNOSIS — I4821 Permanent atrial fibrillation: Secondary | ICD-10-CM

## 2018-01-14 LAB — POCT INR: INR: 1.9

## 2018-01-14 NOTE — Patient Instructions (Signed)
Description   Today April 12th take 1 tabket then continue taking  1/2 tablet everyday except 1 tablet on Tuesdays and Saturdays.   Recheck INR in 3 weeks. Call if placed on any new medications or scheduled for any procedures [phone]

## 2018-01-19 ENCOUNTER — Encounter: Payer: Self-pay | Admitting: Physician Assistant

## 2018-01-21 ENCOUNTER — Other Ambulatory Visit: Payer: Self-pay | Admitting: Physician Assistant

## 2018-01-21 DIAGNOSIS — I482 Chronic atrial fibrillation, unspecified: Secondary | ICD-10-CM

## 2018-01-21 DIAGNOSIS — I10 Essential (primary) hypertension: Secondary | ICD-10-CM

## 2018-01-25 ENCOUNTER — Telehealth: Payer: Self-pay | Admitting: *Deleted

## 2018-01-25 NOTE — Telephone Encounter (Signed)
The medication is not on the most recent office visit note.Plz advise.Christy KitchenMarland KitchenBN

## 2018-01-25 NOTE — Telephone Encounter (Signed)
I s/w pt and her daughter [name] to confirm the dose of Toprol XL pt should be taking. Advised Rx came over with the directions to take 50 mg in the AM and 25 mg in the PM. I reviewed the chart and saw that this was an old dosage from 03/11/17. Pt was changed to Toprol XL 50 mg BID. I confirmed with the pt's daughter [name] pt is taking Toprol XL 50 mg BID. I thanked pt's daughter for her help. I have sent in refill with the correct directions Toprol XL 50 mg take 1 tablet twice daily # 180 x 3. Pt has appt with [name], PA 01/28/18. Both the pt and her daughter thanked me for the call and making sure the Rx was correct. Daughter said she may have picked up an old bottle and called Rx in.

## 2018-01-28 ENCOUNTER — Encounter: Payer: Self-pay | Admitting: Physician Assistant

## 2018-01-28 ENCOUNTER — Ambulatory Visit: Payer: PPO | Admitting: Physician Assistant

## 2018-01-28 VITALS — BP 134/88 | HR 97 | Ht 63.0 in | Wt 88.6 lb

## 2018-01-28 DIAGNOSIS — I482 Chronic atrial fibrillation: Secondary | ICD-10-CM | POA: Diagnosis not present

## 2018-01-28 DIAGNOSIS — I4821 Permanent atrial fibrillation: Secondary | ICD-10-CM

## 2018-01-28 DIAGNOSIS — I5032 Chronic diastolic (congestive) heart failure: Secondary | ICD-10-CM

## 2018-01-28 NOTE — Progress Notes (Signed)
Cardiology Office Note:    Date:  01/28/2018   ID:  [name], DOB 1926-08-11, MRN [id number]  PCP:  Laurey Morale, MD  Cardiologist:  Dorris Carnes, MD / [name], PA-C   Referring MD: Laurey Morale, MD   Chief Complaint  Patient presents with  . Follow-up    AFib, CHF    History of Present Illness:    [name] is a 84 y.o. female with chronic AF, sinus node dysfunction, chronic diastolic HF. She was admitted to the hospital in 4/15 with atrial fibrillation with RVR and acute on chronic diastolic CHF. She was diuresed and digoxin was restarted for rate control. Holter done after restarting digoxin showed average HR 88 (atrial fibrillation) with occasional pauses, longest was 2.9 seconds. Repeat Holter in 03/2016 demonstrated anelevated HR up to 140, but her avg HR was ok at 89.  Last seen 12/2017.  Her Lasix was adjusted for leg swelling.    [name] returns for follow up.  She is here with her daughter, [name].  She has been doing well.  She denies chest pain, significant shortness of breath, syncope.  Her leg swelling is improved.    Prior CV studies:   The following studies were reviewed today:  Holter 6/17 Sustained atrial fibrillation with some PVCs, HR up to 140 but average rate 89.   Head CT 02/14/16 IMPRESSION: No acute intracranial findings. Small left frontal scalp contusion. No fracture. Old right MCA territory infarct. Lacunar infarct over the left thalamus/ internal capsule. Mild atrophic change with chronic ischemic microvascular disease.  Carotid US 4/16 Bilateral 1-39% ICA  Holter 4/15 Persistent AFib, Freq PVCs, HR 51-127, avg HR 88, longest pause 2.9 sec  Echo 1/15 EF 55%, no RWMA, probably mild AS, mild AI, mild MR, mild LAE  Past Medical History:  Diagnosis Date  . Aortic stenosis    a. mild on echo 10/2013  . Blood clotting disorder ([place])   . Bradycardia    a. Holter (1/15):  avg HR 78, 1.4% PVCs, several pauses </= 3 sec,  b.  Holter (01/2014):  AFib, freq PVCs, HR 51-127, Avg HR 88, longest pause 2.9 sec - no changes made  . Carotid stenosis    a. Carotid US (4/14):  RICA 40-59%;  b. Carotid US (01/2014) bilateral 1-39%  . Chronic atrial fibrillation (HCC)    a. permanent;  b. chronic coumadin;  c. 10/2013 digoxin and atenolol d/c'd 2/2 pauses of <3 sec;  d. 01/2014 digoxin resumed @ 0.125 mg in setting of difficult to control rate and diast chf.  . Chronic diastolic heart failure ([place])    a. Echo (6/08):  EF 55-60%, mild MR;  b. Echo (10/2013):  EF 55%, mild AS  . Contact dermatitis and other eczema, due to unspecified cause    a. h/o myalgias with atorvastatin.  . Diverticulosis of colon (without mention of hemorrhage)   . DJD (degenerative joint disease)   . Frequent UTI    Dr. Terance Hart sees pt  . Gout, unspecified   . H/O: CVA (cardiovascular accident) 2008  . History of CVA (cerebrovascular accident)    2008  . History of melanoma    on right ear  . Hyperlipidemia   . Hypertension    a. hx of edema with Amlodipine.  . Hypothyroidism   . Lumbago    chronic low back pain  . Osteoporosis    las DEXA on 01/30/10  . Skin cancer  forehead  . Urticaria, unspecified   1. Chronic atrial fibrillation 2. Gout 3. Diastolic CHF: Echo (7/98) with EF 55-60%, mild MR.Echo (1/15) with EF 55%, probably at least mild AS.  4. HTN: Edema with amlodipine . 5. Hyperlipidemia: myalgias with atorvastatin 6. H/o UTIs  7. Chronic low back pain.  8. Hypothyroidism 9. CVA in 2008.  10.OA 11. Diverticulosis 12. Osteoporosis 13. H/o melanoma 14. Carotid stenosis: Dopplers (4/14) with 40-59% RICA stenosis (has been stable).  15. Bradycardia: Holter (1/15) with average HR 78, 1.4% PVCs, several pauses </= 3 seconds.Holter (4/15) with atrial fibrillation, average HR 88, frequent PVCs, longest pause 2.9 sec 16. Aortic stenosis: At least mild on 1/15 echo.   Surgical Hx: The patient  has a past surgical  history that includes Skin cancer excision (2001); Abdominal hysterectomy; Tonsillectomy; Colonoscopy (07-02-11); Band hemorrhoidectomy (last on 10-28-11); Flexible sigmoidoscopy (10/28/2011); and gastric varices banding (10/28/2011).   Current Medications: Current Meds  Medication Sig  . allopurinol (ZYLOPRIM) 100 MG tablet Take 100 mg by mouth daily.  . digoxin (LANOXIN) 0.125 MG tablet Take 0.5 tablets (0.0625 mg total) by mouth every other day.  . diltiazem (CARDIZEM CD) 240 MG 24 hr capsule TAKE ONE CAPSULE BY MOUTH EVERY DAY  . furosemide (LASIX) 20 MG tablet Take 20 mg by mouth every other day.   . levothyroxine (SYNTHROID, LEVOTHROID) 50 MCG tablet Take 1 tablet (50 mcg total) by mouth daily.  . metoprolol succinate (TOPROL-XL) 50 MG 24 hr tablet Take 1 tablet (50 mg total) by mouth 2 (two) times daily.  [name] oxyCODONE-acetaminophen (PERCOCET/ROXICET) 5-325 MG tablet Take 1 tablet by mouth every 4 (four) hours as needed for moderate pain or severe pain.  . potassium chloride (KLOR-CON M10) 10 MEQ tablet Take 1 tablet (10 mEq total) by mouth 2 (two) times daily.  . pravastatin (PRAVACHOL) 10 MG tablet Take one (1) tablet (10 mg) by mouth three (3) times a week (Mondays, Wednesdays, and Fridays).  . warfarin (COUMADIN) 5 MG tablet TAKE 1 TABLET (5 MG TOTAL) BY MOUTH AS DIRECTED.  . [DISCONTINUED] allopurinol (ZYLOPRIM) 100 MG tablet Take 3 tablets (300 mg total) by mouth daily.     Allergies:   Amlodipine; Amoxicillin; Hydrocodone-acetaminophen; Nitrofurantoin; and Sulfonamide derivatives   Social History   Tobacco Use  . Smoking status: Former Smoker    Last attempt to quit: 10/05/1994    Years since quitting: 23.3  . Smokeless tobacco: Never Used  Substance Use Topics  . Alcohol use: No    Alcohol/week: 0.0 oz  . Drug use: No     Family Hx: The patient's family history includes Anemia in her sister and unknown relative; COPD in her unknown relative; Colon polyps in her mother;  Coronary artery disease in her unknown relative; Hypertension in her mother; Immunodeficiency in her unknown relative; Stroke in her father. There is no history of Colon cancer or Heart attack.  ROS:   Please see the history of present illness.    ROS All other systems reviewed and are negative.   EKGs/Labs/Other Test Reviewed:    EKG:  EKG is  ordered today.  The ekg ordered today demonstrates atrial fibrillation, HR 97  Recent Labs: 09/09/2017: BUN 9; Creatinine, Ser 0.81; Potassium 4.4; Sodium 141 10/08/2017: ALT 6; Hemoglobin 12.0; Platelets 224; TSH 0.06   Recent Lipid Panel Lab Results  Component Value Date/Time   CHOL 146 12/17/2016 12:33 PM   TRIG 87.0 12/17/2016 12:33 PM   HDL 38.00 (L) 12/17/2016 12:33  PM   CHOLHDL 4 12/17/2016 12:33 PM   LDLCALC 91 12/17/2016 12:33 PM   LDLDIRECT 141.5 08/06/2010 09:44 AM    Physical Exam:    VS:  BP 134/88   Pulse 97   Ht 5\' 3"  (1.6 m)   Wt 88 lb 9.6 oz (40.2 kg)   BMI 15.69 kg/m     Wt Readings from Last 3 Encounters:  01/28/18 88 lb 9.6 oz (40.2 kg)  12/28/17 83 lb 3.2 oz (37.7 kg)  11/22/17 82 lb 4.8 oz (37.3 kg)     Physical Exam  Constitutional: She is oriented to person, place, and time. She appears well-developed and well-nourished. No distress.  HENT:  Head: Normocephalic and atraumatic.  Neck: Neck supple.  Cardiovascular: Normal rate, S1 normal, S2 normal and normal heart sounds. An irregularly irregular rhythm present.  No murmur heard. Pulmonary/Chest: Effort normal. She has no rales.  Abdominal: Soft. There is no hepatomegaly.  Musculoskeletal: She exhibits no edema.  Neurological: She is alert and oriented to person, place, and time.  Skin: Skin is warm and dry.    ASSESSMENT & PLAN:    #1.  Permanent atrial fibrillation (HCC) Rate is controlled.  She is tolerating anticoagulation with Warfarin.  Continue current therapy.  #2.  Chronic diastolic CHF (congestive heart failure) (HCC) Volume appears  stable.  Continue current therapy.   Dispo:  Return in about 6 months (around 07/30/2018) for Routine Follow Up, w/ [name], PA-C.   Medication Adjustments/Labs and Tests Ordered: Current medicines are reviewed at length with the patient today.  Concerns regarding medicines are outlined above.  Tests Ordered: Orders Placed This Encounter  Procedures  . EKG 12-Lead   Medication Changes: No orders of the defined types were placed in this encounter.   Signed, [name], PA-C  01/28/2018 1:17 PM    [facility] [address], [place], [place]  [address] Phone: [phone]; Fax: [phone]

## 2018-01-28 NOTE — Patient Instructions (Signed)
Medication Instructions: Your physician recommends that you continue on your current medications as directed. Please refer to the Current Medication list given to you today.   Labwork: None Ordered  Procedures/Testing: None Ordered  Follow-Up: Your physician recommends that you schedule a follow-up appointment in: 6 months with [name] PA-C   Any Additional Special Instructions Will Be Listed Below (If Applicable).     If you need a refill on your cardiac medications before your next appointment, please call your pharmacy.

## 2018-02-03 ENCOUNTER — Ambulatory Visit: Payer: PPO | Admitting: *Deleted

## 2018-02-03 DIAGNOSIS — I4821 Permanent atrial fibrillation: Secondary | ICD-10-CM

## 2018-02-03 DIAGNOSIS — Z5181 Encounter for therapeutic drug level monitoring: Secondary | ICD-10-CM

## 2018-02-03 DIAGNOSIS — I482 Chronic atrial fibrillation: Secondary | ICD-10-CM | POA: Diagnosis not present

## 2018-02-03 LAB — POCT INR: INR: 2.3

## 2018-02-03 NOTE — Patient Instructions (Signed)
Description   Continue taking  1/2 tablet everyday except 1 tablet on Tuesdays and Saturdays.   Recheck INR in 4 weeks. Call if placed on any new medications or scheduled for any procedures [phone]

## 2018-02-05 ENCOUNTER — Other Ambulatory Visit: Payer: Self-pay | Admitting: Physician Assistant

## 2018-02-05 DIAGNOSIS — I10 Essential (primary) hypertension: Secondary | ICD-10-CM

## 2018-02-05 DIAGNOSIS — I482 Chronic atrial fibrillation, unspecified: Secondary | ICD-10-CM

## 2018-02-15 ENCOUNTER — Encounter: Payer: Self-pay | Admitting: Physician Assistant

## 2018-02-22 DIAGNOSIS — H35033 Hypertensive retinopathy, bilateral: Secondary | ICD-10-CM | POA: Diagnosis not present

## 2018-02-22 DIAGNOSIS — H524 Presbyopia: Secondary | ICD-10-CM | POA: Diagnosis not present

## 2018-03-07 ENCOUNTER — Other Ambulatory Visit: Payer: Self-pay | Admitting: Physician Assistant

## 2018-03-07 ENCOUNTER — Other Ambulatory Visit: Payer: Self-pay | Admitting: Family Medicine

## 2018-03-08 ENCOUNTER — Ambulatory Visit: Payer: PPO | Admitting: *Deleted

## 2018-03-08 ENCOUNTER — Ambulatory Visit: Payer: PPO | Admitting: Physician Assistant

## 2018-03-08 DIAGNOSIS — Z5181 Encounter for therapeutic drug level monitoring: Secondary | ICD-10-CM | POA: Diagnosis not present

## 2018-03-08 DIAGNOSIS — I482 Chronic atrial fibrillation: Secondary | ICD-10-CM | POA: Diagnosis not present

## 2018-03-08 DIAGNOSIS — I4821 Permanent atrial fibrillation: Secondary | ICD-10-CM

## 2018-03-08 LAB — POCT INR: INR: 2.6 (ref 2.0–3.0)

## 2018-03-08 NOTE — Telephone Encounter (Signed)
Called and spoke to pt's daughter. Daughter stated that her mother does take this medication once daily for GOUT. NOT TID.  Sent to PCP for approval  Last OV 11/22/2017

## 2018-03-08 NOTE — Telephone Encounter (Signed)
Need to cal l pt are they taking this medication or were they advise to stop this medication?

## 2018-03-08 NOTE — Telephone Encounter (Signed)
Medication was discontinued

## 2018-03-08 NOTE — Patient Instructions (Signed)
Description   Continue taking  1/2 tablet everyday except 1 tablet on Tuesdays and Saturdays.   Recheck INR in 4 weeks. Call if placed on any new medications or scheduled for any procedures [phone]

## 2018-03-20 IMAGING — CT CT HEAD W/O CM
4 series · 19 of 47 positions shown, 21 images · non-contrast
Comparison: CT 01/29/2014, 03/28/2007 and MRI 03/28/2007

CLINICAL DATA: Fall, left-sided hematoma for forehead region.
Headache.

EXAM:
CT HEAD WITHOUT CONTRAST
TECHNIQUE: Contiguous axial images were obtained from the base of the skull
through the vertex without intravenous contrast.

[Series 201: head w/o, idose (1) · axial · non-contrast · 0.41mm/px · z∈[+77,+187]mm · 8 of 30 slices shown, 10 images]
[im 4/30  brain]
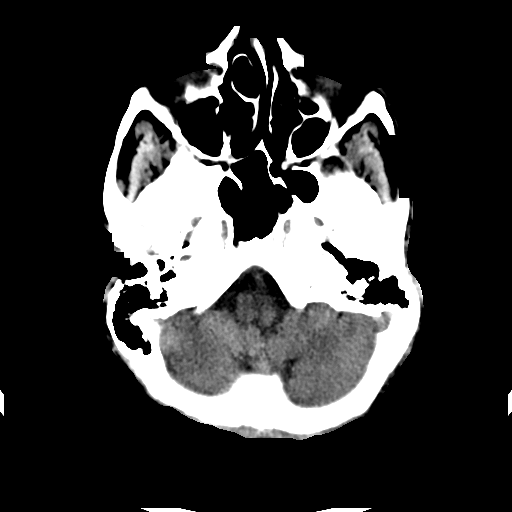
[im 4/30  bone]
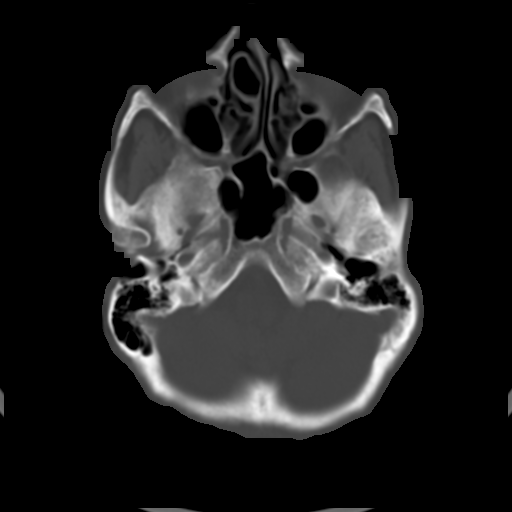
[im 7/30  brain]
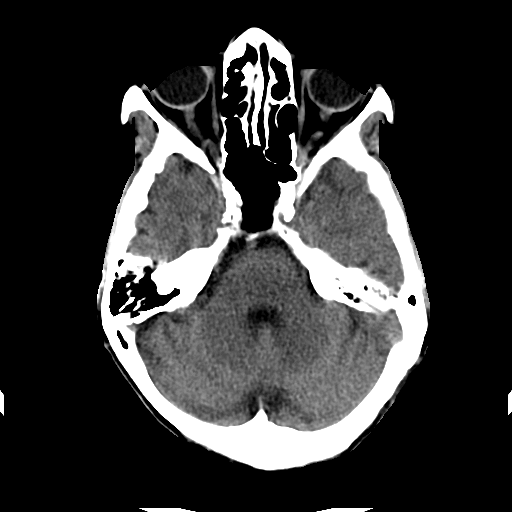
[im 10/30  brain]
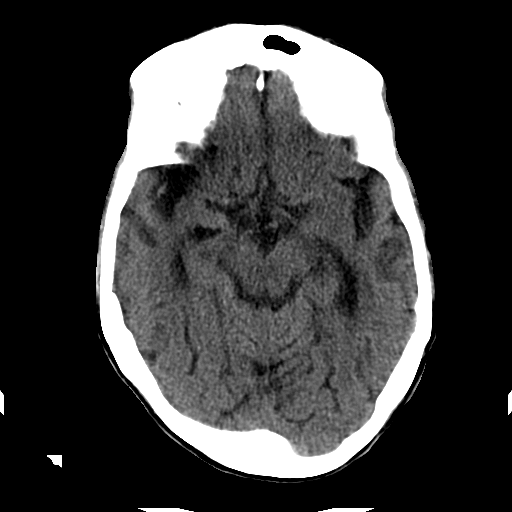
[im 13/30  brain]
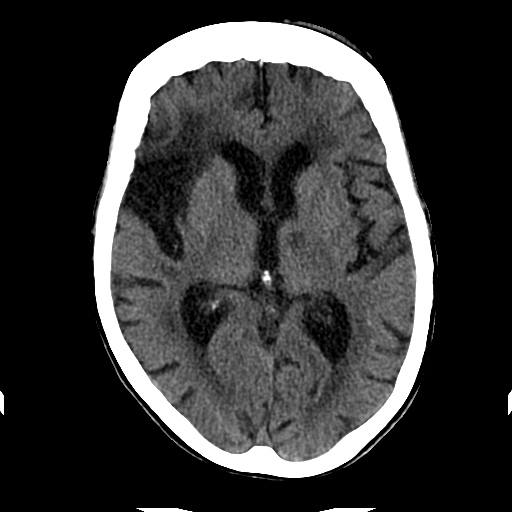
[im 17/30  brain]
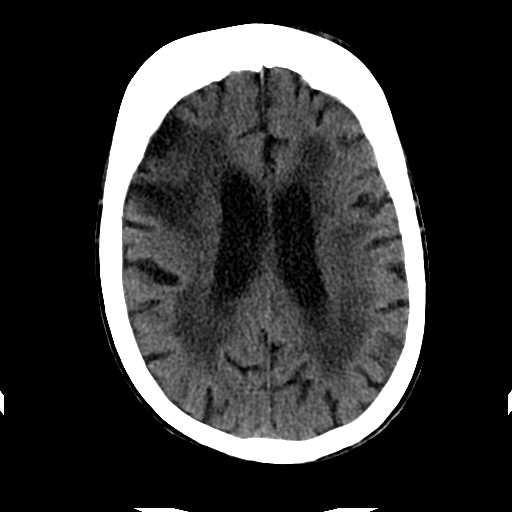
[im 17/30  bone]
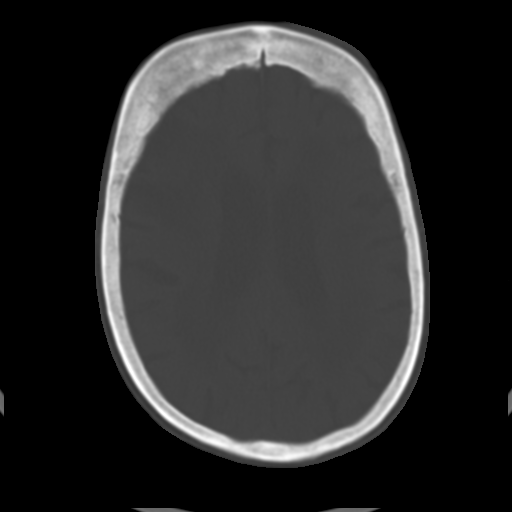
[im 20/30  brain]
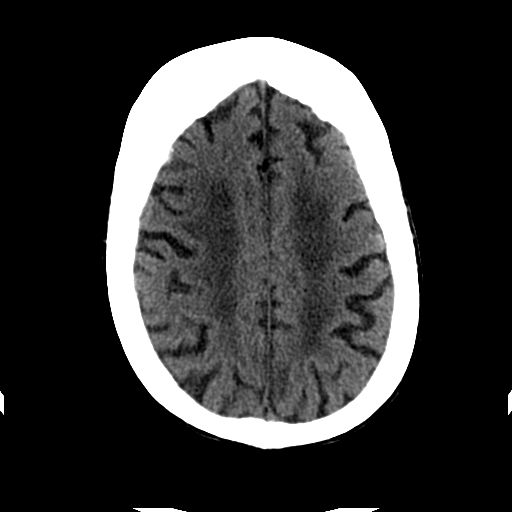
[im 23/30  brain]
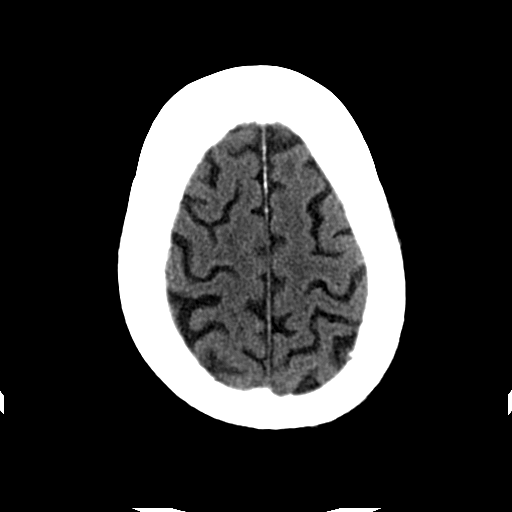
[im 26/30  brain]
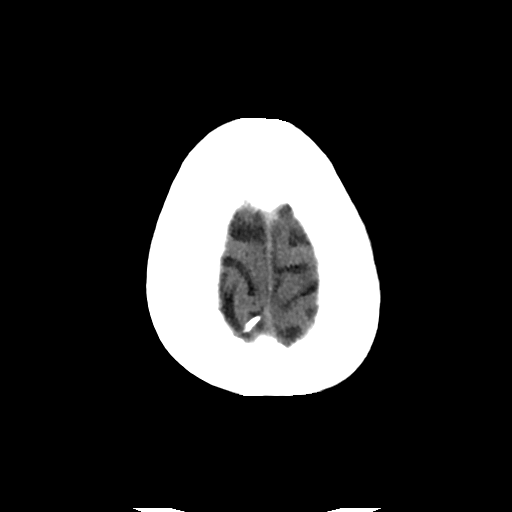

[Series 202: head w/o bone, idose (1) · axial · non-contrast · 0.41mm/px · z∈[+76,+146]mm · 5 of 60 slices shown]
[im 7/60  bone]
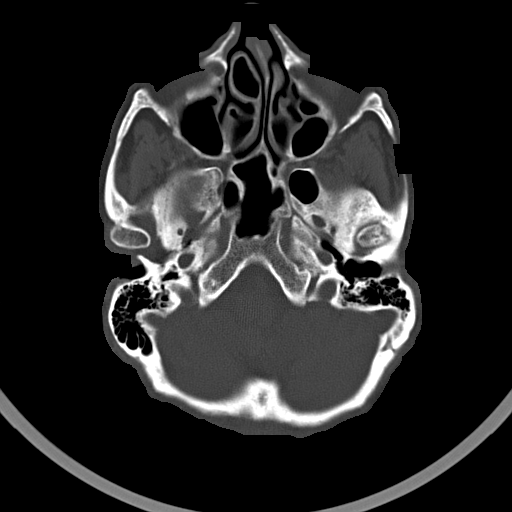
[im 13/60  bone]
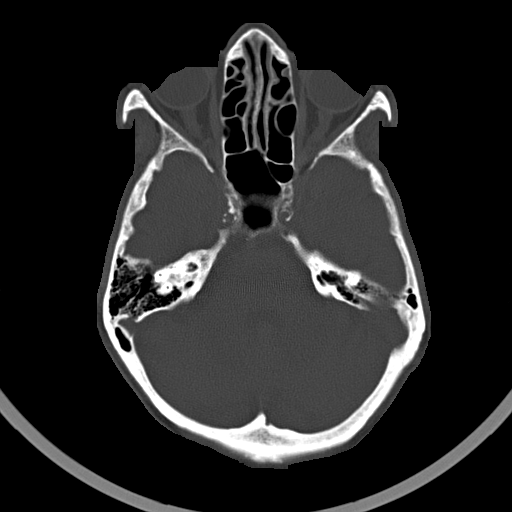
[im 19/60  bone]
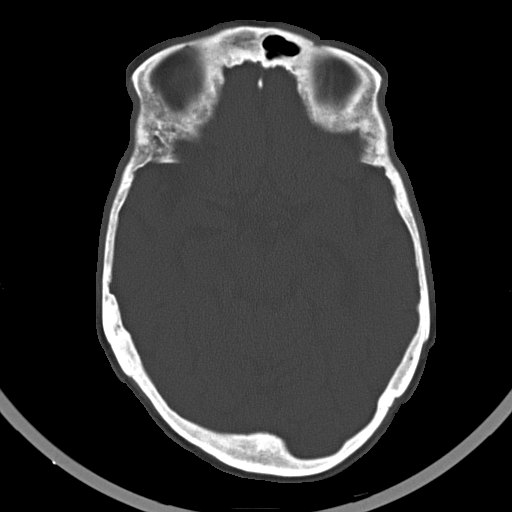
[im 25/60  bone]
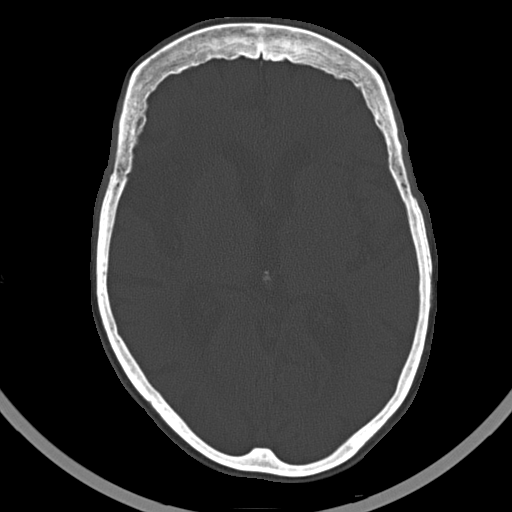
[im 35/60  bone]
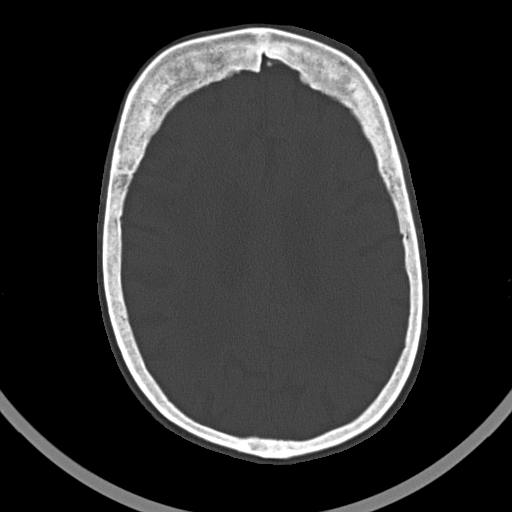

[Series 203: coronal st, idose (1) · coronal · 0.40mm/px · 3 of 69 slices shown]
[im 23/69  brain]
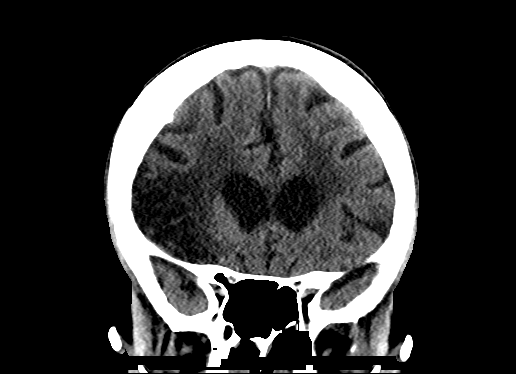
[im 31/69  brain]
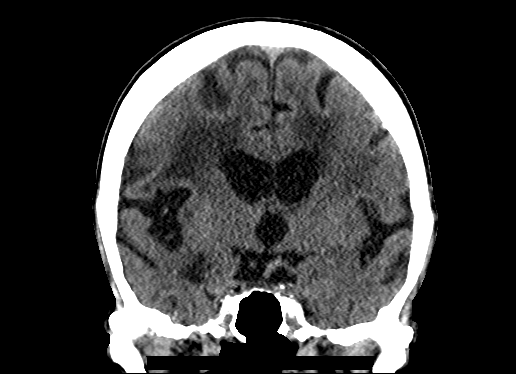
[im 38/69  brain]
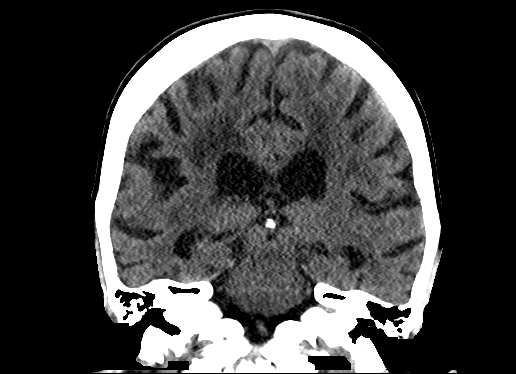

[Series 204: sagittal st, idose (1) · sagittal · 0.40mm/px · 3 of 68 slices shown]
[im 23/68  brain]
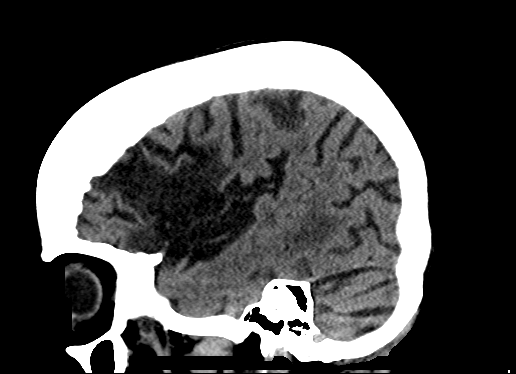
[im 34/68  brain]
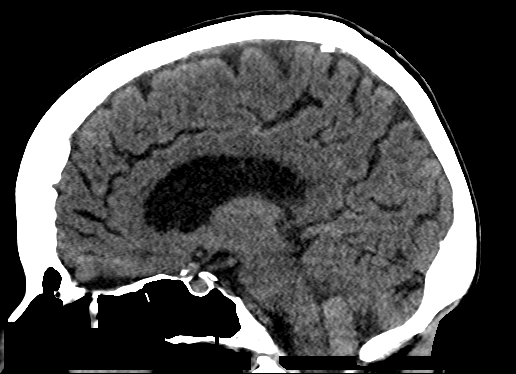
[im 45/68  brain]
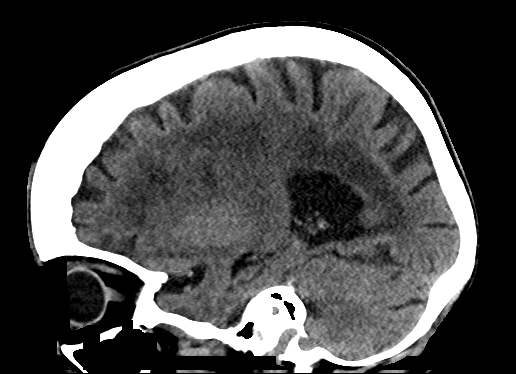

[19 of 47 positions shown; findings below may reference images not displayed]

FINDINGS: Ventricles, cisterns and remaining CSF spaces are unchanged as there
is minimal age related atrophic change. Evidence of chronic ischemic
microvascular disease. Old right MCA territory infarct. 9 mm focal
hypodensity in the region of the left thalamus/ internal capsule
compatible with previous lacunar infarct. No focal mass, mass
effect, shift of midline structures or acute hemorrhage. No evidence
to suggest acute infarction. Small left frontal scalp contusion. No
evidence of fracture.
IMPRESSION: No acute intracranial findings. Small left frontal scalp contusion.
No fracture.

Old right MCA territory infarct. Lacunar infarct over the left
thalamus/ internal capsule.

Mild atrophic change with chronic ischemic microvascular disease.

## 2018-04-05 ENCOUNTER — Ambulatory Visit: Payer: PPO | Admitting: *Deleted

## 2018-04-05 DIAGNOSIS — Z5181 Encounter for therapeutic drug level monitoring: Secondary | ICD-10-CM | POA: Diagnosis not present

## 2018-04-05 DIAGNOSIS — I4821 Permanent atrial fibrillation: Secondary | ICD-10-CM

## 2018-04-05 DIAGNOSIS — I482 Chronic atrial fibrillation: Secondary | ICD-10-CM | POA: Diagnosis not present

## 2018-04-05 LAB — POCT INR: INR: 2.9 (ref 2.0–3.0)

## 2018-04-05 NOTE — Patient Instructions (Signed)
Description   Continue taking  1/2 tablet everyday except 1 tablet on Tuesdays and Saturdays.   Recheck INR in 6 weeks. Call if placed on any new medications or scheduled for any procedures [phone]

## 2018-05-17 ENCOUNTER — Ambulatory Visit: Payer: PPO | Admitting: *Deleted

## 2018-05-17 DIAGNOSIS — I4821 Permanent atrial fibrillation: Secondary | ICD-10-CM

## 2018-05-17 DIAGNOSIS — I482 Chronic atrial fibrillation: Secondary | ICD-10-CM

## 2018-05-17 DIAGNOSIS — Z5181 Encounter for therapeutic drug level monitoring: Secondary | ICD-10-CM

## 2018-05-17 LAB — POCT INR: INR: 1.3 — AB (ref 2.0–3.0)

## 2018-05-17 NOTE — Patient Instructions (Signed)
Description   Today take an extra 1/2 tablet, tomorrow take 1 tablet. Then continue 1/2 tablet everyday except 1 tablet on Tuesdays and Saturdays.   Recheck INR in 9 days.  Call if placed on any new medications or scheduled for any procedures [phone]

## 2018-05-27 ENCOUNTER — Ambulatory Visit: Payer: PPO

## 2018-05-27 DIAGNOSIS — Z5181 Encounter for therapeutic drug level monitoring: Secondary | ICD-10-CM

## 2018-05-27 DIAGNOSIS — I482 Chronic atrial fibrillation: Secondary | ICD-10-CM | POA: Diagnosis not present

## 2018-05-27 DIAGNOSIS — I4821 Permanent atrial fibrillation: Secondary | ICD-10-CM

## 2018-05-27 LAB — POCT INR: INR: 2.1 (ref 2.0–3.0)

## 2018-05-27 NOTE — Patient Instructions (Signed)
Description   Continue on same dosage 1/2 tablet everyday except 1 tablet on Tuesdays and Saturdays.   Recheck INR in 2 weeks.  Call if placed on any new medications or scheduled for any procedures [phone]

## 2018-06-05 ENCOUNTER — Other Ambulatory Visit: Payer: Self-pay | Admitting: Physician Assistant

## 2018-06-10 ENCOUNTER — Ambulatory Visit: Payer: PPO | Admitting: Pharmacist

## 2018-06-10 ENCOUNTER — Telehealth: Payer: Self-pay | Admitting: Family Medicine

## 2018-06-10 DIAGNOSIS — I482 Chronic atrial fibrillation: Secondary | ICD-10-CM | POA: Diagnosis not present

## 2018-06-10 DIAGNOSIS — Z5181 Encounter for therapeutic drug level monitoring: Secondary | ICD-10-CM | POA: Diagnosis not present

## 2018-06-10 DIAGNOSIS — I4821 Permanent atrial fibrillation: Secondary | ICD-10-CM

## 2018-06-10 LAB — POCT INR: INR: 2.5 (ref 2.0–3.0)

## 2018-06-10 NOTE — Telephone Encounter (Signed)
Copied from [place] [id number]. Topic: Quick Communication - Rx Refill/Question >> Jun 10, 2018  2:38 PM Keene Breath wrote: Medication: oxyCODONE-acetaminophen (PERCOCET/ROXICET) 5-325 MG tablet  Patient's daughter called to request a refill for the above medication.  CB# [phone]  Preferred Pharmacy (with phone number or street name): [id number] [address], [place] [phone] (Phone) [phone] (Fax)

## 2018-06-10 NOTE — Telephone Encounter (Signed)
Percocet refill Last Refill:03/18/17 # 240 with 0 refill Last OV: 11/22/17 PCP: Dr. Sarajane Jews Pharmacy:[facility].

## 2018-06-10 NOTE — Patient Instructions (Signed)
Description   Continue on same dosage 1/2 tablet everyday except 1 tablet on Tuesdays and Saturdays.   Recheck INR in 3 weeks.  Call if placed on any new medications or scheduled for any procedures [phone]

## 2018-06-10 NOTE — Telephone Encounter (Signed)
Sent to [facility] to advise

## 2018-06-13 NOTE — Telephone Encounter (Signed)
Called and spoke with pt. Pt advised and voiced understanding. Scheduled for an appt.

## 2018-06-13 NOTE — Telephone Encounter (Signed)
She will need a PMV for this

## 2018-06-24 ENCOUNTER — Ambulatory Visit (INDEPENDENT_AMBULATORY_CARE_PROVIDER_SITE_OTHER): Payer: PPO | Admitting: Family Medicine

## 2018-06-24 ENCOUNTER — Encounter: Payer: Self-pay | Admitting: Family Medicine

## 2018-06-24 VITALS — BP 108/80 | HR 73 | Temp 97.7°F | Wt 85.1 lb

## 2018-06-24 DIAGNOSIS — N390 Urinary tract infection, site not specified: Secondary | ICD-10-CM | POA: Diagnosis not present

## 2018-06-24 DIAGNOSIS — Z23 Encounter for immunization: Secondary | ICD-10-CM

## 2018-06-24 DIAGNOSIS — M159 Polyosteoarthritis, unspecified: Secondary | ICD-10-CM

## 2018-06-24 DIAGNOSIS — I482 Chronic atrial fibrillation: Secondary | ICD-10-CM

## 2018-06-24 DIAGNOSIS — I4821 Permanent atrial fibrillation: Secondary | ICD-10-CM

## 2018-06-24 DIAGNOSIS — M150 Primary generalized (osteo)arthritis: Secondary | ICD-10-CM

## 2018-06-24 DIAGNOSIS — F1190 Opioid use, unspecified, uncomplicated: Secondary | ICD-10-CM

## 2018-06-24 LAB — POC URINALSYSI DIPSTICK (AUTOMATED)
BILIRUBIN UA: NEGATIVE
Glucose, UA: NEGATIVE
KETONES UA: NEGATIVE
Nitrite, UA: POSITIVE
Protein, UA: POSITIVE — AB
SPEC GRAV UA: 1.02 (ref 1.010–1.025)
Urobilinogen, UA: 0.2 E.U./dL
pH, UA: 6 (ref 5.0–8.0)

## 2018-06-24 LAB — CBC WITH DIFFERENTIAL/PLATELET
Basophils Absolute: 0 10*3/uL (ref 0.0–0.1)
Basophils Relative: 0.8 % (ref 0.0–3.0)
EOS PCT: 2.7 % (ref 0.0–5.0)
Eosinophils Absolute: 0.1 10*3/uL (ref 0.0–0.7)
HCT: 37 % (ref 36.0–46.0)
HEMOGLOBIN: 12.4 g/dL (ref 12.0–15.0)
LYMPHS ABS: 2.1 10*3/uL (ref 0.7–4.0)
Lymphocytes Relative: 40.5 % (ref 12.0–46.0)
MCHC: 33.6 g/dL (ref 30.0–36.0)
MCV: 99.6 fl (ref 78.0–100.0)
MONO ABS: 0.5 10*3/uL (ref 0.1–1.0)
MONOS PCT: 9.7 % (ref 3.0–12.0)
Neutro Abs: 2.4 10*3/uL (ref 1.4–7.7)
Neutrophils Relative %: 46.3 % (ref 43.0–77.0)
Platelets: 239 10*3/uL (ref 150.0–400.0)
RBC: 3.72 Mil/uL — AB (ref 3.87–5.11)
RDW: 14 % (ref 11.5–15.5)
WBC: 5.3 10*3/uL (ref 4.0–10.5)

## 2018-06-24 LAB — BASIC METABOLIC PANEL
BUN: 10 mg/dL (ref 6–23)
CALCIUM: 9.5 mg/dL (ref 8.4–10.5)
CO2: 31 meq/L (ref 19–32)
Chloride: 101 mEq/L (ref 96–112)
Creatinine, Ser: 0.73 mg/dL (ref 0.40–1.20)
GFR: 79.58 mL/min (ref >60.00)
GLUCOSE: 78 mg/dL (ref 70–99)
POTASSIUM: 4 meq/L (ref 3.5–5.1)
SODIUM: 136 meq/L (ref 135–145)

## 2018-06-24 LAB — HEPATIC FUNCTION PANEL
ALBUMIN: 3.9 g/dL (ref 3.5–5.2)
ALK PHOS: 73 U/L (ref 39–117)
ALT: 5 U/L (ref 0–35)
AST: 12 U/L (ref 0–37)
BILIRUBIN DIRECT: 0.2 mg/dL (ref 0.0–0.3)
TOTAL PROTEIN: 7.4 g/dL (ref 6.0–8.3)
Total Bilirubin: 0.8 mg/dL (ref 0.2–1.2)

## 2018-06-24 LAB — TSH: TSH: 4.82 u[IU]/mL — ABNORMAL HIGH (ref 0.35–4.50)

## 2018-06-24 LAB — T4, FREE: FREE T4: 0.79 ng/dL (ref 0.60–1.60)

## 2018-06-24 LAB — T3, FREE: T3, Free: 3.1 pg/mL (ref 2.3–4.2)

## 2018-06-24 MED ORDER — OXYCODONE-ACETAMINOPHEN 5-325 MG PO TABS: 1.0000 | ORAL_TABLET | ORAL | 0 refills | Status: AC | PRN

## 2018-06-24 NOTE — Progress Notes (Signed)
   Subjective:    Patient ID: [name], female    DOB: 16-Mar-1926, 84 y.o.   MRN: [id number]  HPI Here for pain management. Her daughter is with her. She is doing well. She typically takes on e Percocet at bedtime only.  Indication for chronic opioid: osteoarthritis  Medication and dose: Percocet 5-325 # pills per month: 240 Last UDS date: 06-24-18 Opioid Treatment Agreement signed (Y/N): 06-24-18 Opioid Treatment Agreement last reviewed with patient:  06-24-18 [facility] reviewed this encounter (include red flags):  06-24-18    Review of Systems  Constitutional: Negative.   Cardiovascular: Negative.   Musculoskeletal: Positive for arthralgias.  Neurological: Negative.        Objective:   Physical Exam  Constitutional: She is oriented to person, place, and time. She appears well-developed and well-nourished.  Cardiovascular: Normal rate, regular rhythm, normal heart sounds and intact distal pulses.  Pulmonary/Chest: Effort normal and breath sounds normal.  Neurological: She is alert and oriented to person, place, and time.          Assessment & Plan:  Pain management, meds were refilled.  Alysia Penna, MD

## 2018-06-26 LAB — URINE CULTURE
MICRO NUMBER:: 91132423
SPECIMEN QUALITY:: ADEQUATE

## 2018-06-28 ENCOUNTER — Other Ambulatory Visit: Payer: Self-pay | Admitting: Family Medicine

## 2018-06-28 LAB — PAIN MGMT, PROFILE 8 W/CONF, U
6 ACETYLMORPHINE: NEGATIVE ng/mL (ref <10)
AMPHETAMINES: NEGATIVE ng/mL (ref <500)
Alcohol Metabolites: NEGATIVE ng/mL (ref <500)
Benzodiazepines: NEGATIVE ng/mL (ref <100)
Buprenorphine, Urine: NEGATIVE ng/mL (ref <5)
COCAINE METABOLITE: NEGATIVE ng/mL (ref <150)
CREATININE: 84.1 mg/dL
MARIJUANA METABOLITE: NEGATIVE ng/mL (ref <20)
MDMA: NEGATIVE ng/mL (ref <500)
Noroxycodone: 512 ng/mL — ABNORMAL HIGH (ref <50)
OPIATES: NEGATIVE ng/mL (ref <100)
OXIDANT: NEGATIVE ug/mL (ref <200)
OXYMORPHONE: 198 ng/mL — AB (ref <50)
Oxycodone: 114 ng/mL — ABNORMAL HIGH (ref <50)
Oxycodone: POSITIVE ng/mL — AB (ref <100)
PH: 7.97 (ref 4.5–9.0)

## 2018-06-28 MED ORDER — CEPHALEXIN 500 MG PO CAPS: 500.0000 mg | ORAL_CAPSULE | Freq: Three times a day (TID) | ORAL | 0 refills | Status: AC

## 2018-07-01 ENCOUNTER — Ambulatory Visit: Payer: PPO | Admitting: Pharmacist

## 2018-07-01 DIAGNOSIS — Z5181 Encounter for therapeutic drug level monitoring: Secondary | ICD-10-CM | POA: Diagnosis not present

## 2018-07-01 DIAGNOSIS — I482 Chronic atrial fibrillation: Secondary | ICD-10-CM | POA: Diagnosis not present

## 2018-07-01 DIAGNOSIS — I4821 Permanent atrial fibrillation: Secondary | ICD-10-CM

## 2018-07-01 LAB — POCT INR: INR: 2.8 (ref 2.0–3.0)

## 2018-07-01 NOTE — Patient Instructions (Signed)
Description   Continue on same dosage 1/2 tablet every day except 1 tablet on Tuesdays and Saturdays.   Recheck INR in 6 weeks.  Call if placed on any new medications or scheduled for any procedures [phone]

## 2018-07-02 ENCOUNTER — Other Ambulatory Visit: Payer: Self-pay | Admitting: Cardiovascular Disease

## 2018-07-05 ENCOUNTER — Telehealth: Payer: Self-pay | Admitting: Family Medicine

## 2018-07-05 NOTE — Telephone Encounter (Signed)
Copied from [place] [id number]. Topic: General - Other >> Jul 05, 2018  5:46 PM Cecelia Byars, NT wrote: Reason for CRM: Patients daughter Marliss Czar  called and sadi the patient was not taking the cephALEXin (KEFLEX) 500 MG capsule properly and they are requesting a 3 supply please call her at  [phone]

## 2018-07-06 NOTE — Telephone Encounter (Signed)
Have her just finish up what she has and see how she does

## 2018-07-06 NOTE — Telephone Encounter (Signed)
Dr. Fry please advise. Thanks  

## 2018-07-06 NOTE — Telephone Encounter (Signed)
Spoke to [employer].  Pt started medication last Tuesday.  Forgot to take it over the weekend due to confusion from the UTI.  Has restarted taking it 3 times daily. A neighbor is helping out.  Leigh would like to know if additional medication needs to be sent in.  Please advise.

## 2018-07-07 NOTE — Telephone Encounter (Signed)
Called and spoke with pts daughter and she is aware of Dr. Barbie Banner recs.

## 2018-07-12 ENCOUNTER — Ambulatory Visit: Payer: PPO | Admitting: Physician Assistant

## 2018-07-12 ENCOUNTER — Encounter: Payer: Self-pay | Admitting: Physician Assistant

## 2018-07-12 VITALS — BP 122/54 | Ht 63.0 in | Wt 85.8 lb

## 2018-07-12 DIAGNOSIS — I5032 Chronic diastolic (congestive) heart failure: Secondary | ICD-10-CM

## 2018-07-12 DIAGNOSIS — I10 Essential (primary) hypertension: Secondary | ICD-10-CM

## 2018-07-12 DIAGNOSIS — R001 Bradycardia, unspecified: Secondary | ICD-10-CM

## 2018-07-12 DIAGNOSIS — I4821 Permanent atrial fibrillation: Secondary | ICD-10-CM | POA: Diagnosis not present

## 2018-07-12 DIAGNOSIS — E785 Hyperlipidemia, unspecified: Secondary | ICD-10-CM | POA: Diagnosis not present

## 2018-07-12 NOTE — Progress Notes (Signed)
Cardiology Office Note:    Date:  07/12/2018   ID:  [name], DOB 29-Apr-1926, MRN [id number]  PCP:  Christy Morale, MD  Cardiologist:  Christy Carnes, MD / Christy Dopp, PA-C  Electrophysiologist:  None   Referring MD: Christy Morale, MD   Chief Complaint  Patient presents with  . Follow-up    AFib, CHF    History of Present Illness:    [name] is a 84 y.o. female with chronic AF, sinus node dysfunction, chronic diastolic HF. She was admitted to the hospital in 4/15 with atrial fibrillation with RVR and acute on chronic diastolic CHF. She was diuresed and digoxin was restarted for rate control. Holter done after restarting digoxin showed average HR 88 (atrial fibrillation) with occasional pauses, longest was 2.9 seconds. Repeat Holter in 03/2016 demonstrated anelevated HR up to 140, but her avg HR was ok at 89.  She was last seen in 01/2018.    [name] returns for follow-up.  She is here with her daughter.  Since last seen, she has been treated for a urinary tract infection.  She has not had syncope or near syncope, chest pain, shortness of breath, paroxysmal nocturnal dyspnea or leg swelling.  Prior CV studies:   The following studies were reviewed today:  Holter 6/17 Sustained atrial fibrillation with some PVCs, HR up to 140 but average rate 89.   Head CT 02/14/16 IMPRESSION: No acute intracranial findings. Small left frontal scalp contusion. No fracture. Old right MCA territory infarct. Lacunar infarct over the left thalamus/ internal capsule. Mild atrophic change with chronic ischemic microvascular disease.  Carotid US 4/16 Bilateral 1-39% ICA  Holter 4/15 Persistent AFib, Freq PVCs, HR 51-127, avg HR 88, longest pause 2.9 sec  Echo 1/15 EF 55%, no RWMA, probably mild AS, mild AI, mild MR, mild LAE  Past Medical History:  Diagnosis Date  . Aortic stenosis    a. mild on echo 10/2013  . Blood clotting disorder ([place])   . Bradycardia    a. Holter  (1/15):  avg HR 78, 1.4% PVCs, several pauses </= 3 sec, b.  Holter (01/2014):  AFib, freq PVCs, HR 51-127, Avg HR 88, longest pause 2.9 sec - no changes made  . Carotid stenosis    a. Carotid US (4/14):  RICA 40-59%;  b. Carotid US (01/2014) bilateral 1-39%  . Chronic atrial fibrillation    a. permanent;  b. chronic coumadin;  c. 10/2013 digoxin and atenolol d/c'd 2/2 pauses of <3 sec;  d. 01/2014 digoxin resumed @ 0.125 mg in setting of difficult to control rate and diast chf.  . Chronic diastolic heart failure ([place])    a. Echo (6/08):  EF 55-60%, mild MR;  b. Echo (10/2013):  EF 55%, mild AS  . Contact dermatitis and other eczema, due to unspecified cause    a. h/o myalgias with atorvastatin.  . Diverticulosis of colon (without mention of hemorrhage)   . DJD (degenerative joint disease)   . Frequent UTI    Dr. Terance Key sees pt  . Gout, unspecified   . H/O: CVA (cardiovascular accident) 2008  . History of CVA (cerebrovascular accident)    2008  . History of melanoma    on right ear  . Hyperlipidemia   . Hypertension    a. hx of edema with Amlodipine.  . Hypothyroidism   . Lumbago    chronic low back pain  . Osteoporosis    las DEXA on 01/30/10  .  Skin cancer    forehead  . Urticaria, unspecified   1. Chronic atrial fibrillation 2. Gout 3. Diastolic CHF: Echo (7/61) with EF 55-60%, mild MR.Echo (1/15) with EF 55%, probably at least mild AS.  4. HTN: Edema with amlodipine . 5. Hyperlipidemia: myalgias with atorvastatin 6. H/o UTIs  7. Chronic low back pain.  8. Hypothyroidism 9. CVA in 2008.  10.OA 11. Diverticulosis 12. Osteoporosis 13. H/o melanoma 14. Carotid stenosis: Dopplers (4/14) with 40-59% RICA stenosis (has been stable).  15. Bradycardia: Holter (1/15) with average HR 78, 1.4% PVCs, several pauses </= 3 seconds.Holter (4/15) with atrial fibrillation, average HR 88, frequent PVCs, longest pause 2.9 sec 16. Aortic stenosis: At least mild on 1/15  echo.   Surgical Hx: The patient  has a past surgical history that includes Skin cancer excision (2001); Abdominal hysterectomy; Tonsillectomy; Colonoscopy (07-02-11); Band hemorrhoidectomy (last on 10-28-11); Flexible sigmoidoscopy (10/28/2011); and gastric varices banding (10/28/2011).   Current Medications: Current Meds  Medication Sig  . allopurinol (ZYLOPRIM) 100 MG tablet Take 100 mg by mouth daily.  [name] diltiazem (CARDIZEM CD) 240 MG 24 hr capsule TAKE ONE CAPSULE BY MOUTH EVERY DAY  . furosemide (LASIX) 20 MG tablet Take 1 tablet (20 mg total) by mouth every other day.  . levothyroxine (SYNTHROID, LEVOTHROID) 50 MCG tablet Take 1 tablet (50 mcg total) by mouth daily.  . metoprolol succinate (TOPROL-XL) 50 MG 24 hr tablet Take 1 tablet (50 mg total) by mouth 2 (two) times daily.  [name] oxyCODONE-acetaminophen (PERCOCET/ROXICET) 5-325 MG tablet Take 1 tablet by mouth every 4 (four) hours as needed for moderate pain or severe pain.  . potassium chloride (KLOR-CON M10) 10 MEQ tablet Take 1 tablet (10 mEq total) by mouth 2 (two) times daily.  . pravastatin (PRAVACHOL) 10 MG tablet TAKE ONE (1) TABLET (10 MG) BY MOUTH THREE (3) TIMES A WEEK (MONDAYS, WEDNESDAYS, AND FRIDAYS).  [name] warfarin (COUMADIN) 5 MG tablet TAKE 1 TABLET (5 MG TOTAL) BY MOUTH AS DIRECTED.  . [DISCONTINUED] digoxin (LANOXIN) 0.125 MG tablet TAKE 1/2 A TABLET BY MOUTH EVERY DAY     Allergies:   Amlodipine; Amoxicillin; Hydrocodone-acetaminophen; Nitrofurantoin; and Sulfonamide derivatives   Social History   Tobacco Use  . Smoking status: Former Smoker    Last attempt to quit: 10/05/1994    Years since quitting: 23.7  . Smokeless tobacco: Never Used  Substance Use Topics  . Alcohol use: No    Alcohol/week: 0.0 standard drinks  . Drug use: No     Family Hx: The patient's family history includes Anemia in her sister and unknown relative; COPD in her unknown relative; Colon polyps in her mother; Coronary artery disease in  her unknown relative; Hypertension in her mother; Immunodeficiency in her unknown relative; Stroke in her father. There is no history of Colon cancer or Heart attack.  ROS:   Please see the history of present illness.    ROS All other systems reviewed and are negative.   EKGs/Labs/Other Test Reviewed:    EKG:  EKG is  ordered today.  The ekg ordered today demonstrates atrial fibrillation, heart rate 57, QTC 408  Recent Labs: 06/24/2018: ALT 5; BUN 10; Creatinine, Ser 0.73; Hemoglobin 12.4; Platelets 239.0; Potassium 4.0; Sodium 136; TSH 4.82   Recent Lipid Panel Lab Results  Component Value Date/Time   CHOL 146 12/17/2016 12:33 PM   TRIG 87.0 12/17/2016 12:33 PM   HDL 38.00 (L) 12/17/2016 12:33 PM   CHOLHDL 4 12/17/2016 12:33 PM  [place] 91 12/17/2016 12:33 PM   LDLDIRECT 141.5 08/06/2010 09:44 AM    Physical Exam:    VS:  BP (!) 122/54   Ht 5\' 3"  (1.6 m)   Wt 85 lb 12.8 oz (38.9 kg)   BMI 15.20 kg/m     Wt Readings from Last 3 Encounters:  07/12/18 85 lb 12.8 oz (38.9 kg)  06/24/18 85 lb 2 oz (38.6 kg)  01/28/18 88 lb 9.6 oz (40.2 kg)     Physical Exam  Constitutional: She is oriented to person, place, and time. She appears well-developed and well-nourished. No distress.  HENT:  Head: Normocephalic and atraumatic.  Eyes: No scleral icterus.  Neck: No thyromegaly present.  Cardiovascular: Normal rate. An irregularly irregular rhythm present.  No murmur heard. Pulmonary/Chest: Effort normal. She has no rales.  Abdominal: Soft.  Musculoskeletal: She exhibits no edema.  Lymphadenopathy:    She has no cervical adenopathy.  Neurological: She is alert and oriented to person, place, and time.  Skin: Skin is warm and dry.  Psychiatric: She has a normal mood and affect.    ASSESSMENT & PLAN:    Chronic diastolic CHF (congestive heart failure) (HCC) Volume appears stable.  Continue current therapy.  Permanent atrial fibrillation/Bradycardia  Heart rate is now in  the 50s.  Overall, she does not seem to be symptomatic.  In the past, we have had difficulty controlling her heart rate.  She is currently on metoprolol succinate, diltiazem CD and digoxin.  I have recommended that we stop her digoxin and repeat an ECG in 1 week.  If she continues to have issues with a slow heart rate, I may need to decrease her beta-blocker +/- calcium channel blocker.  Plan six-month follow-up.  Return sooner if she continues to have issues with bradycardia.  -DC digoxin  -ECG in 1 week  Hypertension The patient's blood pressure is controlled on her current regimen.  Continue current therapy.   Hyperlipidemia Continue statin.  Dispo:  Return in about 6 months (around 01/11/2019) for Routine Follow Up, w/ Christy Dopp, PA-C.   Medication Adjustments/Labs and Tests Ordered: Current medicines are reviewed at length with the patient today.  Concerns regarding medicines are outlined above.  Tests Ordered: Orders Placed This Encounter  Procedures  . EKG 12-Lead  . PR OFFICE OUTPATIENT VISIT 5 MINUTES   Medication Changes: No orders of the defined types were placed in this encounter.   Signed, Christy Dopp, PA-C  07/12/2018 4:11 PM    [facility] [place], [place], [place]  [address] Phone: [phone]; Fax: [phone]

## 2018-07-12 NOTE — Patient Instructions (Signed)
Medication Instructions:  STOP DIGOXIN  If you need a refill on your cardiac medications before your next appointment, please call your pharmacy.   Lab work: NONE ORDERED TODAY If you have labs (blood work) drawn today and your tests are completely normal, you will receive your results only by: [name] MyChart Message (if you have MyChart) OR . A paper copy in the mail If you have any lab test that is abnormal or we need to change your treatment, we will call you to review the results.  Testing/Procedures: NONE ORDERED  Follow-Up: At [facility], you and your health needs are our priority.  As part of our continuing mission to provide you with exceptional heart care, we have created designated Provider Care Teams.  These Care Teams include your primary Cardiologist (physician) and Advanced Practice Providers (APPs -  Physician Assistants and Nurse Practitioners) who all work together to provide you with the care you need, when you need it. You will need a follow up appointment in:  6 months.  Please call our office 2 months in advance to schedule this appointment.  You may see Dorris Carnes, MD or one of the following Advanced Practice Providers on your designated Care Team: [name], PA-C [place], [place] . Daune Perch, NP  2. YOU WILL NEED A NURSE VISIT NEXT WEEK FOR AN EKG   Any Other Special Instructions Will Be Listed Below (If Applicable).

## 2018-07-13 ENCOUNTER — Telehealth: Payer: Self-pay | Admitting: *Deleted

## 2018-07-13 NOTE — Telephone Encounter (Signed)
   [facility] Pre-operative Risk Assessment    Request for surgical clearance:  1. What type of surgery is being performed? 12 TEETH EXTRACTED   2. When is this surgery scheduled?  TBD    3. Are there any medications that need to be held prior to surgery and how long? WARFARIN   4. Practice name and name of physician performing surgery? [facility]; DR. ANN ARMSTRONG   5. What is your office phone and fax number? PH# [phone]; FAX # [phone]   2. Anesthesia type (None, local, MAC, general) ? LOCAL; LIDOCAINE,        SEPTI CAINE    Julaine Hua 07/13/2018, 2:33 PM  _________________________________________________________________   (provider comments below)

## 2018-07-15 NOTE — Telephone Encounter (Signed)
Pharm please address coumadin for 12 teeth to be extracted.

## 2018-07-15 NOTE — Telephone Encounter (Signed)
Will need results of EKG next week for clearance

## 2018-07-18 NOTE — Telephone Encounter (Signed)
Patient with diagnosis of atrial fibrillation on warfarin for anticoagulation.    Procedure: 12 teeth extracted Date of procedure: TBD  CHADS2-VASc score of  7 (CHF, HTN, AGE, , stroke/tia x 2, , AGE, female)  CrCl 31.5 Platelet count 239  Per office protocol, patient can hold warfarin for 5 days prior to procedure.    Patient will need bridging with Lovenox (enoxaparin) around procedure.

## 2018-07-18 NOTE — Telephone Encounter (Signed)
Spoke with patient and reminded her to call once appt is scheduled for dental extractions.

## 2018-07-19 ENCOUNTER — Ambulatory Visit (INDEPENDENT_AMBULATORY_CARE_PROVIDER_SITE_OTHER): Payer: PPO

## 2018-07-19 VITALS — BP 120/60 | HR 61 | Wt 85.0 lb

## 2018-07-19 DIAGNOSIS — I5032 Chronic diastolic (congestive) heart failure: Secondary | ICD-10-CM

## 2018-07-19 DIAGNOSIS — R001 Bradycardia, unspecified: Secondary | ICD-10-CM | POA: Diagnosis not present

## 2018-07-19 NOTE — Progress Notes (Signed)
1.) Reason for visit: EKG  2.) Name of MD requesting visit: [name] PA  3.) H&P: D/C Digoxin at last visit  4.) ROS related to problem: checking HR  5.) Assessment and plan per MD: Per [name] PA pt to stay off of Digoxin. Pt reports that she is feeling well. Will call if she has any problems.

## 2018-07-19 NOTE — Progress Notes (Signed)
HR improved.  Permanent atrial fibrillation. Continue current medications and follow up as planned.  [name], PA-C    07/19/2018 3:33 PM

## 2018-07-19 NOTE — Patient Instructions (Signed)
Medication Instructions:  Stay off of Digoxin... No other changes.    If you need a refill on your cardiac medications before your next appointment, please call your pharmacy.   Lab work: None ordered  If you have labs (blood work) drawn today and your tests are completely normal, you will receive your results only by: [name] MyChart Message (if you have MyChart) OR . A paper copy in the mail If you have any lab test that is abnormal or we need to change your treatment, we will call you to review the results.   Testing/Procedures: None ordered   Follow-Up: At [facility], you and your health needs are our priority.  As part of our continuing mission to provide you with exceptional heart care, we have created designated Provider Care Teams.  These Care Teams include your primary Cardiologist (physician) and Advanced Practice Providers (APPs -  Physician Assistants and Nurse Practitioners) who all work together to provide you with the care you need, when you need it.    Any Other Special Instructions Will Be Listed Below (If Applicable).

## 2018-07-20 NOTE — Telephone Encounter (Signed)
   Call back staff: [name] Clearance letter has been faxed to the requesting surgeon. . Please contact the surgeon's office to ensure it has been received. . This phone note will be removed from the preop pool. . Please sign encounter when completed.  [name], PA-C    07/20/2018 5:08 PM

## 2018-07-21 NOTE — Telephone Encounter (Signed)
Resent clearance over and closed encounter.

## 2018-07-27 ENCOUNTER — Telehealth: Payer: Self-pay | Admitting: Physician Assistant

## 2018-07-27 NOTE — Telephone Encounter (Signed)
Spoke with pt and her daughter - scheduled INR check for 10/24 and will review Lovenox instructions.

## 2018-07-27 NOTE — Telephone Encounter (Signed)
New Message:     Pt said [name] wanted pt to take Lovenox. She said will need a prescription called in for her to [facility] [phone].

## 2018-07-27 NOTE — Telephone Encounter (Signed)
I s/w pt's daughter Marliss Czar (DPR) in regards to Lovenox. I asked if surgery has been scheduled yet. Per Marliss Czar, pt's daughter surgery is scheduled for 08/02/18. I stated I will forward message over to the Pharm-D for Lovenox bridging and that they will call them to explain about the Lovenox. Leigh thanked me for the call. See Pre OP Clearance note.

## 2018-07-28 ENCOUNTER — Ambulatory Visit (INDEPENDENT_AMBULATORY_CARE_PROVIDER_SITE_OTHER): Payer: PPO | Admitting: *Deleted

## 2018-07-28 DIAGNOSIS — I4821 Permanent atrial fibrillation: Secondary | ICD-10-CM | POA: Diagnosis not present

## 2018-07-28 DIAGNOSIS — Z5181 Encounter for therapeutic drug level monitoring: Secondary | ICD-10-CM

## 2018-07-28 LAB — POCT INR: INR: 3.1 — AB (ref 2.0–3.0)

## 2018-07-28 MED ORDER — ENOXAPARIN SODIUM 60 MG/0.6ML ~~LOC~~ SOLN: 60.0000 mg | SUBCUTANEOUS | 0 refills | Status: DC

## 2018-07-28 NOTE — Patient Instructions (Addendum)
Description   Continue holding your Coumadin for the procedure on 08/02/18. Refer to patient instructions.  Normal Dose: Coumadin 1/2 tablet every day except 1 tablet on Tuesdays and Saturdays.   Recheck INR in 1 week after procedure.  Call if placed on any new medications or scheduled for any procedures [phone]     07/27/18: Last dose of Coumadin.  07/28/18: No Coumadin or Lovenox.  07/29/18: Inject Lovenox 60mg  in the fatty abdominal tissue at least 2 inches from the belly button daily at 8am and rotate sites. No Coumadin.  07/30/18: Inject Lovenox in the fatty tissue at 8am. No Coumadin.  07/31/18: Inject Lovenox in the fatty tissue at 8am. No Coumadin.  08/01/18: Inject Lovenox in the fatty tissue in the morning at 8 am. No Coumadin.  08/02/18: Procedure Day - No Lovenox - Resume Coumadin in the evening or as directed by doctor. (take an extra half tablet with usual dose for 2 days then resume normal dose).  08/03/18: Resume Lovenox inject in the fatty tissue at 8am and take Coumadin.  08/04/18: Inject Lovenox in the fatty tissue at 8am and take Coumadin.  08/05/18: Inject Lovenox in the fatty tissue at 8am and take Coumadin.  08/06/18: Inject Lovenox in the fatty tissue at 8am and take Coumadin.  08/07/18: Inject Lovenox in the fatty tissue at 8am and take Coumadin.  08/08/18:: Inject Lovenox in the fatty tissue at 8am and take Coumadin.  08/09/18: Inject Lovenox in the fatty tissue at 8am and report Coumadin appt to check INR.

## 2018-08-09 ENCOUNTER — Ambulatory Visit: Payer: PPO | Admitting: *Deleted

## 2018-08-09 DIAGNOSIS — Z5181 Encounter for therapeutic drug level monitoring: Secondary | ICD-10-CM

## 2018-08-09 DIAGNOSIS — I4821 Permanent atrial fibrillation: Secondary | ICD-10-CM | POA: Diagnosis not present

## 2018-08-09 LAB — POCT INR: INR: 1.6 — AB (ref 2.0–3.0)

## 2018-08-09 NOTE — Patient Instructions (Signed)
Description   Today take 1.5 tablets, tomorrow take 1 tablet, then continue normal dosage of  1/2 tablet every day except 1 tablet on Tuesdays and Saturdays. Continue Lovenox injections once a day.   Recheck INR on Friday.   Call if placed on any new medications or scheduled for any procedures [phone]

## 2018-08-12 ENCOUNTER — Ambulatory Visit (INDEPENDENT_AMBULATORY_CARE_PROVIDER_SITE_OTHER): Payer: PPO | Admitting: *Deleted

## 2018-08-12 DIAGNOSIS — Z5181 Encounter for therapeutic drug level monitoring: Secondary | ICD-10-CM | POA: Diagnosis not present

## 2018-08-12 DIAGNOSIS — I4821 Permanent atrial fibrillation: Secondary | ICD-10-CM | POA: Diagnosis not present

## 2018-08-12 LAB — POCT INR: INR: 1.9 — AB (ref 2.0–3.0)

## 2018-08-12 NOTE — Patient Instructions (Addendum)
Description   Inject Lovenox today and tomorrow daily. Today take 1/2 tablet more of Coumadin (a total of 1 tablet) and tomorrow take 1.5 tablets of Coumadin then continue normal dosage of 1/2 tablet every day except 1 tablet on Tuesdays and Saturdays. Recheck INR on Tuesday.   Call if placed on any new medications or scheduled for any procedures [phone]

## 2018-08-16 ENCOUNTER — Ambulatory Visit: Payer: PPO | Admitting: *Deleted

## 2018-08-16 DIAGNOSIS — Z5181 Encounter for therapeutic drug level monitoring: Secondary | ICD-10-CM

## 2018-08-16 DIAGNOSIS — I4821 Permanent atrial fibrillation: Secondary | ICD-10-CM

## 2018-08-16 LAB — POCT INR: INR: 2 (ref 2.0–3.0)

## 2018-08-16 NOTE — Patient Instructions (Signed)
Description   Start taking 1/2 tablet every day except 1 tablet on Tuesdays, Thursdays, and Saturdays. Recheck INR on 2 weeks. Call if placed on any new medications or scheduled for any procedures [phone]

## 2018-08-30 ENCOUNTER — Other Ambulatory Visit: Payer: Self-pay | Admitting: Physician Assistant

## 2018-08-31 ENCOUNTER — Ambulatory Visit: Payer: PPO | Admitting: *Deleted

## 2018-08-31 DIAGNOSIS — Z5181 Encounter for therapeutic drug level monitoring: Secondary | ICD-10-CM

## 2018-08-31 DIAGNOSIS — I4821 Permanent atrial fibrillation: Secondary | ICD-10-CM

## 2018-08-31 LAB — POCT INR: INR: 2 (ref 2.0–3.0)

## 2018-08-31 NOTE — Patient Instructions (Signed)
Description   Continue  taking 1/2 tablet every day except 1 tablet on Tuesdays, Thursdays, and Saturdays. Recheck INR on 2 weeks. Call if placed on any new medications or scheduled for any procedures [phone]

## 2018-09-15 ENCOUNTER — Ambulatory Visit: Payer: PPO | Admitting: *Deleted

## 2018-09-15 DIAGNOSIS — I4821 Permanent atrial fibrillation: Secondary | ICD-10-CM | POA: Diagnosis not present

## 2018-09-15 DIAGNOSIS — Z5181 Encounter for therapeutic drug level monitoring: Secondary | ICD-10-CM

## 2018-09-15 LAB — POCT INR: INR: 2.3 (ref 2.0–3.0)

## 2018-09-15 NOTE — Patient Instructions (Signed)
Description   Continue  taking 1/2 tablet every day except 1 tablet on Tuesdays, Thursdays, and Saturdays. Recheck INR on 4 weeks. Call if placed on any new medications or scheduled for any procedures [phone]

## 2018-10-01 ENCOUNTER — Other Ambulatory Visit: Payer: Self-pay | Admitting: Cardiovascular Disease

## 2018-10-13 ENCOUNTER — Ambulatory Visit: Payer: PPO

## 2018-10-13 DIAGNOSIS — Z5181 Encounter for therapeutic drug level monitoring: Secondary | ICD-10-CM | POA: Diagnosis not present

## 2018-10-13 DIAGNOSIS — I4821 Permanent atrial fibrillation: Secondary | ICD-10-CM

## 2018-10-13 LAB — POCT INR: INR: 2.8 (ref 2.0–3.0)

## 2018-10-13 NOTE — Patient Instructions (Signed)
Description   Continue on same dosage 1/2 tablet every day except 1 tablet on Tuesdays, Thursdays, and Saturdays. Recheck INR on 4 weeks. Call if placed on any new medications or scheduled for any procedures [phone]

## 2018-10-14 ENCOUNTER — Ambulatory Visit: Payer: PPO | Admitting: Family Medicine

## 2018-10-16 ENCOUNTER — Other Ambulatory Visit: Payer: Self-pay | Admitting: Physician Assistant

## 2018-10-17 ENCOUNTER — Ambulatory Visit (INDEPENDENT_AMBULATORY_CARE_PROVIDER_SITE_OTHER): Payer: PPO | Admitting: Family Medicine

## 2018-10-17 ENCOUNTER — Ambulatory Visit (INDEPENDENT_AMBULATORY_CARE_PROVIDER_SITE_OTHER): Payer: PPO

## 2018-10-17 ENCOUNTER — Encounter: Payer: Self-pay | Admitting: Family Medicine

## 2018-10-17 VITALS — BP 104/80 | HR 114 | Temp 97.9°F | Wt 89.1 lb

## 2018-10-17 DIAGNOSIS — S8391XA Sprain of unspecified site of right knee, initial encounter: Secondary | ICD-10-CM

## 2018-10-17 DIAGNOSIS — N390 Urinary tract infection, site not specified: Secondary | ICD-10-CM

## 2018-10-17 DIAGNOSIS — S8991XA Unspecified injury of right lower leg, initial encounter: Secondary | ICD-10-CM | POA: Diagnosis not present

## 2018-10-17 DIAGNOSIS — M7989 Other specified soft tissue disorders: Secondary | ICD-10-CM | POA: Diagnosis not present

## 2018-10-17 LAB — URINALYSIS, ROUTINE W REFLEX MICROSCOPIC
BILIRUBIN URINE: NEGATIVE
Ketones, ur: NEGATIVE
NITRITE: POSITIVE — AB
Specific Gravity, Urine: 1.015 (ref 1.000–1.030)
Total Protein, Urine: 30 — AB
Urine Glucose: NEGATIVE
Urobilinogen, UA: 0.2 (ref 0.0–1.0)
pH: 6.5 (ref 5.0–8.0)

## 2018-10-17 MED ORDER — CEPHALEXIN 500 MG PO CAPS: 500.0000 mg | ORAL_CAPSULE | Freq: Three times a day (TID) | ORAL | 0 refills | Status: AC

## 2018-10-17 NOTE — Progress Notes (Signed)
   Subjective:    Patient ID: [name], female    DOB: 1926/02/19, 85 y.o.   MRN: [id number]  HPI Here with her daughter for a possible UTI and for injuries from a fall at home that occurred 3 days ago. She has been having increased frequency and urgency of urination for several weeks, though no fever or burning. Then 3 days ago another daughter went to her house and found the patient lying on the living room floor. She had bumped her head against something and twisted her right knee apparently, but [name] cannot remember exactly what happened. Since then she has had swelling and pain in the right knee, and it hurts to put weight on it. Her forehead is a bit sore, but she denies any headache or dizziness or nausea or vision changes.    Review of Systems  Constitutional: Negative.   Respiratory: Negative.   Cardiovascular: Negative.   Gastrointestinal: Negative.   Genitourinary: Positive for frequency and urgency. Negative for dysuria and flank pain.  Neurological: Negative.        Objective:   Physical Exam Constitutional:      Comments: Frail, limping, using her walker   Neck:     Musculoskeletal: Normal range of motion.  Cardiovascular:     Rate and Rhythm: Normal rate and regular rhythm.     Pulses: Normal pulses.     Heart sounds: Normal heart sounds.  Pulmonary:     Effort: Pulmonary effort is normal.     Breath sounds: Normal breath sounds.  Abdominal:     General: Abdomen is flat. Bowel sounds are normal. There is no distension.     Palpations: Abdomen is soft. There is no mass.     Tenderness: There is no abdominal tenderness. There is no guarding or rebound.     Hernia: No hernia is present.  Musculoskeletal:     Comments: Xrays of the right knee show generative changes but no fractures   Neurological:     General: No focal deficit present.     Mental Status: She is alert and oriented to person, place, and time.           Assessment & Plan:  Knee sprain  from a recent fall. She may have some torn cartilage but there are no apparent fractures. She will wear a support sleeve and apply ice as needed. Take Tylenol prn. She will let us know if things do not improve much in the coming week. She likely has a UTI so we will treat this wit Keflex. Culture the sample.  Alysia Penna, MD

## 2018-10-19 ENCOUNTER — Other Ambulatory Visit: Payer: Self-pay | Admitting: Physician Assistant

## 2018-11-09 ENCOUNTER — Ambulatory Visit: Payer: PPO | Admitting: *Deleted

## 2018-11-09 DIAGNOSIS — Z5181 Encounter for therapeutic drug level monitoring: Secondary | ICD-10-CM | POA: Diagnosis not present

## 2018-11-09 DIAGNOSIS — I4821 Permanent atrial fibrillation: Secondary | ICD-10-CM

## 2018-11-09 LAB — POCT INR: INR: 3.9 — AB (ref 2.0–3.0)

## 2018-11-09 NOTE — Patient Instructions (Signed)
Description   Do not take any Coumadin tomorrow then continue on same dosage 1/2 tablet every day except 1 tablet on Tuesdays, Thursdays, and Saturdays. Recheck INR on 2 weeks. Call if placed on any new medications or scheduled for any procedures [phone]

## 2018-11-16 ENCOUNTER — Encounter: Payer: Self-pay | Admitting: Family Medicine

## 2018-11-17 NOTE — Telephone Encounter (Signed)
Tell her to keep it covered for 3 days, then let it air out for parts of the day after that. We can certainly take a look at it if she wants also

## 2018-11-18 ENCOUNTER — Ambulatory Visit: Payer: PPO | Admitting: Family Medicine

## 2018-11-19 ENCOUNTER — Encounter (HOSPITAL_COMMUNITY): Payer: Self-pay | Admitting: Emergency Medicine

## 2018-11-19 ENCOUNTER — Ambulatory Visit (HOSPITAL_COMMUNITY)
Admission: EM | Admit: 2018-11-19 | Discharge: 2018-11-19 | Disposition: A | Discharge status: Home / Self Care | Payer: PPO | Attending: Family Medicine | Admitting: Family Medicine

## 2018-11-19 DIAGNOSIS — S81801A Unspecified open wound, right lower leg, initial encounter: Secondary | ICD-10-CM | POA: Diagnosis not present

## 2018-11-19 MED ORDER — MUPIROCIN 2 % EX OINT: 1.0000 "application " | TOPICAL_OINTMENT | Freq: Two times a day (BID) | CUTANEOUS | 0 refills | Status: DC

## 2018-11-19 NOTE — ED Triage Notes (Signed)
Pt here for wound check to right shin after hitting 4 days ago

## 2018-11-19 NOTE — ED Provider Notes (Signed)
[place]    CSN: [id number] Arrival date & time: 11/19/18  1712     History   Chief Complaint Chief Complaint  Patient presents with  . Wound Check    HPI [name] is a 85 y.o. female.   85 year old female comes in with daughter for 4 to 5-day history of skin avulsion/injury to the right shin.  Patient was trying to get into the car, slipped, and scraped anterior shin on the door.  Denies fall, head injury, loss of consciousness.  Wound was cleaned with antiseptic soap and dressed daily with antibacterial ointment.  Denies fever, chills, night sweats.  Daughter states noticed increasing erythema, and brought patient in for evaluation.  Patient denies worsening pain. Last tetanus 2011.      Past Medical History:  Diagnosis Date  . Aortic stenosis    a. mild on echo 10/2013  . Blood clotting disorder ([place])   . Bradycardia    a. Holter (1/15):  avg HR 78, 1.4% PVCs, several pauses </= 3 sec, b.  Holter (01/2014):  AFib, freq PVCs, HR 51-127, Avg HR 88, longest pause 2.9 sec - no changes made  . Carotid stenosis    a. Carotid US (4/14):  RICA 40-59%;  b. Carotid US (01/2014) bilateral 1-39%  . Chronic atrial fibrillation    a. permanent;  b. chronic coumadin;  c. 10/2013 digoxin and atenolol d/c'd 2/2 pauses of <3 sec;  d. 01/2014 digoxin resumed @ 0.125 mg in setting of difficult to control rate and diast chf.  . Chronic diastolic heart failure ([place])    a. Echo (6/08):  EF 55-60%, mild MR;  b. Echo (10/2013):  EF 55%, mild AS  . Contact dermatitis and other eczema, due to unspecified cause    a. h/o myalgias with atorvastatin.  . Diverticulosis of colon (without mention of hemorrhage)   . DJD (degenerative joint disease)   . Frequent UTI    Dr. Terance Hart sees pt  . Gout, unspecified   . H/O: CVA (cardiovascular accident) 2008  . History of CVA (cerebrovascular accident)    2008  . History of melanoma    on right ear  . Hyperlipidemia   . Hypertension    a. hx of edema with Amlodipine.  . Hypothyroidism   . Lumbago    chronic low back pain  . Osteoporosis    las DEXA on 01/30/10  . Skin cancer    forehead  . Urticaria, unspecified     Patient Active Problem List   Diagnosis Date Noted  . COPD (chronic obstructive pulmonary disease) with emphysema ([place]) 11/22/2017  . Encounter for therapeutic drug monitoring 11/03/2013  . Chronic diastolic CHF (congestive heart failure) ([place]) 10/10/2013  . Personal history of colonic polyps 10/14/2011  . Anemia 10/07/2011  . Long term (current) use of anticoagulants 06/03/2011  . Internal hemorrhoids without mention of complication 01/60/1093  . OSTEOPOROSIS 08/06/2010  . Carotid Stenosis 05/06/2010  . CEREBRAL EMBOLISM WITH CEREBRAL INFARCTION 05/28/2009  . ECZEMA 10/29/2008  . UTI'S, RECURRENT 08/28/2008  . LOW BACK PAIN, CHRONIC 01/31/2008  . LEG PAIN, BILATERAL 01/09/2008  . Hypothyroidism 10/18/2007  . HYPERLIPIDEMIA 10/18/2007  . GOUT 10/18/2007  . Essential hypertension 10/18/2007  . Permanent atrial fibrillation 10/18/2007  . DIVERTICULOSIS, COLON 10/18/2007  . Osteoarthritis 10/18/2007    Past Surgical History:  Procedure Laterality Date  . ABDOMINAL HYSTERECTOMY    . BAND HEMORRHOIDECTOMY  last on 10-28-11   x 2, per Dr. Deatra Ina   .  COLONOSCOPY  07-02-11   Dr. Deatra Ina, adenomatous polyp, no repeats are planned   . FLEXIBLE SIGMOIDOSCOPY  10/28/2011   Procedure: FLEXIBLE SIGMOIDOSCOPY;  Surgeon: Inda Castle, MD;  Location: [facility];  Service: Endoscopy;  Laterality: N/A;  . GASTRIC VARICES BANDING  10/28/2011   Procedure: HEMORRHOID BANDING;  Surgeon: Inda Castle, MD;  Location: [facility];  Service: Endoscopy;  Laterality: N/A;  . SKIN CANCER EXCISION  2001   removed right ear per Dr. Denna Haggard, skin grafts harvested from right cheek  . TONSILLECTOMY      OB History   No obstetric history on file.      Home Medications    Prior to Admission medications     Medication Sig Start Date End Date Taking? Authorizing Provider  allopurinol (ZYLOPRIM) 100 MG tablet Take 100 mg by mouth daily.    [provider]  diltiazem (CARDIZEM CD) 240 MG 24 hr capsule TAKE 1 CAPSULE BY MOUTH EVERY DAY 10/19/18   [name] T, PA-C  furosemide (LASIX) 20 MG tablet TAKE 1 TABLET BY MOUTH EVERY OTHER DAY 10/17/18   Weaver, Scott T, PA-C  KLOR-CON M10 10 MEQ tablet TAKE 1 TABLET (10 MEQ TOTAL) BY MOUTH 2 (TWO) TIMES DAILY. 08/30/18   Bhagat, Crista Luria, PA  levothyroxine (SYNTHROID, LEVOTHROID) 50 MCG tablet Take 1 tablet (50 mcg total) by mouth daily. 09/17/17   Laurey Morale, MD  metoprolol succinate (TOPROL-XL) 50 MG 24 hr tablet Take 1 tablet (50 mg total) by mouth 2 (two) times daily. 01/25/18   [name] T, PA-C  mupirocin ointment (BACTROBAN) 2 % Apply 1 application topically 2 (two) times daily. 11/19/18   Tasia Catchings, Shaun Zuccaro V, PA-C  pravastatin (PRAVACHOL) 10 MG tablet TAKE ONE (1) TABLET (10 MG) BY MOUTH THREE (3) TIMES A WEEK (MONDAYS, WEDNESDAYS, AND FRIDAYS). 06/07/18   Fay Records, MD  warfarin (COUMADIN) 5 MG tablet TAKE 1 TABLET (5 MG TOTAL) BY MOUTH AS DIRECTED. 01/05/18   Larey Dresser, MD    Family History Family History  Problem Relation Age of Onset  . Colon polyps Mother   . Hypertension Mother   . Stroke Father   . Coronary artery disease Unknown        sister  . COPD Unknown        sister  . Immunodeficiency Unknown        sister  . Anemia Unknown        sister  . Anemia Sister   . Colon cancer Neg Hx   . Heart attack Neg Hx     Social History Social History   Tobacco Use  . Smoking status: Former Smoker    Last attempt to quit: 10/05/1994    Years since quitting: 24.1  . Smokeless tobacco: Never Used  Substance Use Topics  . Alcohol use: No    Alcohol/week: 0.0 standard drinks  . Drug use: No     Allergies   Amlodipine; Amoxicillin; Hydrocodone-acetaminophen; Nitrofurantoin; and Sulfonamide derivatives   Review of  Systems Review of Systems  Reason unable to perform ROS: See HPI as above.     Physical Exam Triage Vital Signs ED Triage Vitals [11/19/18 1758]  Enc Vitals Group     BP (!) 146/97     Pulse Rate 100     Resp 18     Temp 98.1 F (36.7 C)     Temp Source Temporal     SpO2 99 %  Weight      Height      Head Circumference      Peak Flow      Pain Score 2     Pain Loc      Pain Edu?      Excl. in [place]?    No data found.  Updated Vital Signs BP (!) 146/97 (BP Location: Right Arm)   Pulse 100   Temp 98.1 F (36.7 C) (Temporal)   Resp 18   SpO2 99%   Physical Exam Constitutional:      General: She is not in acute distress.    Appearance: She is well-developed. She is not diaphoretic.  HENT:     Head: Normocephalic and atraumatic.  Eyes:     Conjunctiva/sclera: Conjunctivae normal.     Pupils: Pupils are equal, round, and reactive to light.  Skin:    General: Skin is warm and dry.     Comments: See pictures below.  No obvious swelling.  Mild tenderness to palpation around skin avulsion.  No tenderness to palpation of surrounding erythema.  No increase in warmth.  No fluctuance.  Neurological:     Mental Status: She is alert and oriented to person, place, and time.            UC Treatments / Results  Labs (all labs ordered are listed, but only abnormal results are displayed) Labs Reviewed - No data to display  EKG None  Radiology No results found.  Procedures Procedures (including critical care time)  Medications Ordered in UC Medications - No data to display  Initial Impression / Assessment and Plan / UC Course  I have reviewed the triage vital signs and the nursing notes.  Pertinent labs & imaging results that were available during my care of the patient were reviewed by me and considered in my medical decision making (see chart for details).    Discussed case with Dr. Meda Coffee.  No signs of cellulitis at this time.  Wound care instructions  given.  Return precautions given.  Daughter expresses understanding and agrees to plan.  Final Clinical Impressions(s) / UC Diagnoses   Final diagnoses:  Avulsion of skin of right lower leg, initial encounter    ED Prescriptions    Medication Sig Dispense Auth. Provider   mupirocin ointment (BACTROBAN) 2 % Apply 1 application topically 2 (two) times daily. 22 g Tobin Chad, [place] 11/19/18 1826

## 2018-11-19 NOTE — Discharge Instructions (Signed)
No signs of infection. Continue daily dressings. Keep wound clean and dry. You can clean gently with soap and water. Monitor for spreading redness, increased swelling, increased pain, fever, follow up for reevaluation. Otherwise, follow up with PCP for monitoring of wound healing as needed.

## 2018-11-25 ENCOUNTER — Ambulatory Visit (INDEPENDENT_AMBULATORY_CARE_PROVIDER_SITE_OTHER): Payer: PPO

## 2018-11-25 DIAGNOSIS — I4821 Permanent atrial fibrillation: Secondary | ICD-10-CM

## 2018-11-25 DIAGNOSIS — Z5181 Encounter for therapeutic drug level monitoring: Secondary | ICD-10-CM

## 2018-11-25 LAB — POCT INR: INR: 4.4 — AB (ref 2.0–3.0)

## 2018-11-25 NOTE — Patient Instructions (Signed)
Description   Missed yesterday's dosage of Coumadin.  Skip today's dosage of Coumadin, then start taking 1/2 tablet every day except 1 tablet on Tuesdays and Saturdays. Recheck INR on 2 weeks. Call if placed on any new medications or scheduled for any procedures [phone]

## 2018-12-01 ENCOUNTER — Other Ambulatory Visit: Payer: Self-pay | Admitting: Physician Assistant

## 2018-12-01 DIAGNOSIS — I482 Chronic atrial fibrillation, unspecified: Secondary | ICD-10-CM

## 2018-12-01 DIAGNOSIS — I10 Essential (primary) hypertension: Secondary | ICD-10-CM

## 2018-12-08 ENCOUNTER — Ambulatory Visit (INDEPENDENT_AMBULATORY_CARE_PROVIDER_SITE_OTHER): Payer: PPO | Admitting: Pharmacist

## 2018-12-08 DIAGNOSIS — Z5181 Encounter for therapeutic drug level monitoring: Secondary | ICD-10-CM

## 2018-12-08 DIAGNOSIS — I4821 Permanent atrial fibrillation: Secondary | ICD-10-CM

## 2018-12-08 LAB — POCT INR: INR: 4.2 — AB (ref 2.0–3.0)

## 2018-12-08 NOTE — Patient Instructions (Signed)
Description   Skip today's dosage of Coumadin, then start taking 1/2 tablet every day. Recheck INR on 2 weeks. Call if placed on any new medications or scheduled for any procedures [phone]

## 2018-12-16 ENCOUNTER — Ambulatory Visit: Payer: Self-pay

## 2018-12-16 ENCOUNTER — Encounter: Payer: Self-pay | Admitting: Family Medicine

## 2018-12-16 MED ORDER — CEPHALEXIN 500 MG PO CAPS: 500.0000 mg | ORAL_CAPSULE | Freq: Three times a day (TID) | ORAL | 0 refills | Status: DC

## 2018-12-16 NOTE — Telephone Encounter (Signed)
Patient's daughter [name] called and says the patient was prescribed Keflex earlier today by Dr. Sarajane Jews for a possible UTI, because the patient is confused and not her normal self. She says they could only get one dose of the antibiotic in. She says the patient is confused, no knowing where she is, where she lives and is walking around outside in her pajamas. She says she's normally with it and is asking what does she need to do. She says she was only able to get one dose of antibiotic in her. She asks how long will the confusion last. I advised that confusion is sign of UTI in elderly and sometimes the confusion can last for days after the UTI and antibiotic treatment, but will usually clear up once the infection clears. I advised if the patient is not taking anything by mouth, the she may have to consider the ED to have her evaluated, labs, urine sample, medications. I advised that to keep her safe, someone will need to be with her. I advised to try and get her to take the medications by either crushing/opening if it's a capsule, and adding it to whatever the patient will eat. I advised if she's not taking anything by mouth, to take her to the ED for evaluation, daughter says she will try giving it to her by mouth and if no luck will have her to go to the ED.  Reason for Disposition . [1] Follow-up call to recent contact AND [2] information only call, no triage required  Protocols used: INFORMATION ONLY CALL-A-AH

## 2018-12-16 NOTE — Telephone Encounter (Signed)
Call in Keflex 500 mg tid for 10 days

## 2018-12-16 NOTE — Telephone Encounter (Signed)
Dr. Sarajane Jews please advise on UTI.  Thanks

## 2018-12-19 ENCOUNTER — Ambulatory Visit: Payer: PPO | Admitting: Family Medicine

## 2018-12-22 ENCOUNTER — Telehealth: Payer: Self-pay | Admitting: Family Medicine

## 2018-12-22 NOTE — Telephone Encounter (Signed)
Out going call to Patients daughter,who states that Patient was taking 500mg  of of Keflex  For a bad UTI.  The other day Patient left her house.  Manager of apartment found her.  Oldest daughter  gave her one imodium.  Has not had any more stools.  Stool is no longer dark .  Denies any odor. Patient has received 17 out of 30 doses.  Daughter has decided to discontinue the Keflex, wanted to let the office know.  Has been eating soup, crackers,drinking ensure,and Pedialyte and 6oz coke.  Patient got up early but went back to bed now napping. No distress noted.

## 2018-12-22 NOTE — Telephone Encounter (Signed)
    [name] Female, 85 y.o., 20-Jan-1926 MRN:  [id number] Phone:  [phone] (H) PCP:  Laurey Morale, MD Coverage:  [employer] P[employer] Next Appt With Cardiology 12/22/2018 at 2:15 PM Message from Berneta Levins sent at 12/22/2018 9:13 AM EDT   Summary: diarrhea   Pt's daughter, Marliss Czar, calling in. States that since being on cephALEXin (KEFLEX) 500 MG capsule pt has had diarrhea and some nausea. Pt's family is worried that is loosing weight and wants to know what to do about this. States that UTI that pt was RXd medication for seems to be clearing up and pt less confused now. Marliss Czar is not with pt, but pt's other daughter, Judeen Hammans, is with pt at this time.  Please call Judeen Hammans at [phone]/[facility]' home phone [phone] Marliss Czar can be reached at [phone]        Call History    Type Contact Phone  12/22/2018 09:10 AM Phone (Incoming) [place] (Emergency Contact) [phone]  User: Berneta Levins

## 2018-12-22 NOTE — Telephone Encounter (Signed)
This encounter was created in error - please disregard.

## 2018-12-23 ENCOUNTER — Telehealth: Payer: Self-pay | Admitting: *Deleted

## 2018-12-23 NOTE — Telephone Encounter (Signed)
1. Have you recently travelled abroad or to [place], [place], or [place]? No 2. Do you currently have a fever? No 3. Have you been in contact with someone that is currently pending confirmation of COVID19 testing or has been confirmed to have the COVID19 virus? No 4. Are you currently experiencing fatigue or cough? No 5. Are you currently experiencing new or worsening shortness of breath at rest or with minimal activity? No 6. Have you been in contact with someone that was recently sick with fever/cough/fatigue? No   **A score of 4 or more should result in cancellation of the pts cardiology appt  **A score of 2 should be provided a mask prior to admission into the lobby  **TRAVEL to a high risk area or contact with a confirmed case should stay at home, away from confirmed patient, monitor symptoms, and reach out to PCP for evisit, additional testing.   **ALL PTS WITH FEVER SHOULD BE REFERRED TO PCP FOR EVISIT  Pt. Advised that we are restricting visitors at this time and request that only patients present for check-in prior to their appointment. All other visitors should remain in their car. If necessary, only one visitor may come with the patient into the building. For everyone's safety, all patients and visitors entering our practice area should expect to be screened again prior to entering our waiting area.   Discussed transitioning to [place] and pt will come upstairs for labs.

## 2018-12-26 ENCOUNTER — Ambulatory Visit (INDEPENDENT_AMBULATORY_CARE_PROVIDER_SITE_OTHER): Payer: PPO | Admitting: Pharmacist

## 2018-12-26 ENCOUNTER — Other Ambulatory Visit: Payer: Self-pay

## 2018-12-26 ENCOUNTER — Other Ambulatory Visit: Payer: PPO | Admitting: *Deleted

## 2018-12-26 DIAGNOSIS — I4821 Permanent atrial fibrillation: Secondary | ICD-10-CM

## 2018-12-26 DIAGNOSIS — Z5181 Encounter for therapeutic drug level monitoring: Secondary | ICD-10-CM | POA: Diagnosis not present

## 2018-12-26 LAB — BASIC METABOLIC PANEL
BUN/Creatinine Ratio: 14 (ref 12–28)
BUN: 12 mg/dL (ref 10–36)
CO2: 22 mmol/L (ref 20–29)
Calcium: 8.9 mg/dL (ref 8.7–10.3)
Chloride: 103 mmol/L (ref 96–106)
Creatinine, Ser: 0.86 mg/dL (ref 0.57–1.00)
GFR calc Af Amer: 69 mL/min/{1.73_m2} (ref >59)
GFR calc non Af Amer: 60 mL/min/{1.73_m2} (ref >59)
Glucose: 90 mg/dL (ref 65–99)
Potassium: 4.5 mmol/L (ref 3.5–5.2)
Sodium: 138 mmol/L (ref 134–144)

## 2018-12-26 LAB — CBC
Hematocrit: 33.9 % — ABNORMAL LOW (ref 34.0–46.6)
Hemoglobin: 11.7 g/dL (ref 11.1–15.9)
MCH: 33.2 pg — ABNORMAL HIGH (ref 26.6–33.0)
MCHC: 34.5 g/dL (ref 31.5–35.7)
MCV: 96 fL (ref 79–97)
Platelets: 372 10*3/uL (ref 150–450)
RBC: 3.52 x10E6/uL — ABNORMAL LOW (ref 3.77–5.28)
RDW: 12.7 % (ref 11.7–15.4)
WBC: 6.9 10*3/uL (ref 3.4–10.8)

## 2018-12-26 LAB — POCT INR: INR: 4.9 — AB (ref 2.0–3.0)

## 2018-12-26 MED ORDER — APIXABAN 2.5 MG PO TABS: 2.5000 mg | ORAL_TABLET | Freq: Two times a day (BID) | ORAL | 1 refills | Status: DC

## 2018-12-26 MED ORDER — APIXABAN 2.5 MG PO TABS: 2.5000 mg | ORAL_TABLET | Freq: Two times a day (BID) | ORAL | 0 refills | Status: DC

## 2018-12-26 NOTE — Telephone Encounter (Signed)
Tell her to stop the Keflex and see how she does

## 2018-12-26 NOTE — Telephone Encounter (Signed)
Called and spoke with pts daughter Judeen Hammans and she stated that the pt is doing very well.

## 2018-12-26 NOTE — Telephone Encounter (Signed)
Noted  

## 2018-12-26 NOTE — Telephone Encounter (Signed)
Dr. Fry---I spoke with the pts daughter and she stated that the keflex that was given to her was way too strong for her system and wanted to see if they can change the keflex next time to maybe 250 mg BID.

## 2018-12-26 NOTE — Patient Instructions (Addendum)
Description   Stop taking warfarin. Start taking Eliquis 2.5mg  twice daily - first dose will be Friday 3/27 in the morning. Recheck lab work in 3 months - Monday June 29th any time after 7:30am.

## 2018-12-26 NOTE — Telephone Encounter (Signed)
Dr Fry please advise. thanks 

## 2019-01-04 ENCOUNTER — Telehealth: Payer: Self-pay | Admitting: Family Medicine

## 2019-01-04 ENCOUNTER — Encounter: Payer: Self-pay | Admitting: Family Medicine

## 2019-01-04 DIAGNOSIS — R627 Adult failure to thrive: Secondary | ICD-10-CM

## 2019-01-04 NOTE — Telephone Encounter (Signed)
Called and spoke with pts daughter Marliss Czar and she stated that since the last UTI that the pt had she has been eating less and is more weak.  She will sleep and stay in her chair and does get up to move around some.  Family is not sure what to do---if they need to get palliative care set up for her.   They have set up cameras so they can keep an eye on the pt at times.  Dr. Sarajane Jews, please advise

## 2019-01-04 NOTE — Telephone Encounter (Signed)
I don't think that Hospice is appropriate since I do not think she has 6 months to live, but she would be an ideal candidate for Palliative Care (the services are just about the same). I can do a referral if she wants to

## 2019-01-04 NOTE — Telephone Encounter (Signed)
Duplicate message.  Referral to palliative care has been placed and my chart message has been sent to the family to make them aware.

## 2019-01-04 NOTE — Telephone Encounter (Signed)
Copied from [place] [phone]. Topic: Quick Communication - See Telephone Encounter >> Jan 04, 2019 11:11 AM Reyne Dumas L wrote: CRM for notification. See Telephone encounter for: 01/04/19.  Pt's daughter, [name], called in and left message on [facility] 01/02/2019.  States they are concerned with pt's decline and wonder if it is time for Hospice.  Pt just sits in chair and sleeps all the time. [name] can be reached at [phone].

## 2019-01-04 NOTE — Telephone Encounter (Signed)
I did the referral in Epic

## 2019-01-05 ENCOUNTER — Telehealth: Payer: Self-pay

## 2019-01-05 NOTE — Telephone Encounter (Signed)
Phone call placed to daughter to introduce Palliative Care and to schedule a visit with NP. Due to the current COVID-19 infection/crises, the patient and family prefer, and have given their verbal consent for, a provider visit via telemedicine. HIPPA policies of confidentially were discussed and patient/family expressed understanding.  It was mentioned that we may be able to video conference, by sending the link via  patient's e-mail. Tele health visit scheduled for 01/06/2019 @ 11 am

## 2019-01-06 ENCOUNTER — Other Ambulatory Visit: Payer: PPO | Admitting: Internal Medicine

## 2019-01-06 ENCOUNTER — Other Ambulatory Visit: Payer: Self-pay

## 2019-01-06 DIAGNOSIS — Z515 Encounter for palliative care: Secondary | ICD-10-CM

## 2019-01-06 NOTE — Progress Notes (Signed)
April 3rd, 2020 [facility] Consult Note Telephone: [phone]  Fax: [phone]  *Due to the COVID-19 crisis, this visit was done via telemedicine and it was initiated by and consented to, the patient and her family.   PATIENT NAME: [name] DOB: 1926/08/02 MRN: [id number]  Home: [address] [place] Phone: [phone] or [phone]/cell  PRIMARY CARE PROVIDER:   Laurey Morale, MD  CVD clinic (last OV 12/26/18)   Dr. Nevin Bloodgood Ross/Scott Jorene Minors (cardiology)  REFERRING PROVIDER:  Laurey Morale, MD [place], [place] [address]  RESPONSIBLE PARTY:   Primary contact: *Daughter-Tami [phone] Email address: [email or web address] (dtr) [place]  [phone] (oldert dtr) Judeen Hammans.  ASSESSMENT and RECOMMENDATIONS:    1. Gradual cognitive and functional decline: Patient reports episodes of transient confusion (disorientation with place/events/time) which can last as long as 2 hours, and may occur on a weekly basis. Daughters report they are eventually able to reorient her. She had a prolonged episode earlier this month which improved when treated for a UTI. Baseline she is alert and oriented X 3. She is independent in transfers, hygiene (though daughters assist with showers), dressing, and feeding. She ambulates independently with a cane, with occasional walker use when traveling outside of the home. Daughters report patient seems weaker. She has DOE; no orthopnea. She has had no recent falls. Her weight 10/17/2018 was  89 lbs. At a hight of 5'3" her BMI Is 15.79 kg/m2. Weights have been stable mid to high 80's since April 2019. Family report she consumes 50% of 2 small meals a day. Patient had very loose stools of uncertain etiology for the last 3 weeks or so. She was utilizing imodium and supplementing with Pedialyte. Happily, her stools have been formed and normal for the last few days without further  imodium use. Patient had a left shin injury form an abrasion a few months ago; site healing well without signs of infection. -I taught the daughters how to check patient's radial pule and rate, to monitor if bradycardia present at times of confusion (has a h/o bradycardia in the past; digoxin was discontinued at that time). We also discussed giving patient sips of OJ or sweet liquid just incase her blood sugar was low at those times, as well.  -discussed increasing activity as able. Patient has a long hallway outside her door with a banister on each side. She's thinking of ambulating qd perhaps with a neighbor, towards increasing her endurance. Also, as she enjoys watching TV, she will do reps of standing and sitting whenever a commercial comes on.  2. 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 Her daughters [name] and [name] live within 5 min and 15 min away. They visit the patient every morning and evening to assist with food prep, etc. Housekeeping services q month. She has a life alert necklace which she can push to activate for assistance. [place] a Freight forwarder / Financial risk analyst who organized weekly activities for the residents which patient enjoyed, but that has been on hold d/t the COVID-19 cirsis,   3. Goals of Care: Maintain function and independence as long as possible.  4. Advanced Care Directives: Discussed DNR with patient, who states they have had these discussions in the past. Patient and daughters are in agreement with a DNR status. -will follow up with MOST form discussion, in future visits.  -I sent an e-mail  to daughter [name] asking if the DNR form was in the home. I forgot to check. Will f/u nxt visit.  5. Follow up: PC NP telemedicine visit Mon May 4th at 11am.  I spent 60 minutes providing this consultation,  from 11am to noon. More than 50% of the time in this consultation was spent coordinating communication, interview of  patient and family, review of medications, and charting.   HISTORY OF PRESENT ILLNESS:  [name] is a 85 y.o. female with medical history of mild aortic stenosis, bradycardia, carotid stenosis, chronic afib (Eliquis), chronic atrial fibrillation, chronic diastolic heart failure, COPD, diverticulosis, DJD, frequent UTIs, gout, CVA, hypothyroidism, HTN  melanoma R ear, HLD, Lumbago (chronic low back pain, osteoporosis, skin cancer (forehead), and gastric Varices Banding (2013) Palliative Care was asked to help address goals of care.   CODE STATUS: DNR  PPS: 60% HOSPICE ELIGIBILITY/DIAGNOSIS: No, as current prognosis appears greater than 6 months.  PAST MEDICAL HISTORY:  Past Medical History:  Diagnosis Date  . Aortic stenosis    a. mild on echo 10/2013  . Blood clotting disorder ([facility])   . Bradycardia    a. Holter (1/15):  avg HR 78, 1.4% PVCs, several pauses </= 3 sec, b.  Holter (01/2014):  AFib, freq PVCs, HR 51-127, Avg HR 88, longest pause 2.9 sec - no changes made  . Carotid stenosis    a. Carotid US (4/14):  RICA 40-59%;  b. Carotid US (01/2014) bilateral 1-39%  . Chronic atrial fibrillation    a. permanent;  b. chronic coumadin;  c. 10/2013 digoxin and atenolol d/c'd 2/2 pauses of <3 sec;  d. 01/2014 digoxin resumed @ 0.125 mg in setting of difficult to control rate and diast chf.  . Chronic diastolic heart failure ([place])    a. Echo (6/08):  EF 55-60%, mild MR;  b. Echo (10/2013):  EF 55%, mild AS  . Contact dermatitis and other eczema, due to unspecified cause    a. h/o myalgias with atorvastatin.  . Diverticulosis of colon (without mention of hemorrhage)   . DJD (degenerative joint disease)   . Frequent UTI    Dr. Terance Hart sees pt  . Gout, unspecified   . H/O: CVA (cardiovascular accident) 2008  . History of CVA (cerebrovascular accident)    2008  . History of melanoma    on right ear  . Hyperlipidemia   . Hypertension    a. hx of edema with Amlodipine.  . Hypothyroidism    . Lumbago    chronic low back pain  . Osteoporosis    las DEXA on 01/30/10  . Skin cancer    forehead  . Urticaria, unspecified     SOCIAL HX:  Social History   Tobacco Use  . Smoking status: Former Smoker    Last attempt to quit: 10/05/1994    Years since quitting: 24.2  . Smokeless tobacco: Never Used  Substance Use Topics  . Alcohol use: No    Alcohol/week: 0.0 standard drinks    ALLERGIES:  Allergies  Allergen Reactions  . Amlodipine     Has significant edema  . Amoxicillin     Nightmares, different mental state  . Hydrocodone-Acetaminophen     Nightmares, different mental state  . Nitrofurantoin     unknown  . Sulfonamide Derivatives Swelling    Redness      PERTINENT MEDICATIONS:  Outpatient Encounter Medications as of 01/06/2019  Medication Sig  . allopurinol (ZYLOPRIM) 100 MG tablet Take 100 mg  by mouth daily.  [name] apixaban (ELIQUIS) 2.5 MG TABS tablet Take 1 tablet (2.5 mg total) by mouth 2 (two) times daily.  [name] diltiazem (CARDIZEM CD) 240 MG 24 hr capsule TAKE 1 CAPSULE BY MOUTH EVERY DAY  . furosemide (LASIX) 20 MG tablet TAKE 1 TABLET BY MOUTH EVERY OTHER DAY  . KLOR-CON M10 10 MEQ tablet TAKE 1 TABLET (10 MEQ TOTAL) BY MOUTH 2 (TWO) TIMES DAILY.  [name] levothyroxine (SYNTHROID, LEVOTHROID) 50 MCG tablet Take 1 tablet (50 mcg total) by mouth daily.  . metoprolol succinate (TOPROL-XL) 50 MG 24 hr tablet TAKE 1 TABLET BY MOUTH TWICE A DAY  . mupirocin ointment (BACTROBAN) 2 % Apply 1 application topically 2 (two) times daily.  . pravastatin (PRAVACHOL) 10 MG tablet TAKE ONE (1) TABLET (10 MG) BY MOUTH THREE (3) TIMES A WEEK (MONDAYS, WEDNESDAYS, AND FRIDAYS).   No facility-administered encounter medications on file as of 01/06/2019.     PHYSICAL EXAM:  Abreaviated exam d/t telemedicine General: NAD, frail appearing, thin. Bright and alert; occasionally forgetful. A & ) x 3. Her two daughters are in attendance at patient's home Extremities: no joint deformities.  No LE edema. Skin: Bilateral LE skin changes of chronic venous stasis.  Neurological: Weakness but otherwise nonfocal  Julianne Handler, NP

## 2019-01-07 ENCOUNTER — Encounter: Payer: Self-pay | Admitting: Internal Medicine

## 2019-01-09 ENCOUNTER — Telehealth: Payer: Self-pay | Admitting: Internal Medicine

## 2019-01-09 NOTE — Telephone Encounter (Signed)
E-mail coorspondence:   On Mon, Jan 09, 2019, 1:43 PM Violeta Gelinas <[email or web address]> wrote: Hi Tami!   I would be happy to mail this out! I missed this morning's mail delivery, but will post tomorrow (Tues AM) so would expect it to arrive later this week. Let me know if you don't receive ?? I will mail two copies to your mom's address.  I recommend sticking one copy (can be folded) on your mom's fridge, and the other copy to accompany her if she travels out of the home (just in case!). The fridge placement may seem strange, but that is the first place EMS looks for that DNR sheet, should family be uncertain where that paper work is.  Till we meet again! Violeta Gelinas, NP, [facility] [phone], [phone]  From: Adron Bene <[email or web address]>  Sent: Friday, January 06, 2019 6:49 PM To: Mary Serpe [email or web address]> Subject: [External] Re: DNR form in home?  Thanks Stanton Kidney for asking!  I don't think we do, can you send [place] one to her address?  Thanks so much! it makes [place] feel better just talking to you today.   On Fri, Jan 06, 2019, 2:13 PM Violeta Gelinas [email or web address]> wrote: Hi Tami! It was great to have had the opportunity to have met your mom, you, and your sister! I forgot to ask. Does your mom have a DNR form in the home? It would be a bright yellow, golden rod colored form.  If not, I can mail her the form. Would it be better for me to mail it to your address? Though of course, the form should ultimately be kept with your mom. Typically it is placed on the fridge (ok to fold up). That's where EMS would look for it if they ever had to be involved.  See you on May 4th at 11am via telemedicine ?? Thank you! Violeta Gelinas, [place], [place], [phone]

## 2019-01-17 ENCOUNTER — Ambulatory Visit: Payer: PPO | Admitting: Physician Assistant

## 2019-01-20 ENCOUNTER — Other Ambulatory Visit: Payer: PPO | Admitting: Internal Medicine

## 2019-01-20 ENCOUNTER — Encounter: Payer: Self-pay | Admitting: Internal Medicine

## 2019-01-20 DIAGNOSIS — Z515 Encounter for palliative care: Secondary | ICD-10-CM

## 2019-01-20 NOTE — Progress Notes (Signed)
April 17th, 2020 [name] Consult Note Telephone: [phone]  Fax: [phone]  *Due to the COVID-19 crisis, this visit was done via telemedicine and it was initiated by and consented to, the patient and her family.   PATIENT NAME: [name] DOB: 11/28/1925 MRN: [id number]  Home: [address] [place] Phone: [phone] or [phone]/cell  PRIMARY CARE PROVIDER:   Laurey Morale, MD  CVD clinic (last OV 12/26/18)   Dr. Nevin Bloodgood Ross/Scott Jorene Key (cardiology)  REFERRING PROVIDER:  Laurey Morale, MD [place], [place] [address]  RESPONSIBLE PARTY:   Primary contact: *Daughter-[name] [phone] Email address: [email or web address] (dtr) [place]  [phone] (oldert dtr) Judeen Hammans.  ASSESSMENT and RECOMMENDATIONS:    1. Gradual cognitive and functional decline: RTC to dtr [name] [phone]), who was hoping for some guidance regarding her moms care. [name] lives across the street and has a monitor to keep an eye on the patient. She observed her mom with some episodic worrisome behaviors such as attempting to take a bath on her own,  getting up and dressed for the day at 4am,  getting her pills mixed up. [name] admits that her moms care is getting overwhelming. But [name] and her sister really would like [place] to remain [place] as long as possible. We discussed looking into local agencies to hire sitters to augment coverage. We discussed various available agencies within the area that she could tap into. We discussed the pros and cons of hiring from an agency vs private pay. Luckily, family has some money available to pay for additional help. [name] also will look into her moms insurance plan to check if any home health would be covered, though we discussed that this is probably unlikely. We discussed the probably that patient will continue a slow though progressive future cognitive decline,  and that preplanning for this eventuality would serve them well.  2. 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 Her daughters [name] and [name] live within 5 min and 15 min away. They visit the patient every morning and evening to assist with food prep, etc. Housekeeping services q month. She has a life alert necklace which she can push to activate for assistance. [place] has a Freight forwarder / Financial risk analyst who organized weekly activities for the residents which patient enjoyed, but that has been on hold d/t the COVID-19 cirsis,   3. Goals of Care: Maintain function and independence as long as possible.  4. Advanced Care Directives:  DNR in place.  5. Follow up: [facility] NP telemedicine visit Mon May 4th at 11am.  I spent 20 minutes providing this consultation. More than 50% of the time in this consultation was spent coordinating communication, interview of patient and family, review of medications, and charting.   HISTORY OF PRESENT ILLNESS:  [name] is a 85 y.o. female with medical history of mild aortic stenosis, bradycardia, carotid stenosis, chronic afib (Eliquis), chronic atrial fibrillation, chronic diastolic heart failure, COPD, diverticulosis, DJD, frequent UTIs, gout, CVA, hypothyroidism, HTN  melanoma R ear, HLD, Lumbago (chronic low back pain, osteoporosis, skin cancer (forehead), and gastric Varices Banding (2013).. This is a f/u Palliative Care telemedicine visit, requested by dtr [name],  CODE STATUS: DNR  PPS: 60% HOSPICE ELIGIBILITY/DIAGNOSIS: No, as current prognosis appears greater than 6 months.  PAST MEDICAL HISTORY:  Past Medical History:  Diagnosis Date  Aortic stenosis    a. mild on echo 10/2013   Blood clotting disorder ([facility])    Bradycardia    a. Holter (1/15):  avg HR 78, 1.4% PVCs, several pauses </= 3 sec, b.  Holter (01/2014):  AFib, freq PVCs, HR 51-127, Avg HR 88, longest pause 2.9 sec - no  changes made   Carotid stenosis    a. Carotid US (4/14):  RICA 40-59%;  b. Carotid US (01/2014) bilateral 1-39%   Chronic atrial fibrillation    a. permanent;  b. chronic coumadin;  c. 10/2013 digoxin and atenolol d/c'd 2/2 pauses of <3 sec;  d. 01/2014 digoxin resumed @ 0.125 mg in setting of difficult to control rate and diast chf.   Chronic diastolic heart failure ([place])    a. Echo (6/08):  EF 55-60%, mild MR;  b. Echo (10/2013):  EF 55%, mild AS   Contact dermatitis and other eczema, due to unspecified cause    a. h/o myalgias with atorvastatin.   Diverticulosis of colon (without mention of hemorrhage)    DJD (degenerative joint disease)    Frequent UTI    Dr. Terance Key sees pt   Gout, unspecified    H/O: CVA (cardiovascular accident) 2008   History of CVA (cerebrovascular accident)    2008   History of melanoma    on right ear   Hyperlipidemia    Hypertension    a. hx of edema with Amlodipine.   Hypothyroidism    Lumbago    chronic low back pain   Osteoporosis    las DEXA on 01/30/10   Skin cancer    forehead   Urticaria, unspecified     SOCIAL HX:  Social History   Tobacco Use   Smoking status: Former Smoker    Last attempt to quit: 10/05/1994    Years since quitting: 24.3   Smokeless tobacco: Never Used  Substance Use Topics   Alcohol use: No    Alcohol/week: 0.0 standard drinks    ALLERGIES:  Allergies  Allergen Reactions   Amlodipine     Has significant edema   Amoxicillin     Nightmares, different mental state   Hydrocodone-Acetaminophen     Nightmares, different mental state   Nitrofurantoin     unknown   Sulfonamide Derivatives Swelling    Redness      PERTINENT MEDICATIONS:  Outpatient Encounter Medications as of 01/20/2019  Medication Sig   allopurinol (ZYLOPRIM) 100 MG tablet Take 100 mg by mouth daily.   apixaban (ELIQUIS) 2.5 MG TABS tablet Take 1 tablet (2.5 mg total) by mouth 2 (two) times daily.   diltiazem  (CARDIZEM CD) 240 MG 24 hr capsule TAKE 1 CAPSULE BY MOUTH EVERY DAY   furosemide (LASIX) 20 MG tablet TAKE 1 TABLET BY MOUTH EVERY OTHER DAY   KLOR-CON M10 10 MEQ tablet TAKE 1 TABLET (10 MEQ TOTAL) BY MOUTH 2 (TWO) TIMES DAILY.   levothyroxine (SYNTHROID, LEVOTHROID) 50 MCG tablet Take 1 tablet (50 mcg total) by mouth daily.   metoprolol succinate (TOPROL-XL) 50 MG 24 hr tablet TAKE 1 TABLET BY MOUTH TWICE A DAY   mupirocin ointment (BACTROBAN) 2 % Apply 1 application topically 2 (two) times daily.   pravastatin (PRAVACHOL) 10 MG tablet TAKE ONE (1) TABLET (10 MG) BY MOUTH THREE (3) TIMES A WEEK (MONDAYS, WEDNESDAYS, AND FRIDAYS).   No facility-administered encounter medications on file as of 01/20/2019.     PHYSICAL EXAM:  Deferred; audio visit only.  Julianne Handler,  NP

## 2019-01-23 ENCOUNTER — Other Ambulatory Visit: Payer: Self-pay

## 2019-01-23 ENCOUNTER — Telehealth: Payer: Self-pay | Admitting: *Deleted

## 2019-01-23 NOTE — Telephone Encounter (Signed)
SPOKE WITH PT DTR TAMI  SHE CONSENTED TO [place]

## 2019-01-26 NOTE — Progress Notes (Signed)
Virtual Visit via Video Note   This visit type was conducted due to national recommendations for restrictions regarding the COVID-19 Pandemic (e.g. social distancing) in an effort to limit this patient's exposure and mitigate transmission in our community.  Due to her co-morbid illnesses, this patient is at least at moderate risk for complications without adequate follow up.  This format is felt to be most appropriate for this patient at this time.  All issues noted in this document were discussed and addressed.  A limited physical exam was performed with this format.  Please refer to the patient's chart for her consent to telehealth for [facility].   Evaluation Performed:  Follow-up visit  Date:  01/27/2019   ID:  Christy, Key [birth year], MRN [id number]  Patient Location: Home Provider Location: Home  PCP:  Laurey Morale, MD  Cardiologist:  Dorris Carnes, MD / [name], PA-C   Electrophysiologist:  None   Chief Complaint  Patient presents with   Follow-up    CHF, AFib   Leg Swelling     History of Present Illness:    RAEGYN RENDA is a 85 y.o. female with chronic AF, sinus node dysfunction, chronic diastolic HF. She was admitted to the hospital in 4/15 with atrial fibrillation with RVR and acute on chronic diastolic CHF. She was diuresed and digoxin was restarted for rate control. Holter done after restarting digoxin showed average HR 88 (atrial fibrillation) with occasional pauses, longest was 2.9 seconds. Repeat Holter in 03/2016 demonstrated anelevated HR up to 140, but her avg HR was ok at 89. She was last seen in 07/2018.    Today, she is seen with her 2 daughters [name] and [name]).  She has not had chest discomfort, shortness of breath, syncope, orthopnea.  She has noted increasing lower extremity swelling.  Her right is worse than the left.  She increased her Lasix from every other day to daily without much benefit.  She continues to lose weight.  She has  a lot of fatigue.  The patient does not have symptoms concerning for COVID-19 infection (fever, chills, cough, or new shortness of breath).    Past Medical History:  Diagnosis Date   Aortic stenosis    a. mild on echo 10/2013   Blood clotting disorder (HCC)    Bradycardia    a. Holter (1/15):  avg HR 78, 1.4% PVCs, several pauses </= 3 sec, b.  Holter (01/2014):  AFib, freq PVCs, HR 51-127, Avg HR 88, longest pause 2.9 sec - no changes made   Carotid stenosis    a. Carotid US (4/14):  RICA 40-59%;  b. Carotid US (01/2014) bilateral 1-39%   Chronic atrial fibrillation    a. permanent;  b. chronic coumadin;  c. 10/2013 digoxin and atenolol d/c'd 2/2 pauses of <3 sec;  d. 01/2014 digoxin resumed @ 0.125 mg in setting of difficult to control rate and diast chf.   Chronic diastolic heart failure ([place])    a. Echo (6/08):  EF 55-60%, mild MR;  b. Echo (10/2013):  EF 55%, mild AS   Contact dermatitis and other eczema, due to unspecified cause    a. h/o myalgias with atorvastatin.   Diverticulosis of colon (without mention of hemorrhage)    DJD (degenerative joint disease)    Frequent UTI    Dr. Terance Hart sees pt   Gout, unspecified    H/O: CVA (cardiovascular accident) 2008   History of CVA (cerebrovascular accident)  2008   History of melanoma    on right ear   Hyperlipidemia    Hypertension    a. hx of edema with Amlodipine.   Hypothyroidism    Lumbago    chronic low back pain   Osteoporosis    las DEXA on 01/30/10   Skin cancer    forehead   Urticaria, unspecified    Past Surgical History:  Procedure Laterality Date   ABDOMINAL HYSTERECTOMY     BAND HEMORRHOIDECTOMY  last on 10-28-11   x 2, per Dr. Deatra Ina    COLONOSCOPY  07-02-11   Dr. Deatra Ina, adenomatous polyp, no repeats are planned    FLEXIBLE SIGMOIDOSCOPY  10/28/2011   Procedure: FLEXIBLE SIGMOIDOSCOPY;  Surgeon: Inda Castle, MD;  Location: [facility];  Service: Endoscopy;  Laterality: N/A;    GASTRIC VARICES BANDING  10/28/2011   Procedure: HEMORRHOID BANDING;  Surgeon: Inda Castle, MD;  Location: [facility];  Service: Endoscopy;  Laterality: N/A;   SKIN CANCER EXCISION  2001   removed right ear per Dr. Denna Haggard, skin grafts harvested from right cheek   TONSILLECTOMY       Current Meds  Medication Sig   allopurinol (ZYLOPRIM) 100 MG tablet Take 100 mg by mouth daily.   apixaban (ELIQUIS) 2.5 MG TABS tablet Take 1 tablet (2.5 mg total) by mouth 2 (two) times daily.   diltiazem (CARDIZEM CD) 240 MG 24 hr capsule TAKE 1 CAPSULE BY MOUTH EVERY DAY   furosemide (LASIX) 20 MG tablet TAKE 1 TABLET BY MOUTH EVERY OTHER DAY   KLOR-CON M10 10 MEQ tablet TAKE 1 TABLET (10 MEQ TOTAL) BY MOUTH 2 (TWO) TIMES DAILY.   levothyroxine (SYNTHROID, LEVOTHROID) 50 MCG tablet Take 1 tablet (50 mcg total) by mouth daily.   metoprolol succinate (TOPROL-XL) 50 MG 24 hr tablet TAKE 1 TABLET BY MOUTH TWICE A DAY   pravastatin (PRAVACHOL) 10 MG tablet TAKE ONE (1) TABLET (10 MG) BY MOUTH THREE (3) TIMES A WEEK (MONDAYS, WEDNESDAYS, AND FRIDAYS).     Allergies:   Amlodipine; Amoxicillin; Hydrocodone-acetaminophen; Nitrofurantoin; and Sulfonamide derivatives   Social History   Tobacco Use   Smoking status: Former Smoker    Last attempt to quit: 10/05/1994    Years since quitting: 24.3   Smokeless tobacco: Never Used  Substance Use Topics   Alcohol use: No    Alcohol/week: 0.0 standard drinks   Drug use: No     Family Hx: The patient's family history includes Anemia in her sister and unknown relative; COPD in her unknown relative; Colon polyps in her mother; Coronary artery disease in her unknown relative; Hypertension in her mother; Immunodeficiency in her unknown relative; Stroke in her father. There is no history of Colon cancer or Heart attack.  ROS:   Please see the history of present illness.    All other systems reviewed and are negative.   Prior CV studies:   The  following studies were reviewed today:  Holter 6/17 Sustained atrial fibrillation with some PVCs, HR up to 140 but average rate 89.    Head CT 02/14/16 IMPRESSION: No acute intracranial findings. Small left frontal scalp contusion. No fracture. Old right MCA territory infarct. Lacunar infarct over the left thalamus/ internal capsule. Mild atrophic change with chronic ischemic microvascular disease.   Carotid US 4/16 Bilateral 1-39% ICA   Holter 4/15 Persistent AFib, Freq PVCs, HR 51-127, avg HR 88, longest pause 2.9 sec   Echo 1/15 EF 55%, no RWMA, probably mild  AS, mild AI, mild MR, mild LAE  Labs/Other Tests and Data Reviewed:    EKG:  No ECG reviewed.  Recent Labs: 06/24/2018: ALT 5; TSH 4.82 12/26/2018: BUN 12; Creatinine, Ser 0.86; Hemoglobin 11.7; Platelets 372; Potassium 4.5; Sodium 138   Recent Lipid Panel Lab Results  Component Value Date/Time   CHOL 146 12/17/2016 12:33 PM   TRIG 87.0 12/17/2016 12:33 PM   HDL 38.00 (L) 12/17/2016 12:33 PM   CHOLHDL 4 12/17/2016 12:33 PM   LDLCALC 91 12/17/2016 12:33 PM   LDLDIRECT 141.5 08/06/2010 09:44 AM   From K[employer] Tool    Wt Readings from Last 3 Encounters:  01/27/19 84 lb (38.1 kg)  10/17/18 89 lb 2 oz (40.4 kg)  07/19/18 85 lb (38.6 kg)     Objective:    Vital Signs:  BP 118/78    Pulse 83    Ht 5\' 1"  (1.549 m)    Wt 84 lb (38.1 kg)    SpO2 97%    BMI 15.87 kg/m    VITAL SIGNS:  reviewed GEN:  no acute distress RESPIRATORY:  normal respiratory effort, symmetric expansion CARDIOVASCULAR:  lower extremity edema noted NEURO:  alert and oriented x 3, no obvious focal deficit PSYCH:  normal affect  ASSESSMENT & PLAN:    Leg swelling She has bilateral lower extremity swelling.  Right leg is worse than the left.  I suspect that this is mainly related to venous insufficiency.  However, she does have a history of heart failure.  She has a low body weight and is likely malnourished.  However, her albumin several months  ago was normal.  I have asked her to elevate her legs and wear compression hose.  She can continue on the daily Lasix dose.  I will try to arrange a home visit to draw a CMET and CBC as well as to do an exam on her leg swelling.  I have also asked her to increase her daily consumption of Ensure to 2 cans a day to help with protein and calories.  Permanent atrial fibrillation Rate remains fairly well controlled.  She is currently on Apixaban 2.5 mg twice daily.  Continue current therapy.  Chronic diastolic CHF (congestive heart failure) ([place]) She does have lower extremity swelling.  Otherwise, she seems to be stable from a volume standpoint.  Continue current dose of furosemide.  Obtain labs as noted above.  Essential hypertension The patient's blood pressure is controlled on her current regimen.  Continue current therapy.   COVID-19 Education: The signs and symptoms of COVID-19 were discussed with the patient and how to seek care for testing (follow up with PCP or arrange E-visit).  The importance of social distancing was discussed today.  Time:   Today, I have spent 13 minutes with the patient with telehealth technology discussing the above problems.     Medication Adjustments/Labs and Tests Ordered: Current medicines are reviewed at length with the patient today.  Concerns regarding medicines are outlined above.   Tests Ordered: No orders of the defined types were placed in this encounter.   Medication Changes: No orders of the defined types were placed in this encounter.   Disposition:  Follow up in 6 month(s)  Signed, [name], PA-C  01/27/2019 10:43 AM    [place]

## 2019-01-27 ENCOUNTER — Encounter: Payer: Self-pay | Admitting: Physician Assistant

## 2019-01-27 ENCOUNTER — Telehealth (INDEPENDENT_AMBULATORY_CARE_PROVIDER_SITE_OTHER): Payer: PPO | Admitting: Physician Assistant

## 2019-01-27 ENCOUNTER — Telehealth: Payer: Self-pay | Admitting: Physician Assistant

## 2019-01-27 VITALS — BP 118/78 | HR 83 | Ht 61.0 in | Wt 84.0 lb

## 2019-01-27 DIAGNOSIS — I5032 Chronic diastolic (congestive) heart failure: Secondary | ICD-10-CM | POA: Diagnosis not present

## 2019-01-27 DIAGNOSIS — I4821 Permanent atrial fibrillation: Secondary | ICD-10-CM

## 2019-01-27 DIAGNOSIS — M7989 Other specified soft tissue disorders: Secondary | ICD-10-CM | POA: Diagnosis not present

## 2019-01-27 DIAGNOSIS — I110 Hypertensive heart disease with heart failure: Secondary | ICD-10-CM | POA: Diagnosis not present

## 2019-01-27 DIAGNOSIS — Z7189 Other specified counseling: Secondary | ICD-10-CM | POA: Diagnosis not present

## 2019-01-27 NOTE — Patient Instructions (Signed)
Medication Instructions:  No changes  If you need a refill on your cardiac medications before your next appointment, please call your pharmacy.   Lab work: We will try to arrange a home visit to draw labs (CMET, CBC)-someone will be in touch with you  If you have labs (blood work) drawn today and your tests are completely normal, you will receive your results only by: [name] MyChart Message (if you have MyChart) OR . A paper copy in the mail If you have any lab test that is abnormal or we need to change your treatment, we will call you to review the results.  Testing/Procedures: None  Follow-Up: At [facility], you and your health needs are our priority.  As part of our continuing mission to provide you with exceptional heart care, we have created designated Provider Care Teams.  These Care Teams include your primary Cardiologist (physician) and Advanced Practice Providers (APPs -  Physician Assistants and Nurse Practitioners) who all work together to provide you with the care you need, when you need it. [name], PA-C in 6 months  Any Other Special Instructions Will Be Listed Below (If Applicable).  Keep your legs elevated Try to wear compression hose (knee-high) most of the day Try to drink 2 cans of Ensure a day to help with protein and calories Call if swelling worsens or you develop shortness of breath

## 2019-01-27 NOTE — Telephone Encounter (Signed)
[place] Visit Request  Agency Requested:  [facility] Contact:  Glory Buff, NP Phone #:  [phone] Fax #:  [phone]  Patient Information: Name:  MARQUASIA SCHMIEDER  Age:  85 y.o.  DOB:  1926/02/01  MRN:  [id number]  Address:   [address] [place] [address]  Phone Numbers:   Home Phone [phone]  Mobile [phone]     Requesting Provider:   [name], PA-C   Diagnosis:   AFib, CHF  Reason for Visit:   Leg swelling - need an exam and some labs drawn; suspect venous insufficiency; I asked her to elevate and wear compression hose  Services Requested:  Physical Exam  Labs:  Comprehensive Metabolic Panel, CBC  # of Visits Needed/Frequency per Week: 1 visit  DPR on file for:  Marlane Mingle (daughter) [phone] or Adron Bene (daughter) [phone] **please contact them with information about the visit**

## 2019-01-31 ENCOUNTER — Other Ambulatory Visit: Payer: Self-pay | Admitting: Physician Assistant

## 2019-01-31 DIAGNOSIS — M7989 Other specified soft tissue disorders: Secondary | ICD-10-CM | POA: Diagnosis not present

## 2019-01-31 DIAGNOSIS — I5032 Chronic diastolic (congestive) heart failure: Secondary | ICD-10-CM | POA: Diagnosis not present

## 2019-01-31 DIAGNOSIS — I4821 Permanent atrial fibrillation: Secondary | ICD-10-CM | POA: Diagnosis not present

## 2019-02-01 ENCOUNTER — Telehealth: Payer: Self-pay | Admitting: Physician Assistant

## 2019-02-01 ENCOUNTER — Telehealth: Payer: Self-pay | Admitting: *Deleted

## 2019-02-01 ENCOUNTER — Other Ambulatory Visit: Payer: Self-pay | Admitting: Physician Assistant

## 2019-02-01 DIAGNOSIS — Z7901 Long term (current) use of anticoagulants: Secondary | ICD-10-CM

## 2019-02-01 DIAGNOSIS — D649 Anemia, unspecified: Secondary | ICD-10-CM

## 2019-02-01 LAB — CBC WITH DIFFERENTIAL/PLATELET
Basophils Absolute: 0.1 10*3/uL
Basos: 1 %
EOS (ABSOLUTE): 0.2 10*3/uL
Eos: 2 %
Hematocrit: 33.8 %
Hemoglobin: 10.8 g/dL
Immature Grans (Abs): 0 10*3/uL
Immature Granulocytes: 0 %
Lymphocytes Absolute: 2.7 10*3/uL
Lymphs: 43 %
MCH: 32.1 pg
MCHC: 32 g/dL
MCV: 101 fL
Monocytes Absolute: 0.5 10*3/uL
Monocytes: 9 %
Neutrophils Absolute: 2.9 10*3/uL
Neutrophils: 45 %
Platelets: 266 10*3/uL (ref 150–450)
RBC: 3.36 x10E6/uL
RDW: 13 %
WBC: 6.3 10*3/uL (ref 3.4–10.8)

## 2019-02-01 LAB — COMPREHENSIVE METABOLIC PANEL
ALT: 11 IU/L
AST: 18 IU/L (ref 0–40)
Albumin/Globulin Ratio: 1.2 (ref 1.2–2.2)
Albumin: 3.6 g/dL (ref 3.5–4.6)
Alkaline Phosphatase: 77 IU/L
BUN/Creatinine Ratio: 20
BUN: 12 mg/dL
Bilirubin Total: 0.3 mg/dL (ref 0.0–1.2)
CO2: 23 mmol/L (ref 20–29)
Calcium: 9 mg/dL
Chloride: 102 mmol/L (ref 96–106)
Creatinine, Ser: 0.6 mg/dL
Globulin, Total: 2.9 g/dL (ref 1.5–4.5)
Glucose: 110 mg/dL — ABNORMAL HIGH (ref 65–99)
Potassium: 4 mmol/L (ref 3.5–5.2)
Sodium: 141 mmol/L (ref 134–144)
Total Protein: 6.5 g/dL (ref 6.0–8.5)

## 2019-02-01 NOTE — Telephone Encounter (Signed)
[place] Visit Request  Agency Requested:  [facility] Contact:  Darnell Level, NP Phone #:  [phone] Fax #:  [phone]  Patient Information: Name:  [name]  DOB:  03/19/1926  MRN:  [id number]  Address:   [address] [place] [address]  Phone Numbers:   Home Phone [phone]  Mobile [phone]     Requesting Provider:   [name], PA-C   Diagnosis:   Anemia  Reason for Visit:   Repeat CBC  Services Requested:  Labs:  CBC  # of Visits Needed/Frequency per Week: Her Hgb is lower than previously and she take chronic anticoagulation.  I want to recheck her Hgb to make sure it is not continuing to drop.

## 2019-02-01 NOTE — Telephone Encounter (Signed)
Reviewed notes. Labs: Hgb 10.8, Creatinine 0.6, K 4.0, Alb 3.6, ALT 11.  PLAN:  Continue current management.  Agree she should continue to wear compression hose and keep legs elevated. Hgb is lower than the last value in 12/2018 (11.7).   Please repeat CBC in 1 month.  I will send request to [facility].  [name], PA-C    02/01/2019 5:03 PM

## 2019-02-01 NOTE — Telephone Encounter (Addendum)
   Incoming Triage Call Report from [place] Visit  Patient Name: [name] MRN: [id number] DOB: [birth year] Date of Home Visit: 02/01/2019 Requesting Provider: [place] Provider Name Giving Report: Chong Sicilian  Patient's Vital Signs:  Temp - 97.9 HR - 64 Resp -18 Height - 5'2" Weight - 90.4 pds O2 - 95% ra BP - 112/75 No alive cor reading secondary to interference  Labs Drawn Today: CMP, CBC  Clinical Assessment/Concerns:  RLE 18 cm LLE 18 cm Abd 82 cm Irregular HR abd distended No leg swelling Wearing life alert  Follow-up Recommendations:  Continue compression stocking HH will continue to monitor/follow up with pt   NOTE: This call should be routed to referring cardiology provider. If provider is out of the office and there is an urgent question, the pre-op APP should be contacted for further recommendations.

## 2019-02-01 NOTE — Progress Notes (Signed)
Label Here   [place] Page 1 of 1  Account #: [id number] Collection Date:       Req/Control #: [id number] Collection Time:       Client / Ordering Site Information: Physician Information:  Account Name: [place] Office  Address 1: [address]  Address 2:   [place], [place] Zip: [place], [place] [address]  Phone: [phone]   Ordering: Liliane Shi  Degree: PA-C  NPI: [id number]  UPIN: [id number]  Physician ID:       Patient Information:  Name: [name]  Gender: Female  Date of Birth: 04-25-1926  Age: 85 years  Address: [address], State Zip: [place],  [address]   SSN: [id number]  Patient ID: [id number]  Phone: [phone]     Alt Control#: [id number]           Comments:      Order Code Tests Ordered (Total: 1)   Order Code Tests Ordered    [phone] CBC             Diagnosis Codes:  D64.9 Z79.01        Bill Type: Third-Party   Carrier Code          Responsible Party / Guarantor Information:  Name:   Address:   [place], [place] Zip:   Phone:   Relation to Pt:   Employer Name:       ABN:    Worker's Comp:  Date of Injury:         Insurance Information:  Primary Insurance: Secondary Insurance:  Ins Co Name: [employer] PPO  Address 1: [address]  Address 2:   [place] [address]  Policy Number: [id number]  Group #: [id number]   Ins Co Name:   Address 1:   Address 2:   [place], [place] Zip:   Policy Number:   Group #:     Producer, television/film/video / Insured: Pharmacologist / Insured:  Name: CHARRON, COULTAS  Address: [place],  [address]  Pt Relation to Subscriber: Self   Name:   Address:      Pt Relation to Subscriber:       Authorization - Please Sign and Date I hereby authorize the release of medical information related to the  services described hereon and authorize payment directly to [facility].   __________________________________                    ______________ Patient Signature                                                                    Date   __________________________________                     ______________ Physician Signature  Date     BRYLEIGH, OTTAWAY  1926/07/06 [id number]  MRN: [id number]  Coll Dt/Time: MARILYN, NIHISER  [birth year] [id number]  MRN: [id number]  Coll Dt/Time: [name]  05-10-1926 [id number]  MRN: [id number]  Coll Dt/Time: MEAGHEN, VECCHIARELLI  [birth year] [id number]  MRN: [id number]  Coll Dt/Time: KYOKO, ELSEA  1926/04/30 [id number]  MRN: [id number]  Coll Dt/Time: STEFANY, STARACE  09-Jul-1926 [id number]  MRN: [id number]  Coll Dt/Time: JAEDIN, TRUMBO  [birth year] [id number]  MRN: [id number]  Coll Dt/Time: GARIMA, CHRONIS  [birth year] [id number]  MRN: [id number]  Coll Dt/Time: -

## 2019-02-06 ENCOUNTER — Encounter: Payer: Self-pay | Admitting: Internal Medicine

## 2019-02-06 ENCOUNTER — Other Ambulatory Visit: Payer: PPO | Admitting: Internal Medicine

## 2019-02-06 ENCOUNTER — Other Ambulatory Visit: Payer: Self-pay

## 2019-02-06 DIAGNOSIS — Z515 Encounter for palliative care: Secondary | ICD-10-CM

## 2019-02-06 NOTE — Progress Notes (Addendum)
May 4th, 2020 [place] Consult Note Telephone: [phone]  Fax: [phone]  *Due to the COVID-19 crisis, this visit was done via telemedicine and it was initiatedbyand consented to,the patient and her family.  PATIENT NAME:[name] DOB:1925-11-07 PTW:[id number] Home: [address] [place] Phone: [phone] or [phone]/cell  PRIMARY CARE PROVIDER:Fry, Ishmael Holter, MD CVD clinic (last OV 12/26/18)   Dr. Nevin Bloodgood Ross/Scott Jorene Minors (cardiology)  REFERRING PROVIDER:Fry, Ishmael Holter, MD [place], [place] [address]  [place] contact:*Daughter-Tami336 [phone] Email address: [email or web address] (dtr)Lee [phone] (oldert dtr) Judeen Hammans.  ASSESSMENTand RECOMMENDATIONS:  1. Gradual cognitive and functional decline: Since last visit, daughter [name] has employed a private pay care giver who lives just across the street and assists patient with meals, dressing, etc, for an hour or so twice daily. Caregiver also assists patient with a bath twice a week. [name] reports she feels much less stressed regarding her mom's care. [name] does have a monitor to keep an eye on the patient. The 2 dtrs live close by and check in frequently. Patient continues to do most of her own laundry (we discussed using a "gripper" aid, which she has available), and ambulates up and down the hallway with her cane, on a daily basis for exercise. -I discussed some exercises for seniors toward maintaining mobility and leg strength. Suggested that care giver can supervise patient in doing these exercise twice daily. I will mail out a copy of these exercises to the home. Patient has actually gained 6 lbs over the last month or so, attributed to increasing protein calorie intake. Drinking an Ensure on a daily basis.  2. Home supports: Patient lives alone in a one level Psychologist, occupational). Her daughters [name] and [name] live within 5 min and 15 min away. They visit the patient every morning and evening to assist with food prep, etc. Private pay housekeeping services q month. She has a life alert necklace which she can push to activate for assistance.The [place] facility has a Freight forwarder / Financial risk analyst who organized weekly activities for the residents which patient enjoyed, but that has been on hold d/t the COVID-19 cirsis,  3. Goals of Care:Remain in [place] apartment as long as possible.  4. Advanced Care Directives: DNR in place. We discussed some sections of the MOST form, and that it can be a good aid to guide care, and reflect patient's wishes, in the event of a medical crisis. Daughter [name] and patient are interested in further discussion. I will mail out a copy  (and accompanying patient information form).  5. Follow up: [facility] NP telemedicine visit Mon June 1st at 11am.  I spent60minutes providing this consultation, from 11am to noon.  More than 50% of the time in this consultation was spent coordinating communication, interview of patient and family, review of medications, and charting.   HISTORY OF PRESENT ILLNESS:Christy J Covingtonis a 85 y.o.femalewith medical history of mild aortic stenosis, bradycardia, carotid stenosis, chronic afib ([place]), chronic atrial fibrillation, chronic diastolic heart failure, COPD, diverticulosis, DJD, frequent UTIs, gout, CVA, hypothyroidism, HTN melanoma R ear, HLD, Lumbago (chronic low back pain, osteoporosis, skin cancer (forehead), and gastric Varices Banding (2013).. This is a f/uPalliative Care (from 01/20/2019) telemedicine visit, requested by dtr [name]  CODE STATUS:DNR  PPS:60% HOSPICE ELIGIBILITY/DIAGNOSIS:No, as current prognosis appears greater than 6 months.  PAST MEDICAL HISTORY:  Past Medical History:  Diagnosis Date  . Aortic stenosis  a. mild on echo 10/2013  . Blood clotting disorder  ([place])   . Bradycardia    a. Holter (1/15):  avg HR 78, 1.4% PVCs, several pauses </= 3 sec, b.  Holter (01/2014):  AFib, freq PVCs, HR 51-127, Avg HR 88, longest pause 2.9 sec - no changes made  . Carotid stenosis    a. Carotid US (4/14):  RICA 40-59%;  b. Carotid US (01/2014) bilateral 1-39%  . Chronic atrial fibrillation    a. permanent;  b. chronic coumadin;  c. 10/2013 digoxin and atenolol d/c'd 2/2 pauses of <3 sec;  d. 01/2014 digoxin resumed @ 0.125 mg in setting of difficult to control rate and diast chf.  . Chronic diastolic heart failure ([place])    a. Echo (6/08):  EF 55-60%, mild MR;  b. Echo (10/2013):  EF 55%, mild AS  . Contact dermatitis and other eczema, due to unspecified cause    a. h/o myalgias with atorvastatin.  . Diverticulosis of colon (without mention of hemorrhage)   . DJD (degenerative joint disease)   . Frequent UTI    Dr. Terance Hart sees pt  . Gout, unspecified   . H/O: CVA (cardiovascular accident) 2008  . History of CVA (cerebrovascular accident)    2008  . History of melanoma    on right ear  . Hyperlipidemia   . Hypertension    a. hx of edema with Amlodipine.  . Hypothyroidism   . Lumbago    chronic low back pain  . Osteoporosis    las DEXA on 01/30/10  . Skin cancer    forehead  . Urticaria, unspecified     SOCIAL HX:  Social History   Tobacco Use  . Smoking status: Former Smoker    Last attempt to quit: 10/05/1994    Years since quitting: 24.3  . Smokeless tobacco: Never Used  Substance Use Topics  . Alcohol use: No    Alcohol/week: 0.0 standard drinks    ALLERGIES:  Allergies  Allergen Reactions  . Amlodipine Swelling    Has significant edema  . Amoxicillin Other (See Comments)    Nightmares, different mental state  . Hydrocodone-Acetaminophen Other (See Comments)    Nightmares, different mental state  . Nitrofurantoin Other (See Comments)    unknown  . Sulfonamide Derivatives Other (See Comments)    Redness      PERTINENT  MEDICATIONS:  Outpatient Encounter Medications as of 02/06/2019  Medication Sig  . allopurinol (ZYLOPRIM) 100 MG tablet Take 100 mg by mouth daily.  [name] apixaban (ELIQUIS) 2.5 MG TABS tablet Take 1 tablet (2.5 mg total) by mouth 2 (two) times daily.  [name] diltiazem (CARDIZEM CD) 240 MG 24 hr capsule TAKE 1 CAPSULE BY MOUTH EVERY DAY  . furosemide (LASIX) 20 MG tablet TAKE 1 TABLET BY MOUTH EVERY OTHER DAY  . KLOR-CON M10 10 MEQ tablet TAKE 1 TABLET (10 MEQ TOTAL) BY MOUTH 2 (TWO) TIMES DAILY.  [name] levothyroxine (SYNTHROID, LEVOTHROID) 50 MCG tablet Take 1 tablet (50 mcg total) by mouth daily.  . metoprolol succinate (TOPROL-XL) 50 MG 24 hr tablet TAKE 1 TABLET BY MOUTH TWICE A DAY  . pravastatin (PRAVACHOL) 10 MG tablet TAKE ONE (1) TABLET (10 MG) BY MOUTH THREE (3) TIMES A WEEK (MONDAYS, WEDNESDAYS, AND FRIDAYS).   No facility-administered encounter medications on file as of 02/06/2019.     PHYSICAL EXAM:  Deferred; audio visit only. Patient is bright, A&O x3, and pleasantly conversant. Daughter [name] is in attendance in patient's apartment.  Julianne Handler, NP

## 2019-02-14 ENCOUNTER — Telehealth: Payer: Self-pay | Admitting: Internal Medicine

## 2019-02-14 NOTE — Telephone Encounter (Signed)
02/14/2019: Received completed MOST form via mail, form daughter Adron Bene. I uploaded this document into the [employer] [facility] EMR, and will mail form back to family.  Violeta Gelinas NP-C [phone]

## 2019-02-20 ENCOUNTER — Telehealth: Payer: Self-pay | Admitting: Physician Assistant

## 2019-02-20 DIAGNOSIS — D649 Anemia, unspecified: Secondary | ICD-10-CM

## 2019-02-20 DIAGNOSIS — Z7901 Long term (current) use of anticoagulants: Secondary | ICD-10-CM

## 2019-02-20 NOTE — Telephone Encounter (Signed)
[place] Visit Follow Up Request   Agency Requested:    [facility] Contact:  Glory Buff, NP [address] [place], [place] [address] Phone #:  [phone] Fax #:  [phone]  Patient Demographic Information: Name:  [name] Age:  85 y.o.   DOB:  1926-01-24  MRN:  [id number]   Requesting Provider:  [name], PA-C     Home visit progress note(s), lab results, telemetry strips, etc were reviewed.  Provider Recommendations: The Hgb was lower than previous on blood work on 4/28.  Please return for repeat CBC next week (week of 02/27/2019).    Follow up home services requested:  Labs:  CBC week of 5/25   All labs ordered for this home visit have been released.

## 2019-02-24 ENCOUNTER — Other Ambulatory Visit: Payer: Self-pay | Admitting: Physician Assistant

## 2019-02-28 DIAGNOSIS — Z7901 Long term (current) use of anticoagulants: Secondary | ICD-10-CM | POA: Diagnosis not present

## 2019-02-28 DIAGNOSIS — D649 Anemia, unspecified: Secondary | ICD-10-CM | POA: Diagnosis not present

## 2019-02-28 LAB — CBC
Hematocrit: 36.2 % (ref 34.0–46.6)
Hemoglobin: 12 g/dL (ref 11.1–15.9)
MCH: 32.7 pg (ref 26.6–33.0)
MCHC: 33.1 g/dL (ref 31.5–35.7)
MCV: 99 fL — ABNORMAL HIGH (ref 79–97)
Platelets: 233 10*3/uL (ref 150–450)
RBC: 3.67 x10E6/uL — ABNORMAL LOW (ref 3.77–5.28)
RDW: 13 % (ref 11.7–15.4)
WBC: 5.1 10*3/uL (ref 3.4–10.8)

## 2019-03-03 ENCOUNTER — Other Ambulatory Visit (HOSPITAL_COMMUNITY): Payer: Self-pay | Admitting: Cardiology

## 2019-03-06 ENCOUNTER — Other Ambulatory Visit: Payer: Self-pay

## 2019-03-06 ENCOUNTER — Other Ambulatory Visit: Payer: PPO | Admitting: Internal Medicine

## 2019-03-06 DIAGNOSIS — Z515 Encounter for palliative care: Secondary | ICD-10-CM

## 2019-03-06 NOTE — Progress Notes (Signed)
  10:50am:  Patient's daughter Adron Bene notified me via e-mail request to reschedule b/c she was unable to get to the patient's home this morning. She requested a tele-health visit to be reschedule to later this week.   Violeta Gelinas NP-C [phone]

## 2019-03-09 ENCOUNTER — Other Ambulatory Visit: Payer: Self-pay

## 2019-03-09 ENCOUNTER — Other Ambulatory Visit: Payer: PPO | Admitting: Internal Medicine

## 2019-03-09 ENCOUNTER — Encounter: Payer: Self-pay | Admitting: Internal Medicine

## 2019-03-09 DIAGNOSIS — Z515 Encounter for palliative care: Secondary | ICD-10-CM | POA: Diagnosis not present

## 2019-03-09 NOTE — Progress Notes (Addendum)
.    June 4th, 2020 [facility] Palliative Care Consult Note Telephone: [phone]  Fax: [phone]  *Due to the current COVID-19 infection/crises, the family prefer, and have given their verbal consent for, a provider visit via telemedicine. HIPPA policies of confidentially were discussed. Video-audio (telehealth) contact was unable to be done due technical barriers from the patient's side.  PATIENT NAME:[name] DOB:[birth year] TMY:[id number] Home: [address] [place] Phone: [phone] or [phone]/cell  PRIMARY CARE PROVIDER:Fry, Ishmael Holter, MD CVD clinic (last OV 12/26/18)   Dr. Nevin Bloodgood Ross/Scott Jorene Minors (cardiology)  REFERRING PROVIDER:Fry, Ishmael Holter, MD [place], [place] [address]  [place] contact:*Daughter-Tami336 [phone] Email address: [email or web address] ([email or web address])Lee [phone] (oldert dtr) Judeen Hammans.  ASSESSMENTand RECOMMENDATIONS: *CBC (02/27/2019) by cardiology was 12 (from 10.8)  1. Gradual cognitive and functional decline: Patient and 2 daughters mention patient has been able to be more independent over these last few weeks, to the point that she no longer is having a private care giver come in for a few hours bid. Patient has been independent in ADS, with stand by assist (by daughters). Needs some assist with taking her pills. Has been lax about taking her Synthroid b/c of having to schedule it 30 min before meals. for showers. She ambulates down the hallway to get her newspaper qd. She did receive the exercises I had mailed to her, but hasn't started. Tami does have a monitor to keep an eye on the patient. The 2 dtrs live close by and check in frequently. Patient continues to do most of her own laundry and ambulates up and down the hallway with her cane, on a daily basis for exercise. Patient has chronic low back pain that limits her activity;  relieved with rest.She takes a hydrocodone/acetaminophen 5/325mg  at bedtime r aches and pains, and she gets a good nights sleep. She has episodic diarrhea, thought r/t eating chocolate (which she loves), managed by prn imodium. She has adequate oral consumption, without signs of weight loss. -Discussed importance of taking synthroid daily; it's one of those medicines that should be taken religiously. Family is going to give this a higher priority.  -Discussed setting up a more regular exercise routine. The very best exercise she could do is walking. We set little exercise goal of standing and sitting 10 x's, 3 times a day. -Take Tylenol 500mg  bid for arthritic pain.    2. 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 Her daughters [name] and [name] live within 5 min and 15 min away. They visit the patient every morning and evening to assist with food prep, etc. Private pay housekeeping services q month. She has a life alert necklace which she can push to activate for assistance.[place] a Freight forwarder / Financial risk analyst who organized weekly activities for the residents which patient enjoyed, but that has been on hold d/t the COVID-19 cirsis,  3. Goals of Care:[place]ain in [place] apartment as long as possible. She sits out on her patio, enjoys visits from her daughter, and enjoys writing down her thoughts. -discussed writing and discussing notes to her family members; write about family and what they mean to her, reminisce about past and present good times ,   4. Advanced Care Directives:02/14/2019: Received completed MOST form via mail, form daughter Adron Bene. I uploaded this document into the [employer] EMR, and mailed form back to family.  5. Follow  up: Family requesting f/u PC NP telemedicine visit in one month (week of July 5th). Daughter will send me an e-mail with date/time that works for her.  I spent33minutes  providing this consultation, from 11am to noon. More than 50% of the time in this consultation was spent coordinating communication, interview of patient and family, review of medications, and charting.   HISTORY OF PRESENT ILLNESS:Christy J Covingtonis a 85 y.o.femalewith medical history of mild aortic stenosis, bradycardia, carotid stenosis, chronic afib (Eliquis), chronic diastolic heart failure, COPD, diverticulosis, DJD, frequent UTIs, gout, CVA, hypothyroidism, HTN melanoma R ear, HLD, Lumbago (chronic low back pain, osteoporosis, skin cancer (forehead), and gastric Varices Banding (2013). This is a f/uPalliative Care(from 02/06/2019) telemedicine visit.  CODE STATUS:DNR. MOST: DNR/DNI. Scope of medical interventions: Comfort Measures. Antibiotics: determine at time of infection. IVFs and Tube feedings: for a defined trial period.   PPS:60% HOSPICE ELIGIBILITY/DIAGNOSIS:No, as current prognosis appears greater than 6 months.  PAST MEDICAL HISTORY:  Past Medical History:  Diagnosis Date  . Aortic stenosis    a. mild on echo 10/2013  . Blood clotting disorder ([place])   . Bradycardia    a. Holter (1/15):  avg HR 78, 1.4% PVCs, several pauses </= 3 sec, b.  Holter (01/2014):  AFib, freq PVCs, HR 51-127, Avg HR 88, longest pause 2.9 sec - no changes made  . Carotid stenosis    a. Carotid US (4/14):  RICA 40-59%;  b. Carotid US (01/2014) bilateral 1-39%  . Chronic atrial fibrillation    a. permanent;  b. chronic coumadin;  c. 10/2013 digoxin and atenolol d/c'd 2/2 pauses of <3 sec;  d. 01/2014 digoxin resumed @ 0.125 mg in setting of difficult to control rate and diast chf.  . Chronic diastolic heart failure ([place])    a. Echo (6/08):  EF 55-60%, mild MR;  b. Echo (10/2013):  EF 55%, mild AS  . Contact dermatitis and other eczema, due to unspecified cause    a. h/o myalgias with atorvastatin.  . Diverticulosis of colon (without mention of hemorrhage)   . DJD (degenerative joint disease)   .  Frequent UTI    Dr. Terance Hart sees pt  . Gout, unspecified   . H/O: CVA (cardiovascular accident) 2008  . History of CVA (cerebrovascular accident)    2008  . History of melanoma    on right ear  . Hyperlipidemia   . Hypertension    a. hx of edema with Amlodipine.  . Hypothyroidism   . Lumbago    chronic low back pain  . Osteoporosis    las DEXA on 01/30/10  . Skin cancer    forehead  . Urticaria, unspecified     SOCIAL HX:  Social History   Tobacco Use  . Smoking status: Former Smoker    Last attempt to quit: 10/05/1994    Years since quitting: 24.4  . Smokeless tobacco: Never Used  Substance Use Topics  . Alcohol use: No    Alcohol/week: 0.0 standard drinks    ALLERGIES:  Allergies  Allergen Reactions  . Amlodipine Swelling    Has significant edema  . Amoxicillin Other (See Comments)    Nightmares, different mental state  . Hydrocodone-Acetaminophen Other (See Comments)    Nightmares, different mental state  . Nitrofurantoin Other (See Comments)    unknown  . Sulfonamide Derivatives Other (See Comments)    Redness      PERTINENT MEDICATIONS:  Outpatient Encounter Medications as of 03/09/2019  Medication Sig  .  allopurinol (ZYLOPRIM) 100 MG tablet Take 100 mg by mouth daily.  [name] apixaban (ELIQUIS) 2.5 MG TABS tablet Take 1 tablet (2.5 mg total) by mouth 2 (two) times daily.  [name] diltiazem (CARDIZEM CD) 240 MG 24 hr capsule TAKE 1 CAPSULE BY MOUTH EVERY DAY  . furosemide (LASIX) 20 MG tablet TAKE 1 TABLET BY MOUTH EVERY OTHER DAY  . HYDROcodone-acetaminophen (NORCO/VICODIN) 5-325 MG tablet Take 1 tablet by mouth every 4 (four) hours as needed for moderate pain.  [name] KLOR-CON M10 10 MEQ tablet TAKE 1 TABLET (10 MEQ TOTAL) BY MOUTH 2 (TWO) TIMES DAILY.  [name] levothyroxine (SYNTHROID, LEVOTHROID) 50 MCG tablet Take 1 tablet (50 mcg total) by mouth daily.  . metoprolol succinate (TOPROL-XL) 50 MG 24 hr tablet TAKE 1 TABLET BY MOUTH TWICE A DAY  . pravastatin (PRAVACHOL) 10 MG  tablet TAKE ONE (1) TABLET (10 MG) BY MOUTH THREE (3) TIMES A WEEK (MONDAYS, WEDNESDAYS, AND FRIDAYS).   No facility-administered encounter medications on file as of 03/09/2019.     PHYSICAL EXAM:  Limited d/t audio/visual nature of telehealth visit. Patient with bright affect; pleasantly conversant and without pain or distress. Her two daughters are in attendance, in patient's apartment. No dyspnea with conversation. No rashes. No obvious joint deformities or rashes on exposed skin.   Julianne Handler, NP

## 2019-03-20 ENCOUNTER — Encounter: Payer: Self-pay | Admitting: Family Medicine

## 2019-03-20 ENCOUNTER — Other Ambulatory Visit: Payer: Self-pay

## 2019-03-20 ENCOUNTER — Ambulatory Visit (INDEPENDENT_AMBULATORY_CARE_PROVIDER_SITE_OTHER): Payer: PPO | Admitting: Family Medicine

## 2019-03-20 ENCOUNTER — Telehealth: Payer: PPO | Admitting: Physician Assistant

## 2019-03-20 ENCOUNTER — Encounter: Payer: Self-pay | Admitting: Physician Assistant

## 2019-03-20 ENCOUNTER — Inpatient Hospital Stay: Admission: RE | Admit: 2019-03-20 | Payer: PPO | Source: Ambulatory Visit

## 2019-03-20 DIAGNOSIS — R410 Disorientation, unspecified: Secondary | ICD-10-CM

## 2019-03-20 MED ORDER — LEVOFLOXACIN 500 MG PO TABS: 500.0000 mg | ORAL_TABLET | Freq: Every day | ORAL | 0 refills | Status: AC

## 2019-03-20 MED ORDER — OXYCODONE-ACETAMINOPHEN 5-325 MG PO TABS: 1.0000 | ORAL_TABLET | ORAL | 0 refills | Status: DC | PRN

## 2019-03-20 NOTE — Progress Notes (Signed)
Subjective:    Patient ID: [name], female    DOB: 04-28-1926, 85 y.o.   MRN: [id number]  HPI Virtual Visit via Video Note  I connected with the patient on 03/20/19 at  2:00 PM EDT by a video enabled telemedicine application and verified that I am speaking with the correct person using two identifiers.  Location patient: home Location provider:work or home office Persons participating in the virtual visit: patient, provider  I discussed the limitations of evaluation and management by telemedicine and the availability of in person appointments. The patient expressed understanding and agreed to proceed.   HPI: Here with her daughters for 3 days of loose stools and increasing confusion. She has had several episodes where she became very confused about where she was, and this morning she forgot how to get back inside her room. Her neighbors found her wandering out in the hall. No fever. No other specific symptoms, but her daughter notes that several times in the past she had periods of confusion like this when she had a UTI.    ROS: See pertinent positives and negatives per HPI.  Past Medical History:  Diagnosis Date  . Aortic stenosis    a. mild on echo 10/2013  . Blood clotting disorder ([place])   . Bradycardia    a. Holter (1/15):  avg HR 78, 1.4% PVCs, several pauses </= 3 sec, b.  Holter (01/2014):  AFib, freq PVCs, HR 51-127, Avg HR 88, longest pause 2.9 sec - no changes made  . Carotid stenosis    a. Carotid US (4/14):  RICA 40-59%;  b. Carotid US (01/2014) bilateral 1-39%  . Chronic atrial fibrillation    a. permanent;  b. chronic coumadin;  c. 10/2013 digoxin and atenolol d/c'd 2/2 pauses of <3 sec;  d. 01/2014 digoxin resumed @ 0.125 mg in setting of difficult to control rate and diast chf.  . Chronic diastolic heart failure ([place])    a. Echo (6/08):  EF 55-60%, mild MR;  b. Echo (10/2013):  EF 55%, mild AS  . Contact dermatitis and other eczema, due to unspecified cause    a. h/o myalgias with atorvastatin.  . Diverticulosis of colon (without mention of hemorrhage)   . DJD (degenerative joint disease)   . Frequent UTI    Dr. Terance Hart sees pt  . Gout, unspecified   . H/O: CVA (cardiovascular accident) 2008  . History of CVA (cerebrovascular accident)    2008  . History of melanoma    on right ear  . Hyperlipidemia   . Hypertension    a. hx of edema with Amlodipine.  . Hypothyroidism   . Lumbago    chronic low back pain  . Osteoporosis    las DEXA on 01/30/10  . Skin cancer    forehead  . Urticaria, unspecified     Past Surgical History:  Procedure Laterality Date  . ABDOMINAL HYSTERECTOMY    . BAND HEMORRHOIDECTOMY  last on 10-28-11   x 2, per Dr. Deatra Ina   . COLONOSCOPY  07-02-11   Dr. Deatra Ina, adenomatous polyp, no repeats are planned   . FLEXIBLE SIGMOIDOSCOPY  10/28/2011   Procedure: FLEXIBLE SIGMOIDOSCOPY;  Surgeon: Inda Castle, MD;  Location: [facility];  Service: Endoscopy;  Laterality: N/A;  . GASTRIC VARICES BANDING  10/28/2011   Procedure: HEMORRHOID BANDING;  Surgeon: Inda Castle, MD;  Location: [facility];  Service: Endoscopy;  Laterality: N/A;  . SKIN CANCER EXCISION  2001   removed  right ear per Dr. Denna Haggard, skin grafts harvested from right cheek  . TONSILLECTOMY      Family History  Problem Relation Age of Onset  . Colon polyps Mother   . Hypertension Mother   . Stroke Father   . Coronary artery disease Unknown        sister  . COPD Unknown        sister  . Immunodeficiency Unknown        sister  . Anemia Unknown        sister  . Anemia Sister   . Colon cancer Neg Hx   . Heart attack Neg Hx      Current Outpatient Medications:  .  allopurinol (ZYLOPRIM) 100 MG tablet, Take 100 mg by mouth daily., Disp: , Rfl:  .  apixaban (ELIQUIS) 2.5 MG TABS tablet, Take 1 tablet (2.5 mg total) by mouth 2 (two) times daily., Disp: 180 tablet, Rfl: 1 .  diltiazem (CARDIZEM CD) 240 MG 24 hr capsule, TAKE 1 CAPSULE BY MOUTH  EVERY DAY, Disp: 90 capsule, Rfl: 2 .  furosemide (LASIX) 20 MG tablet, TAKE 1 TABLET BY MOUTH EVERY OTHER DAY, Disp: 45 tablet, Rfl: 2 .  HYDROcodone-acetaminophen (NORCO/VICODIN) 5-325 MG tablet, Take 1 tablet by mouth every 4 (four) hours as needed for moderate pain., Disp: , Rfl:  .  KLOR-CON M10 10 MEQ tablet, TAKE 1 TABLET (10 MEQ TOTAL) BY MOUTH 2 (TWO) TIMES DAILY., Disp: 180 tablet, Rfl: 2 .  levothyroxine (SYNTHROID, LEVOTHROID) 50 MCG tablet, Take 1 tablet (50 mcg total) by mouth daily., Disp: 90 tablet, Rfl: 1 .  metoprolol succinate (TOPROL-XL) 50 MG 24 hr tablet, TAKE 1 TABLET BY MOUTH TWICE A DAY, Disp: 180 tablet, Rfl: 2 .  pravastatin (PRAVACHOL) 10 MG tablet, TAKE ONE (1) TABLET (10 MG) BY MOUTH THREE (3) TIMES A WEEK (MONDAYS, WEDNESDAYS, AND FRIDAYS)., Disp: 36 tablet, Rfl: 2  EXAM:  VITALS per patient if applicable:  GENERAL: alert, oriented, appears well and in no acute distress  HEENT: atraumatic, conjunttiva clear, no obvious abnormalities on inspection of external nose and ears  NECK: normal movements of the head and neck  LUNGS: on inspection no signs of respiratory distress, breathing rate appears normal, no obvious gross SOB, gasping or wheezing  CV: no obvious cyanosis  MS: moves all visible extremities without noticeable abnormality  PSYCH/NEURO: pleasant and cooperative, no obvious depression or anxiety, speech and thought processing grossly intact  ASSESSMENT AND PLAN: Confusion and loose stools, possibly due to a UTI. Treat with Levaquin. She will drink plenty of water. Recheck prn.  Alysia Penna, MD  Discussed the following assessment and plan:  No diagnosis found.     I discussed the assessment and treatment plan with the patient. The patient was provided an opportunity to ask questions and all were answered. The patient agreed with the plan and demonstrated an understanding of the instructions.   The patient was advised to call back or seek an  in-person evaluation if the symptoms worsen or if the condition fails to improve as anticipated.     Review of Systems     Objective:   Physical Exam        Assessment & Plan:

## 2019-03-20 NOTE — Progress Notes (Signed)
Based on what you shared with me, I feel your condition warrants further evaluation and I recommend that you be seen for a face to face office visit.  Evisit created in error- pt has scheduled appt with Dr. Sharlene Motts   NOTE: If you entered your credit card information for this eVisit, you will not be charged. You may see a "hold" on your card for the $35 but that hold will drop off and you will not have a charge processed.  If you are having a true medical emergency please call 911.     For an urgent face to face visit, [facility] has five urgent care centers for your convenience:    [email or web address] to reserve your spot online an avoid wait times  [facility] [address] [place], [place] [address] Modified hours of operation: Monday-Friday, 12 PM to 6 PM  Closed Saturday & Sunday  *Across the street from [place] (New Address!) [address] [place], [place] [address] *Just off [address], across the road from [place] hours of operation: Monday-Friday, 12 PM to 6 PM  Closed Saturday & Sunday   The following sites will take your insurance:  . [facility]    [phone]                  Get Driving Directions  [address], [place] [address] . 10 am to 8 pm Monday-Friday . 12 pm to 8 pm Saturday-Sunday   . [facility] at [place]                  Get Driving Directions  [address], [place], [place] [address] . 8 am to 8 pm Monday-Friday . 9 am to 6 pm Saturday . 11 am to 6 pm Sunday   . [facility] at [place]                  Get Driving Directions   [address] [place], [place] [address] . 8 am to 8 pm Monday-Friday . 8 am to 4 pm Saturday-Sunday    . [facility] at [place]  [address], [place],  [address]  . Monday-Friday, 12 PM to 6 PM    Your e-visit answers were reviewed by a board certified advanced clinical practitioner to complete your personal care plan.  Thank you for using e-Visits.  I spent 5-10 minutes on review and completion of this note- Lacy Duverney [facility]

## 2019-03-26 ENCOUNTER — Telehealth: Payer: Self-pay | Admitting: Internal Medicine

## 2019-03-26 NOTE — Telephone Encounter (Signed)
8:50am: TC from patient's daughter [name]. Tami is monitoring patient via a audio-visual baby monitor. There is a health care aid in the patient's home, reporting the following symptoms: change in mental status including confusion. Elevated HR 120's. Patient is lethargic. She is afebrile. She was recently treated with antibiotics (Cipro) for UTI. [name] is wondering about possible causes of these symptoms. We discussed various possible etiologies, from worsening of her infection to symptoms of CHF, but that to sort all this out and treat her appropriately patient would best be served by presenting to the ER, and I advised her to do so. Tami asked if she would be able to see her mom in the hospital (d/t COVID restrictions). I shared that I wasn't sure, but I have heard that the hospital is allowing one family member to stay with their loved one.   Violeta Gelinas NP-C [phone]

## 2019-03-27 ENCOUNTER — Telehealth: Payer: Self-pay | Admitting: Family Medicine

## 2019-03-27 ENCOUNTER — Encounter: Payer: Self-pay | Admitting: Family Medicine

## 2019-03-27 NOTE — Telephone Encounter (Signed)
Pts daughter [name]) called in stating that pt heart rate was very irregular, sleeping a lot and very confused over the weekend.  Pts daughter did not want to make an appointment she would like to have a call back to let her know what she needs to do from here.

## 2019-03-27 NOTE — Telephone Encounter (Signed)
Called and spoke with pts daughter [name].  She said that she will collect the urine specimen and will call tomorrow to drop  This off.

## 2019-03-27 NOTE — Telephone Encounter (Signed)
Dr. Fry please advise. Thanks  

## 2019-03-27 NOTE — Telephone Encounter (Signed)
She has long standing atrial fibrillation so I am not surprised by the irregular heart rates. We gave her Levaquin last week to treat a presumed UTI. Can they obtain a urine sample from her and bring it by our lab to be tested?

## 2019-03-28 ENCOUNTER — Other Ambulatory Visit (INDEPENDENT_AMBULATORY_CARE_PROVIDER_SITE_OTHER): Payer: PPO

## 2019-03-28 ENCOUNTER — Other Ambulatory Visit: Payer: Self-pay

## 2019-03-28 DIAGNOSIS — R410 Disorientation, unspecified: Secondary | ICD-10-CM

## 2019-03-28 LAB — POCT URINALYSIS DIPSTICK
Bilirubin, UA: NEGATIVE
Blood, UA: NEGATIVE
Clarity, UA: NEGATIVE
Glucose, UA: NEGATIVE
Ketones, UA: NEGATIVE
Leukocytes, UA: NEGATIVE
Nitrite, UA: NEGATIVE
Protein, UA: POSITIVE — AB
Spec Grav, UA: 1.015 (ref 1.010–1.025)
Urobilinogen, UA: 0.2 E.U./dL
pH, UA: 7 (ref 5.0–8.0)

## 2019-03-29 ENCOUNTER — Other Ambulatory Visit: Payer: Self-pay

## 2019-03-29 DIAGNOSIS — I4821 Permanent atrial fibrillation: Secondary | ICD-10-CM

## 2019-03-29 DIAGNOSIS — Z5181 Encounter for therapeutic drug level monitoring: Secondary | ICD-10-CM

## 2019-04-03 ENCOUNTER — Other Ambulatory Visit: Payer: PPO

## 2019-04-05 ENCOUNTER — Encounter: Payer: Self-pay | Admitting: *Deleted

## 2019-04-18 ENCOUNTER — Telehealth: Payer: Self-pay | Admitting: Internal Medicine

## 2019-04-18 NOTE — Telephone Encounter (Signed)
04/18/2019 1:30pm.  TC to patient's daughter [name], who requested a ZOOM telehealth meeting for Thursday July 16gh at 10am. I will send a ZOOM link to her e-mail [email or web address].  Violeta Gelinas NP-C [phone]

## 2019-04-20 ENCOUNTER — Other Ambulatory Visit: Payer: Self-pay

## 2019-04-20 ENCOUNTER — Other Ambulatory Visit: Payer: PPO | Admitting: Internal Medicine

## 2019-04-20 ENCOUNTER — Encounter: Payer: Self-pay | Admitting: Internal Medicine

## 2019-04-20 DIAGNOSIS — Z515 Encounter for palliative care: Secondary | ICD-10-CM

## 2019-04-20 NOTE — Progress Notes (Signed)
July 16th, 2020 [facility] Consult Note Telephone: [phone]  Fax: [phone]   *Due to the current COVID-19 infection/crises, the family prefer, and have given their verbal consent for, a provider visit via telemedicine. HIPPA policies of confidentially were discussed. Video-audio (telehealth) contact was unable to be done due technical barriers from the patients side.   PATIENT NAME: [name] DOB: [birth year] MRN: [id number]  Home: [address] [place] Phone: [phone] or [phone]/cell   PRIMARY CARE PROVIDER:   Laurey Morale, MD  CVD clinic (last OV 12/26/18)    Dr. Nevin Bloodgood Key/[name] (cardiology)   REFERRING PROVIDER:  Laurey Morale, MD [place], [place] [address]   RESPONSIBLE PARTY:   Primary contact: *Daughter-[name] [phone] Email address: [email or web address] (dtr) [place]  [phone] (oldert dtr) Judeen Hammans.   ASSESSMENT and RECOMMENDATIONS:   1.Gradual cognitive and functional decline: Patient is A & O x 3. She is in very good spirits, and pleasantly conversant. A private care giver comes in twice a day, to assist with meals, light assist with personal care/showers, and assists with medications. Otherwise patient is at home alone with frequent checks by her daughters. Patient is independent in transfers, and with ambulation. She utilizes a walker for stability when walking outside in the hallway to get her newspaper. She is working with PT. She is continent of bowel and bladder, occasional urgency of both. Current weight is 82.8 lbs (from 70s a month ago). Family report adequate oral intake. At a height of 51 her BMI is 15.6kg/m2. Bottom redness stage one pressure injury improved with Eucerin cream. She wears TED stockings; no LE edema. Occasional explosive BMs managed with prn Imodium.   -Family requests information/dietary recommendation re: IBS. I e-mailed them  some patient information.  -discussed doing regular exercises as prescribed by PT; incorporate this into the routine during time care giver visit.     2. 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 Her daughters [name] and [name] live within 5 min and 15 min away. They visit the patient every morning and evening to assist with food prep, etc. Private pay housekeeping services q month. She has a life alert necklace which she can push to activate for assistance. [place] a Freight forwarder / Financial risk analyst who organized weekly activities for the residents which patient enjoyed, but that has been on hold d/t the COVID-19 cirsis, Current private pay care giver [name] (retired Marine scientist) comes for a few hours bid.    3. Goals of Care: Remain in [place] apartment as long as possible. She sits out on her patio, enjoys visits from her daughter, and enjoys writing down her thoughts.   4. Advanced Care Directives:  MOST form completed   5. Follow up: Family requesting f/u monthly PC NP telemedicine visits. Next visit scheduled for Thurs Aug 13th at noon.    I spent 60 minutes providing this consultation, from 11am to noon.  More than 50% of the time in this consultation was spent coordinating communication, interview of patient and family, review of medications, and charting.    HISTORY OF PRESENT ILLNESS:  [name] is a 85 y.o. female with medical history of mild aortic stenosis, bradycardia, carotid stenosis, chronic afib (Eliquis), chronic diastolic heart failure, COPD, diverticulosis, DJD, frequent UTIs, gout, CVA, hypothyroidism, HTN  melanoma R ear, HLD, Lumbago (chronic low  back pain, osteoporosis, skin cancer (forehead), and gastric Varices Banding (2013). This is a f/u Palliative Care (from 03/09/2019) telemedicine visit.   CODE STATUS: DNR. MOST: DNR/DNI. Scope of medical interventions: Comfort Measures. Antibiotics:  determine at time of infection. IVFs and Tube feedings: for a defined trial period.    PPS: 60%  HOSPICE ELIGIBILITY/DIAGNOSIS: No, as current prognosis appears greater than 6 months.  PAST MEDICAL HISTORY:  Past Medical History:  Diagnosis Date   Aortic stenosis    a. mild on echo 10/2013   Blood clotting disorder (HCC)    Bradycardia    a. Holter (1/15):  avg HR 78, 1.4% PVCs, several pauses </= 3 sec, b.  Holter (01/2014):  AFib, freq PVCs, HR 51-127, Avg HR 88, longest pause 2.9 sec - no changes made   Carotid stenosis    a. Carotid US (4/14):  RICA 40-59%;  b. Carotid US (01/2014) bilateral 1-39%   Chronic atrial fibrillation    a. permanent;  b. chronic coumadin;  c. 10/2013 digoxin and atenolol d/c'd 2/2 pauses of <3 sec;  d. 01/2014 digoxin resumed @ 0.125 mg in setting of difficult to control rate and diast chf.   Chronic diastolic heart failure ([place])    a. Echo (6/08):  EF 55-60%, mild MR;  b. Echo (10/2013):  EF 55%, mild AS   Contact dermatitis and other eczema, due to unspecified cause    a. h/o myalgias with atorvastatin.   Diverticulosis of colon (without mention of hemorrhage)    DJD (degenerative joint disease)    Frequent UTI    Dr. Terance Key sees pt   Gout, unspecified    H/O: CVA (cardiovascular accident) 2008   History of CVA (cerebrovascular accident)    2008   History of melanoma    on right ear   Hyperlipidemia    Hypertension    a. hx of edema with Amlodipine.   Hypothyroidism    Lumbago    chronic low back pain   Osteoporosis    las DEXA on 01/30/10   Skin cancer    forehead   Urticaria, unspecified     SOCIAL HX:  Social History   Tobacco Use   Smoking status: Former Smoker    Quit date: 10/05/1994    Years since quitting: 24.5   Smokeless tobacco: Never Used  Substance Use Topics   Alcohol use: No    Alcohol/week: 0.0 standard drinks    ALLERGIES:  Allergies  Allergen Reactions   Amlodipine Swelling    Has  significant edema   Amoxicillin Other (See Comments)    Nightmares, different mental state   Hydrocodone-Acetaminophen Other (See Comments)    Nightmares, different mental state   Nitrofurantoin Other (See Comments)    unknown   Sulfonamide Derivatives Other (See Comments)    Redness      PERTINENT MEDICATIONS:  Outpatient Encounter Medications as of 04/20/2019  Medication Sig   allopurinol (ZYLOPRIM) 100 MG tablet Take 100 mg by mouth daily.   apixaban (ELIQUIS) 2.5 MG TABS tablet Take 1 tablet (2.5 mg total) by mouth 2 (two) times daily.   diltiazem (CARDIZEM CD) 240 MG 24 hr capsule TAKE 1 CAPSULE BY MOUTH EVERY DAY   furosemide (LASIX) 20 MG tablet TAKE 1 TABLET BY MOUTH EVERY OTHER DAY   HYDROcodone-acetaminophen (NORCO/VICODIN) 5-325 MG tablet Take 1 tablet by mouth every 4 (four) hours as needed for moderate pain.   KLOR-CON M10 10 MEQ tablet TAKE 1 TABLET (10 MEQ TOTAL)  BY MOUTH 2 (TWO) TIMES DAILY.   levothyroxine (SYNTHROID, LEVOTHROID) 50 MCG tablet Take 1 tablet (50 mcg total) by mouth daily.   metoprolol succinate (TOPROL-XL) 50 MG 24 hr tablet TAKE 1 TABLET BY MOUTH TWICE A DAY   oxyCODONE-acetaminophen (PERCOCET) 5-325 MG tablet Take 1 tablet by mouth every 4 (four) hours as needed for severe pain.   pravastatin (PRAVACHOL) 10 MG tablet TAKE ONE (1) TABLET (10 MG) BY MOUTH THREE (3) TIMES A WEEK (MONDAYS, WEDNESDAYS, AND FRIDAYS).   No facility-administered encounter medications on file as of 04/20/2019.     PHYSICAL EXAM:   Limited d/t audio/visual nature of telehealth visit. Patient with bright affect; pleasantly conversant and without pain or distress. Her daughter and private pay care giver are in attendance, in patient's apartment. No dyspnea with conversation. No rashes. No obvious joint deformities or rashes on exposed skin.   Julianne Handler, NP

## 2019-04-27 ENCOUNTER — Other Ambulatory Visit: Payer: Self-pay

## 2019-04-27 ENCOUNTER — Other Ambulatory Visit (INDEPENDENT_AMBULATORY_CARE_PROVIDER_SITE_OTHER): Payer: PPO

## 2019-04-27 DIAGNOSIS — R300 Dysuria: Secondary | ICD-10-CM | POA: Diagnosis not present

## 2019-04-27 LAB — POCT URINALYSIS DIPSTICK
Bilirubin, UA: NEGATIVE
Blood, UA: NEGATIVE
Glucose, UA: NEGATIVE
Ketones, UA: NEGATIVE
Nitrite, UA: NEGATIVE
Protein, UA: NEGATIVE
Spec Grav, UA: 1.015 (ref 1.010–1.025)
Urobilinogen, UA: 0.2 E.U./dL
pH, UA: 6 (ref 5.0–8.0)

## 2019-05-03 ENCOUNTER — Encounter: Payer: Self-pay | Admitting: Family Medicine

## 2019-05-03 MED ORDER — OXYCODONE-ACETAMINOPHEN 5-325 MG PO TABS: 1.0000 | ORAL_TABLET | ORAL | 0 refills | Status: DC | PRN

## 2019-05-03 NOTE — Telephone Encounter (Signed)
Refilled  14 in Dr Sarajane Jews absence  Future refills per dr Sarajane Jews

## 2019-05-03 NOTE — Telephone Encounter (Signed)
Last filled 03/20/2019 Last OV 03/20/2019  Ok to fill temporary supply until Dr. Sarajane Jews returns?

## 2019-05-17 ENCOUNTER — Encounter: Payer: Self-pay | Admitting: Family Medicine

## 2019-05-18 ENCOUNTER — Other Ambulatory Visit: Payer: PPO | Admitting: Internal Medicine

## 2019-05-18 ENCOUNTER — Other Ambulatory Visit: Payer: Self-pay

## 2019-05-18 DIAGNOSIS — Z515 Encounter for palliative care: Secondary | ICD-10-CM

## 2019-05-18 NOTE — Telephone Encounter (Signed)
Last filled 05/03/2019 Last OV 03/20/2019  Ok to fill?

## 2019-05-19 NOTE — Telephone Encounter (Signed)
She will need an in person PMV with me to sign a contract and give a UDS. Please set this up

## 2019-05-20 ENCOUNTER — Encounter: Payer: Self-pay | Admitting: Internal Medicine

## 2019-05-20 NOTE — Progress Notes (Signed)
Aug 13th, 2020 [facility] Palliative [facility] Consult Note Telephone: [phone]  Fax: [phone]   *Due to the current COVID-19 infection/crises, the family prefer, and have given their verbal consent for, a provider visit via telemedicine. HIPPA policies of confidentially were discussed. Video-audio (telehealth) contact was unable to be done due technical barriers from the patient's side.   PATIENT NAME: [name] DOB: [birth year] MRN: [id number]  Home: [address] [place] Phone: [phone] or [phone]/cell   PRIMARY CARE PROVIDER:   Laurey Morale, MD  CVD clinic (last OV 12/26/18)    [name] (cardiology)   REFERRING PROVIDER:  Laurey Morale, MD [place], [place] [address]   RESPONSIBLE PARTY:   Primary contact: *Daughter-[name] [phone] Email address: [email or web address] (dtr) [name]  [phone] (oldert dtr) [name].  f/u from July 16; routine f/u -exercises -diarrhea (dietary recommendations send re: IBS -weight or functional changes.      ASSESSMENT and RECOMMENDATIONS:    1.Advance Care Planning:  A. Advanced Care Directives:  MOST form in place  B. Goals of Care: Remain in [place] apartment as long as possible. She sits out on her patio, enjoys visits from her daughter, and enjoys writing down her thoughts.    2. Cognitive and functional status: Patient is stable in cognition. Remains A & O x 3, in very good spirits, and pleasantly conversant. A private care giver continues to come in twice a day to assist with meals, light assist with personal care/showers, and assists with medications. Otherwise patient is at home alone with frequent checks by her daughters. Patient is independent in transfers, and with ambulation. She utilizes a walker for stability when walking outside in the hallway to get her newspaper. She is working with PT. She is kyphotic. She is continent of  bowel and bladder, occasional urgency of both. Current weight is 83.4 lbs (from 70's a few months ago) which is stable. Family report adequate oral intake. At a height of 5'1" her BMI is 15.6kg/m2. Bottom redness stage one pressure injury improved with Vitamin A&D ointment. She wears TED stockings; no LE edema. Chronic changes skin discoloration changes look improved. Occasional explosive BMs. We discussed management with prn Kopectate or Imodium. Following a bland diet per IBS recommendations.             -Family requests information/dietary recommendation re: IBS. I e-mailed them some patient information.             -discussed doing regular exercises as prescribed by PT; incorporate this into the routine during time care giver visit.     3. 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 Her daughters [name] and [name] live within 5 min and 15 min away. They visit the patient every morning and evening to assist with food prep, etc. Private pay housekeeping services q month. She has a life alert necklace which she can push to activate for assistance. [place] a Freight forwarder / Financial risk analyst who organized weekly activities for the residents which patient enjoyed, but that has been on hold d/t the COVID-19 crisis, Current private pay care giver [name] (retired Marine scientist) comes for a few hours bid.      4. Follow up: Family requesting f/u monthly PC NP telemedicine visits. Next visit scheduled for Thurs Sept 24th at noon.    I spent 30 minutes providing this  consultation, from noon to 12:30pm.  More than 50% of the time in this consultation was spent coordinating communication, interview of patient and family, review of medications, and charting.    HISTORY OF PRESENT ILLNESS:  [name] is a 85 y.o. female with medical history of mild aortic stenosis, bradycardia, carotid stenosis, chronic afib (Eliquis), chronic diastolic  heart failure, COPD, diverticulosis, DJD, frequent UTIs, gout, CVA, hypothyroidism, HTN  melanoma R ear, HLD, Lumbago (chronic low back pain, osteoporosis, skin cancer (forehead), and gastric Varices Banding (2013). This is a f/u Palliative Care (from 04/20/2019) telemedicine visit.   CODE STATUS: DNR. MOST: DNR/DNI. Scope of medical interventions: Comfort Measures. Antibiotics: determine at time of infection. IVFs and Tube feedings: for a defined trial period.    PPS: 60%   HOSPICE ELIGIBILITY/DIAGNOSIS: No, as current prognosis appears greater than 6 months.   PAST MEDICAL HISTORY:  Past Medical History:  Diagnosis Date  . Aortic stenosis    a. mild on echo 10/2013  . Blood clotting disorder ([place])   . Bradycardia    a. Holter (1/15):  avg HR 78, 1.4% PVCs, several pauses </= 3 sec, b.  Holter (01/2014):  AFib, freq PVCs, HR 51-127, Avg HR 88, longest pause 2.9 sec - no changes made  . Carotid stenosis    a. Carotid US (4/14):  RICA 40-59%;  b. Carotid US (01/2014) bilateral 1-39%  . Chronic atrial fibrillation    a. permanent;  b. chronic coumadin;  c. 10/2013 digoxin and atenolol d/c'd 2/2 pauses of <3 sec;  d. 01/2014 digoxin resumed @ 0.125 mg in setting of difficult to control rate and diast chf.  . Chronic diastolic heart failure ([place])    a. Echo (6/08):  EF 55-60%, mild MR;  b. Echo (10/2013):  EF 55%, mild AS  . Contact dermatitis and other eczema, due to unspecified cause    a. h/o myalgias with atorvastatin.  . Diverticulosis of colon (without mention of hemorrhage)   . DJD (degenerative joint disease)   . Frequent UTI    Dr. Terance Hart sees pt  . Gout, unspecified   . H/O: CVA (cardiovascular accident) 2008  . History of CVA (cerebrovascular accident)    2008  . History of melanoma    on right ear  . Hyperlipidemia   . Hypertension    a. hx of edema with Amlodipine.  . Hypothyroidism   . Lumbago    chronic low back pain  . Osteoporosis    las DEXA on 01/30/10  . Skin  cancer    forehead  . Urticaria, unspecified     SOCIAL HX:  Social History   Tobacco Use  . Smoking status: Former Smoker    Quit date: 10/05/1994    Years since quitting: 24.6  . Smokeless tobacco: Never Used  Substance Use Topics  . Alcohol use: No    Alcohol/week: 0.0 standard drinks    ALLERGIES:  Allergies  Allergen Reactions  . Amlodipine Swelling    Has significant edema  . Amoxicillin Other (See Comments)    Nightmares, different mental state  . Hydrocodone-Acetaminophen Other (See Comments)    Nightmares, different mental state  . Nitrofurantoin Other (See Comments)    unknown  . Sulfonamide Derivatives Other (See Comments)    Redness      PERTINENT MEDICATIONS:  Outpatient Encounter Medications as of 05/18/2019  Medication Sig  . allopurinol (ZYLOPRIM) 100 MG tablet Take 100 mg by mouth daily.  [name] apixaban (ELIQUIS) 2.5  MG TABS tablet Take 1 tablet (2.5 mg total) by mouth 2 (two) times daily.  [name] diltiazem (CARDIZEM CD) 240 MG 24 hr capsule TAKE 1 CAPSULE BY MOUTH EVERY DAY  . furosemide (LASIX) 20 MG tablet TAKE 1 TABLET BY MOUTH EVERY OTHER DAY  . HYDROcodone-acetaminophen (NORCO/VICODIN) 5-325 MG tablet Take 1 tablet by mouth every 4 (four) hours as needed for moderate pain.  [name] KLOR-CON M10 10 MEQ tablet TAKE 1 TABLET (10 MEQ TOTAL) BY MOUTH 2 (TWO) TIMES DAILY.  [name] levothyroxine (SYNTHROID, LEVOTHROID) 50 MCG tablet Take 1 tablet (50 mcg total) by mouth daily.  . metoprolol succinate (TOPROL-XL) 50 MG 24 hr tablet TAKE 1 TABLET BY MOUTH TWICE A DAY  . oxyCODONE-acetaminophen (PERCOCET) 5-325 MG tablet Take 1 tablet by mouth every 4 (four) hours as needed for severe pain.  . pravastatin (PRAVACHOL) 10 MG tablet TAKE ONE (1) TABLET (10 MG) BY MOUTH THREE (3) TIMES A WEEK (MONDAYS, WEDNESDAYS, AND FRIDAYS).   No facility-administered encounter medications on file as of 05/18/2019.     PHYSICAL EXAM:   Limited d/t audio/visual nature of telehealth visit.  Patient with bright affect; pleasantly conversant and without pain or distress. Her daughter and private pay care giver are in attendance, in patient's apartment. No dyspnea with conversation. No rashes. No obvious joint deformities or rashes on exposed skin.     Julianne Handler, NP

## 2019-05-21 ENCOUNTER — Encounter: Payer: Self-pay | Admitting: Family Medicine

## 2019-05-22 ENCOUNTER — Telehealth (INDEPENDENT_AMBULATORY_CARE_PROVIDER_SITE_OTHER): Payer: PPO | Admitting: Family Medicine

## 2019-05-22 ENCOUNTER — Encounter: Payer: Self-pay | Admitting: Family Medicine

## 2019-05-22 ENCOUNTER — Other Ambulatory Visit: Payer: Self-pay

## 2019-05-22 DIAGNOSIS — M150 Primary generalized (osteo)arthritis: Secondary | ICD-10-CM

## 2019-05-22 DIAGNOSIS — N390 Urinary tract infection, site not specified: Secondary | ICD-10-CM | POA: Diagnosis not present

## 2019-05-22 DIAGNOSIS — F1190 Opioid use, unspecified, uncomplicated: Secondary | ICD-10-CM | POA: Diagnosis not present

## 2019-05-22 DIAGNOSIS — M159 Polyosteoarthritis, unspecified: Secondary | ICD-10-CM

## 2019-05-22 MED ORDER — OXYCODONE-ACETAMINOPHEN 5-325 MG PO TABS: 2.0000 | ORAL_TABLET | Freq: Four times a day (QID) | ORAL | 0 refills | Status: AC | PRN

## 2019-05-22 MED ORDER — OXYCODONE-ACETAMINOPHEN 5-325 MG PO TABS: 2.0000 | ORAL_TABLET | Freq: Four times a day (QID) | ORAL | 0 refills | Status: DC | PRN

## 2019-05-22 MED ORDER — CEPHALEXIN 500 MG PO CAPS: ORAL_CAPSULE | ORAL | 1 refills | Status: DC

## 2019-05-22 NOTE — Progress Notes (Signed)
Virtual Visit via Telephone Note  I connected with the patient on 05/22/19 at  3:30 PM EDT by telephone and verified that I am speaking with the correct person using two identifiers. We attempted to connect virtually but we had technical difficulties with the audio and video.     I discussed the limitations, risks, security and privacy concerns of performing an evaluation and management service by telephone and the availability of in person appointments. I also discussed with the patient that there may be a patient responsible charge related to this service. The patient expressed understanding and agreed to proceed.  Location patient: home Location provider: work or home office Participants present for the call: patient, provider Patient did not have a visit in the prior 7 days to address this/these issue(s).   History of Present Illness: Here with her 2 daughters for pain management, and also because they think she has another UTI. For 2 days she has been very confused and hallucinating. This often happens when she has a UTI. No fever. She drinks plenty of water daily. Her pain has been well controlled.  Indication for chronic opioid: osteoarthritis Medication and dose: Percocet 5-325 # pills per month: 240 Last UDS date: 06-24-18 Opioid Treatment Agreement signed (Y/N): 06-24-18 Opioid Treatment Agreement last reviewed with patient:  05-22-19 [facility] reviewed this encounter (include red flags):  05-22-19    Observations/Objective: Patient sounds cheerful and well on the phone. I do not appreciate any SOB. Speech and thought processing are grossly intact. Patient reported vitals:  Assessment and Plan: Pain management, meds were refilled. For the acute UTI we will treat with Keflex for TID for 7 days. Then we will switch to maintenance by taking one Keflex tablet every day. Recheck prn. Alysia Penna, MD   Follow Up Instructions:     [phone] 5-10 [phone] 11-20 [id number] 21-30 I did not  refer this patient for an OV in the next 24 hours for this/these issue(s).  I discussed the assessment and treatment plan with the patient. The patient was provided an opportunity to ask questions and all were answered. The patient agreed with the plan and demonstrated an understanding of the instructions.   The patient was advised to call back or seek an in-person evaluation if the symptoms worsen or if the condition fails to improve as anticipated.  I provided 17 minutes of non-face-to-face time during this encounter.   Alysia Penna, MD

## 2019-05-22 NOTE — Telephone Encounter (Signed)
Patient is being seen today. Will send to Dr. Sarajane Jews as Juluis Rainier.

## 2019-06-15 ENCOUNTER — Encounter: Payer: Self-pay | Admitting: Family Medicine

## 2019-06-16 ENCOUNTER — Ambulatory Visit (INDEPENDENT_AMBULATORY_CARE_PROVIDER_SITE_OTHER): Payer: PPO | Admitting: Family Medicine

## 2019-06-16 ENCOUNTER — Other Ambulatory Visit: Payer: Self-pay

## 2019-06-16 ENCOUNTER — Encounter: Payer: Self-pay | Admitting: Family Medicine

## 2019-06-16 DIAGNOSIS — L89151 Pressure ulcer of sacral region, stage 1: Secondary | ICD-10-CM | POA: Diagnosis not present

## 2019-06-16 NOTE — Progress Notes (Signed)
   Subjective:    Patient ID: [name], female    DOB: 01/29/1926, 85 y.o.   MRN: [id number]  HPI Here for a sacral decubitus ulcer. This appeared several weeks ago. It is not painful to her. Her daughter has been dressing this with Neosporin with mixed results. She sleeps on her side, and while sitting up she has been sitting on a special pillow. A photo of this area is in her My Chart.   Review of Systems  Constitutional: Negative.   Respiratory: Negative.   Cardiovascular: Negative.   Skin: Positive for wound.       Objective:   Physical Exam Constitutional:      Appearance: Normal appearance.  Skin:    Comments: From the photo this appears to be a stage 1 decubitus   Neurological:     Mental Status: She is alert.           Assessment & Plan:  Sacral decubitus. Her daughter has purchased some Tega Derm patches, and they will apply these daily. Recheck in 2 weeks if not better. Alysia Penna, MD

## 2019-06-16 NOTE — Telephone Encounter (Signed)
This is called a sacral decubitus ulcer. There are several OTC products that are helpful to heal this up. I suggest either Tegaderm Hydrogel Wound Filler or Convatec Duoderm Hydroactive paste. If she cannot find these, ask her pharmacist for assistance

## 2019-06-26 DIAGNOSIS — H524 Presbyopia: Secondary | ICD-10-CM | POA: Diagnosis not present

## 2019-06-26 DIAGNOSIS — H35033 Hypertensive retinopathy, bilateral: Secondary | ICD-10-CM | POA: Diagnosis not present

## 2019-06-29 ENCOUNTER — Other Ambulatory Visit: Payer: PPO | Admitting: Internal Medicine

## 2019-06-30 ENCOUNTER — Encounter: Payer: Self-pay | Admitting: Internal Medicine

## 2019-06-30 ENCOUNTER — Other Ambulatory Visit: Payer: PPO | Admitting: Internal Medicine

## 2019-06-30 ENCOUNTER — Other Ambulatory Visit: Payer: Self-pay

## 2019-06-30 DIAGNOSIS — Z515 Encounter for palliative care: Secondary | ICD-10-CM | POA: Diagnosis not present

## 2019-06-30 NOTE — Progress Notes (Signed)
Sept 25th, 2020 [facility] Palliative Care Consult Note Telephone: [phone]  Fax: [phone]   *Due to the current COVID-19 infection/crises, the family prefer, and have given their verbal consent for, a provider visit via telemedicine. HIPPA policies of confidentially were discussed.    PATIENT NAME: [name] DOB: 1926/03/29 MRN: [id number]  Home: [address] [place] Phone: [phone] or [phone]/cell   PRIMARY CARE PROVIDER:   Laurey Morale, MD  CVD clinic (last OV 12/26/18)    Dr. Nevin Bloodgood Key/[name] (cardiology)   REFERRING PROVIDER:  Laurey Morale, MD [place],  [address]   RESPONSIBLE PARTY:   Primary contact: *Daughter-[name] [phone] Email address: [name] [email or web address]. (dtr) [place]  [address] [phone] (oldert dtr) [name].     ASSESSMENT and RECOMMENDATIONS:     1.Advance Care Planning:             A. Advanced Care Directives:  MOST form in place   B. Goals of Care: Remain in [place] apartment as long as possible. She sits out on her patio, enjoys visits from her daughter, and enjoys her paid caregiver.     2. Cognitive and functional status: Continues A & O x 3, in very good spirits, and pleasantly conversant. Daughter noticed some occasional confusion; easily reoriented.Patient is independent in transfers, and with ambulation. She utilizes a walker for stability when walking outside in the hallway to get her newspaper. She exercises daily and folds the laundry. She is kyphotic. She is continent of bowel and bladder, occasional urgency of both. Current weight is 85.6 which is up 2.2 lbs over the last 6 weeks. 83.4 lbs. At a height of 51 her BMI is 16.2 kg/m2. She had progression in her buttock pressure injuries now size of 50 cent piece one side, 25 cent piece on the other. Family states overall improvement with use of Preparation H wipes and Tegaderm. She wears TED  stockings; no LE edema but chronic changes skin discoloration. No further diarrhea.   3. 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 Her daughters [name] and [name] live within 5 min and 15 min away. A private care giver [name]) comes in three times a day to assist with meals, light assist with personal care/showers, and assists with medications. Otherwise patient is at home alone with frequent checks by her daughters.    4. Follow up: Family requesting f/u monthly PC NP telemedicine visits. Next visit scheduled for Thurs November 5th at 4pm.   I spent 30 minutes providing this consultation, from 3 to 3:30pm.  More than 50% of the time in this consultation was spent coordinating communication, interview of patient and family, review of medications, and charting.    HISTORY OF PRESENT ILLNESS:  [name] is a 85 y.o. female with medical history of mild aortic stenosis, bradycardia, carotid stenosis, chronic afib (Eliquis), chronic diastolic heart failure, COPD, diverticulosis, DJD, frequent UTIs, gout, CVA, hypothyroidism, HTN  melanoma R ear, HLD, Lumbago (chronic low back pain, osteoporosis, skin cancer (forehead), and gastric Varices Banding (2013). This is a f/u Palliative Care (from 05/18/2019) telemedicine visit.   CODE STATUS: DNR. MOST: DNR/DNI. Scope of medical interventions: Comfort Measures. Antibiotics: determine at time of infection. IVFs and Tube feedings: for a defined trial period.    PPS: 60%   HOSPICE ELIGIBILITY/DIAGNOSIS: No, as current prognosis appears greater than 6 months.  PAST MEDICAL HISTORY:  Past Medical History:  Diagnosis Date   Aortic stenosis    a. mild on echo 10/2013   Blood clotting disorder (HCC)    Bradycardia    a. Holter (1/15):  avg HR 78, 1.4% PVCs, several pauses </= 3 sec, b.  Holter (01/2014):  AFib, freq PVCs, HR 51-127, Avg HR 88, longest pause 2.9 sec - no changes made     Carotid stenosis    a. Carotid US (4/14):  RICA 40-59%;  b. Carotid US (01/2014) bilateral 1-39%   Chronic atrial fibrillation    a. permanent;  b. chronic coumadin;  c. 10/2013 digoxin and atenolol d/c'd 2/2 pauses of <3 sec;  d. 01/2014 digoxin resumed @ 0.125 mg in setting of difficult to control rate and diast chf.   Chronic diastolic heart failure ([place])    a. Echo (6/08):  EF 55-60%, mild MR;  b. Echo (10/2013):  EF 55%, mild AS   Contact dermatitis and other eczema, due to unspecified cause    a. h/o myalgias with atorvastatin.   Diverticulosis of colon (without mention of hemorrhage)    DJD (degenerative joint disease)    Frequent UTI    Dr. Terance Key sees pt   Gout, unspecified    H/O: CVA (cardiovascular accident) 2008   History of CVA (cerebrovascular accident)    2008   History of melanoma    on right ear   Hyperlipidemia    Hypertension    a. hx of edema with Amlodipine.   Hypothyroidism    Lumbago    chronic low back pain   Osteoporosis    las DEXA on 01/30/10   Skin cancer    forehead   Urticaria, unspecified     SOCIAL HX:  Social History   Tobacco Use   Smoking status: Former Smoker    Quit date: 10/05/1994    Years since quitting: 24.7   Smokeless tobacco: Never Used  Substance Use Topics   Alcohol use: No    Alcohol/week: 0.0 standard drinks    ALLERGIES:  Allergies  Allergen Reactions   Amlodipine Swelling    Has significant edema   Amoxicillin Other (See Comments)    Nightmares, different mental state   Hydrocodone-Acetaminophen Other (See Comments)    Nightmares, different mental state   Nitrofurantoin Other (See Comments)    unknown   Sulfonamide Derivatives Other (See Comments)    Redness      PERTINENT MEDICATIONS:  Outpatient Encounter Medications as of 06/30/2019  Medication Sig   allopurinol (ZYLOPRIM) 100 MG tablet Take 100 mg by mouth daily.   apixaban (ELIQUIS) 2.5 MG TABS tablet Take 1 tablet (2.5 mg  total) by mouth 2 (two) times daily.   cephALEXin (KEFLEX) 500 MG capsule Take one capsule three times daily for 7 days, then take one a day   diltiazem (CARDIZEM CD) 240 MG 24 hr capsule TAKE 1 CAPSULE BY MOUTH EVERY DAY   furosemide (LASIX) 20 MG tablet TAKE 1 TABLET BY MOUTH EVERY OTHER DAY   KLOR-CON M10 10 MEQ tablet TAKE 1 TABLET (10 MEQ TOTAL) BY MOUTH 2 (TWO) TIMES DAILY.   levothyroxine (SYNTHROID, LEVOTHROID) 50 MCG tablet Take 1 tablet (50 mcg total) by mouth daily.   metoprolol succinate (TOPROL-XL) 50 MG 24 hr tablet TAKE 1 TABLET BY MOUTH TWICE A DAY   [START ON 07/22/2019] oxyCODONE-acetaminophen (PERCOCET) 5-325 MG tablet Take 2 tablets by mouth every 6 (six) hours as needed for severe pain.  pravastatin (PRAVACHOL) 10 MG tablet TAKE ONE (1) TABLET (10 MG) BY MOUTH THREE (3) TIMES A WEEK (MONDAYS, WEDNESDAYS, AND FRIDAYS).   No facility-administered encounter medications on file as of 06/30/2019.     PHYSICAL EXAM:   Limited d/t audio/visual nature of telehealth visit. Patient with bright affect; pleasantly conversant and without pain or distress. Her daughter and private pay care giver are in attendance, in patient's apartment. No dyspnea with conversation. No rashes. No obvious joint deformities or rashes on exposed skin.   Julianne Handler, NP

## 2019-07-06 ENCOUNTER — Other Ambulatory Visit: Payer: Self-pay | Admitting: Internal Medicine

## 2019-07-06 ENCOUNTER — Other Ambulatory Visit: Payer: Self-pay | Admitting: Physician Assistant

## 2019-07-10 ENCOUNTER — Other Ambulatory Visit: Payer: Self-pay | Admitting: Cardiology

## 2019-07-12 ENCOUNTER — Other Ambulatory Visit: Payer: Self-pay | Admitting: Physician Assistant

## 2019-07-12 DIAGNOSIS — I10 Essential (primary) hypertension: Secondary | ICD-10-CM

## 2019-07-12 DIAGNOSIS — I482 Chronic atrial fibrillation, unspecified: Secondary | ICD-10-CM

## 2019-07-18 ENCOUNTER — Encounter: Payer: Self-pay | Admitting: Family Medicine

## 2019-07-18 MED ORDER — CEPHALEXIN 500 MG PO CAPS: 500.0000 mg | ORAL_CAPSULE | Freq: Every day | ORAL | 3 refills | Status: DC

## 2019-07-18 MED ORDER — LEVOTHYROXINE SODIUM 50 MCG PO TABS: 50.0000 ug | ORAL_TABLET | Freq: Every day | ORAL | 3 refills | Status: DC

## 2019-07-18 NOTE — Telephone Encounter (Signed)
Both were refilled for one year

## 2019-07-20 NOTE — Telephone Encounter (Signed)
Noted! Tried to call pt no answer. 

## 2019-07-21 NOTE — Progress Notes (Signed)
Flu shot given on behalf of [facility] to patient at her home w/her daughter present.  Consent signed. Lot # [id number] Exp. 04/03/20 [place] [id number] Mfg [employer]: Influenza Vaccine Fluarix Quadrivalent To RT deltoid by the undersigned.

## 2019-08-03 ENCOUNTER — Encounter: Payer: Self-pay | Admitting: Family Medicine

## 2019-08-03 ENCOUNTER — Telehealth: Payer: Self-pay

## 2019-08-03 NOTE — Telephone Encounter (Signed)
Copied from [place] [phone] [phone]. Topic: General - Other >> Aug 03, 2019 11:45 AM Rainey Pines A wrote: Patient daughter would like a callback on [phone] in regards to uploaded my chart photo and message. Please advise

## 2019-08-04 NOTE — Telephone Encounter (Signed)
I would swithc to just Neosporin at this point

## 2019-08-04 NOTE — Telephone Encounter (Signed)
Tami notified of Dr. Sarajane Jews advise.

## 2019-08-14 DIAGNOSIS — I5032 Chronic diastolic (congestive) heart failure: Secondary | ICD-10-CM | POA: Diagnosis not present

## 2019-08-14 DIAGNOSIS — M810 Age-related osteoporosis without current pathological fracture: Secondary | ICD-10-CM | POA: Diagnosis not present

## 2019-08-14 DIAGNOSIS — I110 Hypertensive heart disease with heart failure: Secondary | ICD-10-CM | POA: Diagnosis not present

## 2019-08-14 DIAGNOSIS — Z4800 Encounter for change or removal of nonsurgical wound dressing: Secondary | ICD-10-CM | POA: Diagnosis not present

## 2019-08-14 DIAGNOSIS — J449 Chronic obstructive pulmonary disease, unspecified: Secondary | ICD-10-CM | POA: Diagnosis not present

## 2019-08-14 DIAGNOSIS — M545 Low back pain: Secondary | ICD-10-CM | POA: Diagnosis not present

## 2019-08-14 DIAGNOSIS — I4821 Permanent atrial fibrillation: Secondary | ICD-10-CM | POA: Diagnosis not present

## 2019-08-14 DIAGNOSIS — I350 Nonrheumatic aortic (valve) stenosis: Secondary | ICD-10-CM | POA: Diagnosis not present

## 2019-08-14 DIAGNOSIS — M1990 Unspecified osteoarthritis, unspecified site: Secondary | ICD-10-CM | POA: Diagnosis not present

## 2019-08-14 DIAGNOSIS — G8929 Other chronic pain: Secondary | ICD-10-CM | POA: Diagnosis not present

## 2019-08-14 DIAGNOSIS — L89312 Pressure ulcer of right buttock, stage 2: Secondary | ICD-10-CM | POA: Diagnosis not present

## 2019-08-14 DIAGNOSIS — M109 Gout, unspecified: Secondary | ICD-10-CM | POA: Diagnosis not present

## 2019-08-14 DIAGNOSIS — R001 Bradycardia, unspecified: Secondary | ICD-10-CM | POA: Diagnosis not present

## 2019-08-15 ENCOUNTER — Telehealth: Payer: Self-pay | Admitting: *Deleted

## 2019-08-15 NOTE — Telephone Encounter (Signed)
Copied from [place] #[id number]. Topic: General - Other >> Aug 15, 2019  8:35 AM Rainey Pines A wrote: Sharyn Lull from [facility]  nurse called for verbals for skilled nursing 1w4 wound care . Best contact [phone] secure vm

## 2019-08-16 NOTE — Telephone Encounter (Signed)
Please okay the orders  

## 2019-08-16 NOTE — Telephone Encounter (Signed)
Okay for verbal orders? Please advise 

## 2019-08-17 ENCOUNTER — Other Ambulatory Visit: Payer: PPO | Admitting: Internal Medicine

## 2019-08-17 ENCOUNTER — Encounter: Payer: Self-pay | Admitting: Internal Medicine

## 2019-08-17 DIAGNOSIS — Z515 Encounter for palliative care: Secondary | ICD-10-CM

## 2019-08-17 DIAGNOSIS — M545 Low back pain: Secondary | ICD-10-CM | POA: Diagnosis not present

## 2019-08-17 DIAGNOSIS — G8929 Other chronic pain: Secondary | ICD-10-CM

## 2019-08-17 NOTE — Telephone Encounter (Signed)
Left message to return phone call.

## 2019-08-17 NOTE — Progress Notes (Signed)
Nov 12th, 2020 [facility] Palliative Care Consult Note Telephone: [phone]  Fax: [phone]   *Due to the current COVID-19 infection/crises, the family prefer, and have given their verbal consent for, a provider visit via telemedicine. HIPPA policies of confidentially were discussed.    PATIENT NAME: [name] DOB: 1926/05/07 MRN: [id number]  [address] [place] [phone], [phone] Pharmacy: [facility] [phone]   PRIMARY CARE PROVIDER:    Laurey Morale, MD  CVD clinic    Dr. Nevin Bloodgood Ross/Scott Jorene Minors (cardiology) [facility];  checks 2 x month. Clenton Pare [id number]   REFERRING PROVIDER:  Laurey Morale, MD [place], [place] [address]   RESPONSIBLE PARTY:   Primary contact: *Daughter-Tami [phone] Email address: Adron Bene [email or web address] (dtr) [place]  [phone] (oldert dtr) Judeen Hammans.     ASSESSMENT and RECOMMENDATIONS:     1.Advance Care Planning:             A. Advanced Care Directives:  MOST form in place   B. Goals of Care: Remain in [place] apartment as long as possible. She sits out on her patio, enjoys visits from her daughter, and enjoys her paid caregiver.    2. Cognitive and functional status: Continues A & O x 3, with a sweet demeanor. Family reports and patient admits to, episodic confusion (seems to occur in the morning); prepares to leave for imagined trips with her first husband, or the belief she needs to move from her current apartment of 16 years. Quickly and easily reoriented. She reminisces about losing her drivers license and about her older sister Meryl Crutch having to move to a nursing home. But patient is not sad or depressed. Patient continues independent in transfers and ambulation. Walker for stability. Exercises daily and folds the laundry. Severely kyphotic. Continent of bowel and bladder. Bottom pressure injuries  are improved, with suggestions from the wound nurse (wound wash/barrier cream). Left side has resolved, and right is smaller and continuing to improve. She takes one Percocet qhs for back pain. Moving bowels okay with daily laxative. State diminished appetite but takes supplemental nutritional drinks 2/day, with frequent snacks. Her weight is stable at 86 lbs. At a height of 5'1" her BMI is 16.2 kg/m2.   -We discussed how opioids can contribute to confusion. That we could trial discontinuation of bedtime dose of Percocet, substituting 1000mg  Tylenol at hs and see if this improves her morning confusion yet adequate nocturnal pain relief.  -We discussed use of appetite stimulant (like Remeron), but Otila Kluver would like to consult a family member who is also a pharmacist. She'll call me if she wants to consider starting this.   3. 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 There's an Financial risk analyst that checks in on her q 2 weeks. Her daughters [name] and [name] live within 5 min and 15 min away. A private care giver Benjamine Mola) comes in three times a day to assist with meals, light assist with personal care/showers, and assists with medications. Otherwise patient is at home alone with frequent checks by her daughters. She is followed by the CV clinic for home visits.   4. Follow up: Family requesting f/u monthly PC NP telemedicine visits. Next visit scheduled for Tues 10/31/2019 @ 3pm. I spent 30 minutes providing this consultation, from 3 to 3:30pm.  More than 50% of the time in  this consultation was spent coordinating communication, interview of patient and family, review of medications, and charting.    HISTORY OF PRESENT ILLNESS:  [name] is a 85 y.o. female with medical history of mild aortic stenosis, bradycardia, carotid stenosis, chronic afib (Eliquis), chronic diastolic heart failure, COPD, diverticulosis, DJD, frequent UTIs, gout, CVA,  hypothyroidism, HTN  melanoma R ear, HLD, Lumbago (chronic low back pain, osteoporosis, skin cancer (forehead), and gastric Varices Banding (2013). This is a f/u Palliative Care (from 06/30/2019) telemedicine visit.   CODE STATUS: DNR. MOST: DNR/DNI. Scope of medical interventions: Comfort Measures. Antibiotics: determine at time of infection. IVFs and Tube feedings: for a defined trial period.    PPS: 60%   HOSPICE ELIGIBILITY/DIAGNOSIS: No, as current prognosis appears greater than 6 months.   PAST MEDICAL HISTORY:  Past Medical History:  Diagnosis Date  . Aortic stenosis    a. mild on echo 10/2013  . Blood clotting disorder ([place])   . Bradycardia    a. Holter (1/15):  avg HR 78, 1.4% PVCs, several pauses </= 3 sec, b.  Holter (01/2014):  AFib, freq PVCs, HR 51-127, Avg HR 88, longest pause 2.9 sec - no changes made  . Carotid stenosis    a. Carotid US (4/14):  RICA 40-59%;  b. Carotid US (01/2014) bilateral 1-39%  . Chronic atrial fibrillation    a. permanent;  b. chronic coumadin;  c. 10/2013 digoxin and atenolol d/c'd 2/2 pauses of <3 sec;  d. 01/2014 digoxin resumed @ 0.125 mg in setting of difficult to control rate and diast chf.  . Chronic diastolic heart failure ([place])    a. Echo (6/08):  EF 55-60%, mild MR;  b. Echo (10/2013):  EF 55%, mild AS  . Contact dermatitis and other eczema, due to unspecified cause    a. h/o myalgias with atorvastatin.  . Diverticulosis of colon (without mention of hemorrhage)   . DJD (degenerative joint disease)   . Frequent UTI    Dr. Terance Hart sees pt  . Gout, unspecified   . H/O: CVA (cardiovascular accident) 2008  . History of CVA (cerebrovascular accident)    2008  . History of melanoma    on right ear  . Hyperlipidemia   . Hypertension    a. hx of edema with Amlodipine.  . Hypothyroidism   . Lumbago    chronic low back pain  . Osteoporosis    las DEXA on 01/30/10  . Skin cancer    forehead  . Urticaria, unspecified     SOCIAL HX:  Social  History   Tobacco Use  . Smoking status: Former Smoker    Quit date: 10/05/1994    Years since quitting: 24.8  . Smokeless tobacco: Never Used  Substance Use Topics  . Alcohol use: No    Alcohol/week: 0.0 standard drinks    ALLERGIES:  Allergies  Allergen Reactions  . Amlodipine Swelling    Has significant edema  . Amoxicillin Other (See Comments)    Nightmares, different mental state  . Hydrocodone-Acetaminophen Other (See Comments)    Nightmares, different mental state  . Nitrofurantoin Other (See Comments)    unknown  . Sulfonamide Derivatives Other (See Comments)    Redness      PERTINENT MEDICATIONS:  Outpatient Encounter Medications as of 08/17/2019  Medication Sig  . allopurinol (ZYLOPRIM) 100 MG tablet Take 100 mg by mouth daily.  . cephALEXin (KEFLEX) 500 MG capsule Take 1 capsule (500 mg total) by mouth daily.  [name]  diltiazem (CARDIZEM CD) 240 MG 24 hr capsule TAKE 1 CAPSULE BY MOUTH EVERY DAY  . ELIQUIS 2.5 MG TABS tablet TAKE 1 TABLET BY MOUTH TWICE A DAY  . furosemide (LASIX) 20 MG tablet TAKE 1 TABLET BY MOUTH EVERY OTHER DAY  . KLOR-CON M10 10 MEQ tablet TAKE 1 TABLET (10 MEQ TOTAL) BY MOUTH 2 (TWO) TIMES DAILY.  [name] levothyroxine (SYNTHROID) 50 MCG tablet Take 1 tablet (50 mcg total) by mouth daily.  . metoprolol succinate (TOPROL-XL) 50 MG 24 hr tablet TAKE 1 TABLET BY MOUTH TWICE A DAY  . oxyCODONE-acetaminophen (PERCOCET) 5-325 MG tablet Take 2 tablets by mouth every 6 (six) hours as needed for severe pain.  . pravastatin (PRAVACHOL) 10 MG tablet TAKE 1 TABLET BY MOUTH 3 TIMES A WEEK, MONDAYS, WEDNESDAYS, AND FRIDAYS   No facility-administered encounter medications on file as of 08/17/2019.     PHYSICAL EXAM:   Limited d/t audio/visual nature of telehealth visit. Patient with bright affect; pleasantly conversant and without pain or distress. Her daughter and private pay care giver are in attendance, in patient's apartment. No dyspnea with conversation. No  rashes. No obvious joint deformities or rashes on exposed skin.   Julianne Handler, NP

## 2019-08-18 ENCOUNTER — Other Ambulatory Visit: Payer: Self-pay

## 2019-08-18 NOTE — Telephone Encounter (Signed)
Left message to return phone call.

## 2019-08-21 DIAGNOSIS — G8929 Other chronic pain: Secondary | ICD-10-CM | POA: Diagnosis not present

## 2019-08-21 DIAGNOSIS — I110 Hypertensive heart disease with heart failure: Secondary | ICD-10-CM | POA: Diagnosis not present

## 2019-08-21 DIAGNOSIS — M1990 Unspecified osteoarthritis, unspecified site: Secondary | ICD-10-CM | POA: Diagnosis not present

## 2019-08-21 DIAGNOSIS — L89312 Pressure ulcer of right buttock, stage 2: Secondary | ICD-10-CM | POA: Diagnosis not present

## 2019-08-21 DIAGNOSIS — M109 Gout, unspecified: Secondary | ICD-10-CM | POA: Diagnosis not present

## 2019-08-21 DIAGNOSIS — I4821 Permanent atrial fibrillation: Secondary | ICD-10-CM | POA: Diagnosis not present

## 2019-08-21 DIAGNOSIS — R001 Bradycardia, unspecified: Secondary | ICD-10-CM | POA: Diagnosis not present

## 2019-08-21 DIAGNOSIS — I5032 Chronic diastolic (congestive) heart failure: Secondary | ICD-10-CM | POA: Diagnosis not present

## 2019-08-21 DIAGNOSIS — M810 Age-related osteoporosis without current pathological fracture: Secondary | ICD-10-CM | POA: Diagnosis not present

## 2019-08-21 DIAGNOSIS — Z4800 Encounter for change or removal of nonsurgical wound dressing: Secondary | ICD-10-CM | POA: Diagnosis not present

## 2019-08-21 DIAGNOSIS — I350 Nonrheumatic aortic (valve) stenosis: Secondary | ICD-10-CM | POA: Diagnosis not present

## 2019-08-21 DIAGNOSIS — J449 Chronic obstructive pulmonary disease, unspecified: Secondary | ICD-10-CM | POA: Diagnosis not present

## 2019-08-21 DIAGNOSIS — M545 Low back pain: Secondary | ICD-10-CM | POA: Diagnosis not present

## 2019-08-21 NOTE — Telephone Encounter (Signed)
Verbal orders given to Michelle. 

## 2019-08-21 NOTE — Telephone Encounter (Signed)
3rd attempt with no answer or call back.

## 2019-08-29 ENCOUNTER — Other Ambulatory Visit: Payer: Self-pay

## 2019-09-06 DIAGNOSIS — M1990 Unspecified osteoarthritis, unspecified site: Secondary | ICD-10-CM | POA: Diagnosis not present

## 2019-09-06 DIAGNOSIS — L89312 Pressure ulcer of right buttock, stage 2: Secondary | ICD-10-CM | POA: Diagnosis not present

## 2019-09-06 DIAGNOSIS — Z4800 Encounter for change or removal of nonsurgical wound dressing: Secondary | ICD-10-CM | POA: Diagnosis not present

## 2019-09-06 DIAGNOSIS — I110 Hypertensive heart disease with heart failure: Secondary | ICD-10-CM | POA: Diagnosis not present

## 2019-09-06 DIAGNOSIS — M545 Low back pain: Secondary | ICD-10-CM | POA: Diagnosis not present

## 2019-09-06 DIAGNOSIS — J449 Chronic obstructive pulmonary disease, unspecified: Secondary | ICD-10-CM | POA: Diagnosis not present

## 2019-09-06 DIAGNOSIS — I4821 Permanent atrial fibrillation: Secondary | ICD-10-CM | POA: Diagnosis not present

## 2019-09-06 DIAGNOSIS — I350 Nonrheumatic aortic (valve) stenosis: Secondary | ICD-10-CM | POA: Diagnosis not present

## 2019-09-06 DIAGNOSIS — M109 Gout, unspecified: Secondary | ICD-10-CM | POA: Diagnosis not present

## 2019-09-06 DIAGNOSIS — R001 Bradycardia, unspecified: Secondary | ICD-10-CM | POA: Diagnosis not present

## 2019-09-06 DIAGNOSIS — M810 Age-related osteoporosis without current pathological fracture: Secondary | ICD-10-CM | POA: Diagnosis not present

## 2019-09-06 DIAGNOSIS — G8929 Other chronic pain: Secondary | ICD-10-CM | POA: Diagnosis not present

## 2019-09-06 DIAGNOSIS — I5032 Chronic diastolic (congestive) heart failure: Secondary | ICD-10-CM | POA: Diagnosis not present

## 2019-10-15 ENCOUNTER — Encounter: Payer: Self-pay | Admitting: Family Medicine

## 2019-10-16 NOTE — Telephone Encounter (Signed)
Yes everyone should get the vaccine

## 2019-10-31 ENCOUNTER — Telehealth: Payer: Self-pay | Admitting: Internal Medicine

## 2019-10-31 NOTE — Telephone Encounter (Signed)
2:30pm: Called patient's daughter [name] to confirm today's 3pm appointment. Tami sent me an e-mail to reschedule, as she mentioned she was really busy at work to day.  I left Tami my contact info, and requested she call me to reschedule.  Violeta Gelinas NP-C [phone]

## 2019-11-10 ENCOUNTER — Other Ambulatory Visit: Payer: Self-pay | Admitting: Physician Assistant

## 2019-11-14 ENCOUNTER — Telehealth: Payer: Self-pay | Admitting: Family Medicine

## 2019-11-14 NOTE — Chronic Care Management (AMB) (Signed)
  Chronic Care Management   Note  11/14/2019 Name: SHIVALI QUACKENBUSH MRN: [id number] DOB: 04/29/1926  HADASSAH RANA is a 86 y.o. year old female who is a primary care patient of Laurey Morale, MD. I reached out to Koleen Nimrod by phone today in response to a referral sent by Ms. Jarrett Ables Huxtable's PCP, Laurey Morale, MD. Daughter, Phyllis Ginger gave verbal consent. Tammy Rich and sister, Marlane Mingle will be speaking to the pharmacist on behalf of their mother, Kalijah Westfall. Hart.  Ms. Bellizzi was given information about Chronic Care Management services today including:  1. CCM service includes personalized support from designated clinical staff supervised by her physician, including individualized plan of care and coordination with other care providers 2. 24/7 contact phone numbers for assistance for urgent and routine care needs. 3. Service will only be billed when office clinical staff spend 20 minutes or more in a month to coordinate care. 4. Only one practitioner may furnish and bill the service in a calendar month. 5. The patient may stop CCM services at any time (effective at the end of the month) by phone call to the office staff. 6. The patient will be responsible for cost sharing (co-pay) of up to 20% of the service fee (after annual deductible is met).  Patient agreed to services and verbal consent obtained.   Follow up plan:   Raynicia Dukes [employer] Scheduler

## 2019-11-16 NOTE — Chronic Care Management (AMB) (Signed)
Chronic Care Management Pharmacy  Name: [name]  MRN: [id number] DOB: [birth year]  Initial Questions: 1. Have you seen any other providers since your last visit? No  2. Any changes in your medicines or health? No   Chief Complaint/ HPI  [name],  86 y.o. , female presents for their Initial CCM visit with the clinical pharmacist via telephone.  PCP : Laurey Morale, MD  Their chronic conditions include: HTN, Atrial fibrillation, CHF, Hypothyroidism, Osteoarthritis, Hyperlipidemia, Gout  Office Visits: 06/16/2019- Patient presented in office to Dr. Alysia Penna, MD for pressure wound. Patient has sacral decubitus ulcer and daugher has been using Neosporin with mixed results. Recommended to use Tegaderm patches daily and recheck in 2 weeks.   05/22/2019- Patient presented via telemedicine to Dr. Alysia Penna, MD for UTI and pain management. Patient given Keflex TID for 7 days and then switching to 1 tablet daily for maintenance. Percocet 5/325mg , 2 tablets every six hours as needed for severe pain refilled for osteoarthritis.   Medications: Outpatient Encounter Medications as of 11/17/2019  Medication Sig  . allopurinol (ZYLOPRIM) 100 MG tablet Take 100 mg by mouth daily.  . cephALEXin (KEFLEX) 500 MG capsule Take 1 capsule (500 mg total) by mouth daily.  [name] diltiazem (CARDIZEM CD) 240 MG 24 hr capsule TAKE 1 CAPSULE BY MOUTH EVERY DAY  . ELIQUIS 2.5 MG TABS tablet TAKE 1 TABLET BY MOUTH TWICE A DAY  . furosemide (LASIX) 20 MG tablet TAKE 1 TABLET BY MOUTH EVERY OTHER DAY  . KLOR-CON M10 10 MEQ tablet TAKE 1 TABLET (10 MEQ TOTAL) BY MOUTH 2 (TWO) TIMES DAILY.  [name] levothyroxine (SYNTHROID) 50 MCG tablet Take 1 tablet (50 mcg total) by mouth daily.  . metoprolol succinate (TOPROL-XL) 50 MG 24 hr tablet TAKE 1 TABLET BY MOUTH TWICE A DAY  . oxyCODONE-acetaminophen (PERCOCET/ROXICET) 5-325 MG tablet Take 2 tablets by mouth every 6 (six) hours as needed for severe pain. Patient  takes 1 tablet at bedtime.  . pravastatin (PRAVACHOL) 10 MG tablet TAKE 1 TABLET BY MOUTH 3 TIMES A WEEK, MONDAYS, WEDNESDAYS, AND FRIDAYS  . Probiotic Product (ALIGN PO) Take by mouth. Take 1 capsule by mouth at night.   No facility-administered encounter medications on file as of 11/17/2019.     Current Diagnosis/Assessment: Goals Addressed            This Visit's Progress   . Pharmacy Care Plan       Current Barriers:  . Chronic Disease Management support, education, and care coordination needs related to Atrial Fibrillation, CHF, HTN, HLD, and gout, osteoarthritis  Pharmacist Clinical Goal(s):  [name] Cholesterol goals: Total Cholesterol goal under 200, Triglycerides goal under 150, HDL goal above 40 (men) or above 50 (women), LDL goal under 100 . [name] Prevent gout flares and pain. . Check weight daily and contact physician if weight gain of more than 3 pounds overnight or more than 5 pounds in a week.  . Maintain TSH between 0.45 to 4.5uIU/ml . Maintain pain control . Prevent urinary infections.  Interventions: . Comprehensive medication review performed. . Discussed monitoring for signs and symptoms for bleeding (coughing up blood, prolonged nose bleeds, black, tarry stools) . Discussed daily maximum of acetaminophen: 3g per day.  Patient Self Care Activities:  . Calls provider office for new concerns or questions . Continue current medications as directed by providers.   Initial goal documentation         AFIB  Patient is currently  rate controlled. HR: cannot provider any readings. (nurse goes to patient's home and checks vitals).   Patient has failed these meds in past: digoxin  Patient is currently managed on the following medications: diltiazem 240mg , 1 capsule daily, Metoprolol succinate 50mg , 1 tablet twice daily.  Anticoagualation: Eliquis 2.5mg , 1 tablet twice daily. (dose appropriate- patient is  ?86 years of age and weighs ?60 kg  We discussed:  monitoring  signs and symptoms for bleeding (coughing up blood, proloned nose bleeds, black, tarry stools).   Plan Continue current medications.    Heart Failure   Type: Diastolic  Last ejection fraction: 55% (10/16/2013)  Patient has failed these meds in past: none Patient is currently controlled on the following medications: furosemide 20mg , 1 tablet every other day, potassium chloride 61mEq, 1 tablet twice daily   We discussed weighing daily; if you gain more than 3 pounds in one day or 5 pounds in one week call your doctor.  Weight has been stable around 88 lbs.  Plan Continue current medications.   Hypertension   BP today is:  <130/80.   Office blood pressures are  BP Readings from Last 3 Encounters:  01/27/19 118/78  11/19/18 (!) 146/97  10/17/18 104/80    Patient has failed these meds in the past: amlodipine, atenolol, losartan.   Patient checks BP at home daily.   Patient home BP readings are ranging:111/62 mmHg (last Wednesday)  Patient controlled on: metoprolol succinate 50mg , 1 tablet twice daily, diltiazem CD 240mg , 1 capsule daily   We discussed diet and affect on blood pressure. Currently patient does not have much of an appetite. Diet consists of 2-3 Ensures/ day.   Plan Denies dizziness/ lightheadedness.  Continue current medications.    Hyperlipidemia   Lipid Panel     Component Value Date/Time   CHOL 146 12/17/2016 1233   TRIG 87.0 12/17/2016 1233   HDL 38.00 (L) 12/17/2016 1233   CHOLHDL 4 12/17/2016 1233   VLDL 17.4 12/17/2016 1233   LDLCALC 91 12/17/2016 1233   LDLDIRECT 141.5 08/06/2010 0944     ASCVD 10-year risk: unable to calculate due to age.   Patient has failed these meds in past: none Patient is currently controlled on the following medications: pravastatin 10mg , 1 tablet Monday, Wednesday, Fridays.  (always run out of).   Plan Continue current medications.  Refill for Pravastatin was requested.   Hypothyroidism   TSH  Date  Value Ref Range Status  06/24/2018 4.82 (H) 0.35 - 4.50 uIU/mL Final    Patient has failed these meds in past: none  Patient is currently managed on the following medications: levothyroxine 26mcg, 1 tablet once daily   Plan Continue current medications.   Ostesoarthritis  Patient is currently controlled on the following medications: Percocet 5/325mg  refilled for osteoarthritis. Percocet 5/325mg , 2 tablets every six hours as needed for severe pain, Tylenol Extra Strength 500mg  1 tablet in the morning as needed.   We discussed daily maximum of acetaminophen of 3g/ day. Average daily intake: 825mg .   Plan Continue current medications.   Gout  Per daughter, patient has not had a gout flare-up in years.   Patient has failed these meds in past: none Patient is currently controlled on the following medications: allopurinol 100mg , 1 tablet daily.    Plan Continue current medications.    Recurrent UTI   Patient is currently controlled on the following medications: cephalexin 500mg , 1 capsule daily for maintenance.   Patient recently started taking Align, 1 at night  for diarrhea. Per daughter, diarrhea is improving.   Plan Continue current medications.   Medication Management   Patient manages medications: daughter fills pill box weekly.  Cost barriers: denies.   Follow up Follow up appointment in 6 months.   Anson Crofts, PharmD Clinical Pharmacist [facility] [place] [phone]

## 2019-11-17 ENCOUNTER — Other Ambulatory Visit: Payer: Self-pay

## 2019-11-17 ENCOUNTER — Ambulatory Visit: Payer: PPO

## 2019-11-17 DIAGNOSIS — I10 Essential (primary) hypertension: Secondary | ICD-10-CM

## 2019-11-17 DIAGNOSIS — I4821 Permanent atrial fibrillation: Secondary | ICD-10-CM

## 2019-11-17 NOTE — Patient Instructions (Addendum)
Visit Information  Goals Addressed            This Visit's Progress   . Pharmacy Care Plan       Current Barriers:  . Chronic Disease Management support, education, and care coordination needs related to Atrial Fibrillation, CHF, HTN, HLD, and gout, osteoarthritis  Pharmacist Clinical Goal(s):  [name] Cholesterol goals: Total Cholesterol goal under 200, Triglycerides goal under 150, HDL goal above 40 (men) or above 50 (women), LDL goal under 100 . [name] Prevent gout flares and pain. . Check weight daily and contact physician if weight gain of more than 3 pounds overnight or more than 5 pounds in a week.  . Maintain TSH between 0.45 to 4.5uIU/ml . Maintain pain control . Prevent urinary infections.  Interventions: . Comprehensive medication review performed. . Discussed monitoring for signs and symptoms for bleeding (coughing up blood, prolonged nose bleeds, black, tarry stools) . Discussed daily maximum of acetaminophen: 3g per day.  Patient Self Care Activities:  . Calls provider office for new concerns or questions . Continue current medications as directed by providers.   Initial goal documentation        [name] was given information about Chronic Care Management services today including:  1. CCM service includes personalized support from designated clinical staff supervised by her physician, including individualized plan of care and coordination with other care providers 2. 24/7 contact phone numbers for assistance for urgent and routine care needs. 3. Service will only be billed when office clinical staff spend 20 minutes or more in a month to coordinate care. 4. Only one practitioner may furnish and bill the service in a calendar month. 5. The patient may stop CCM services at any time (effective at the end of the month) by phone call to the office staff. 6. The patient will be responsible for cost sharing (co-pay) of up to 20% of the service fee (after annual deductible is  met).  Patient agreed to services and verbal consent obtained.   Print copy of patient instructions provided.  Telephone follow up appointment with pharmacy team member scheduled for: 59 months  Anson Crofts, PharmD Clinical Pharmacist [facility] [phone]   Atrial Fibrillation  Atrial fibrillation is a type of heartbeat that is irregular or fast. If you have this condition, your heart beats without any order. This makes it hard for your heart to pump blood in a normal way. Atrial fibrillation may come and go, or it may become a long-lasting problem. If this condition is not treated, it can put you at higher risk for stroke, heart failure, and other heart problems. What are the causes? This condition may be caused by diseases that damage the heart. They include:  High blood pressure.  Heart failure.  Heart valve disease.  Heart surgery. Other causes include:  Diabetes.  Thyroid disease.  Being overweight.  Kidney disease. Sometimes the cause is not known. What increases the risk? You are more likely to develop this condition if:  You are older.  You smoke.  You exercise often and very hard.  You have a family history of this condition.  You are a man.  You use drugs.  You drink a lot of alcohol.  You have lung conditions, such as emphysema, pneumonia, or COPD.  You have sleep apnea. What are the signs or symptoms? Common symptoms of this condition include:  A feeling that your heart is beating very fast.  Chest pain or discomfort.  Feeling short of breath.  Suddenly feeling light-headed or weak.  Getting tired easily during activity.  Fainting.  Sweating. In some cases, there are no symptoms. How is this treated? Treatment for this condition depends on underlying conditions and how you feel when you have atrial fibrillation. They include:  Medicines to: ? Prevent blood clots. ? Treat heart rate or heart  rhythm problems.  Using devices, such as a pacemaker, to correct heart rhythm problems.  Doing surgery to remove the part of the heart that sends bad signals.  Closing an area where clots can form in the heart (left atrial appendage). In some cases, your doctor will treat other underlying conditions. Follow these instructions at home: Medicines  Take over-the-counter and prescription medicines only as told by your doctor.  Do not take any new medicines without first talking to your doctor.  If you are taking blood thinners: ? Talk with your doctor before you take any medicines that have aspirin or NSAIDs, such as ibuprofen, in them. ? Take your medicine exactly as told by your doctor. Take it at the same time each day. ? Avoid activities that could hurt or bruise you. Follow instructions about how to prevent falls. ? Wear a bracelet that says you are taking blood thinners. Or, carry a card that lists what medicines you take. Lifestyle      Do not use any products that have nicotine or tobacco in them. These include cigarettes, e-cigarettes, and chewing tobacco. If you need help quitting, ask your doctor.  Eat heart-healthy foods. Talk with your doctor about the right eating plan for you.  Exercise regularly as told by your doctor.  Do not drink alcohol.  Lose weight if you are overweight.  Do not use drugs, including cannabis. General instructions  If you have a condition that causes breathing to stop for a short period of time (apnea), treat it as told by your doctor.  Keep a healthy weight. Do not use diet pills unless your doctor says they are safe for you. Diet pills may make heart problems worse.  Keep all follow-up visits as told by your doctor. This is important. Contact a doctor if:  You notice a change in the speed, rhythm, or strength of your heartbeat.  You are taking a blood-thinning medicine and you get more bruising.  You get tired more easily when you  move or exercise.  You have a sudden change in weight. Get help right away if:   You have pain in your chest or your belly (abdomen).  You have trouble breathing.  You have side effects of blood thinners, such as blood in your vomit, poop (stool), or pee (urine), or bleeding that cannot stop.  You have any signs of a stroke. "BE FAST" is an easy way to remember the main warning signs: ? B - Balance. Signs are dizziness, sudden trouble walking, or loss of balance. ? E - Eyes. Signs are trouble seeing or a change in how you see. ? F - Face. Signs are sudden weakness or loss of feeling in the face, or the face or eyelid drooping on one side. ? A - Arms. Signs are weakness or loss of feeling in an arm. This happens suddenly and usually on one side of the body. ? S - Speech. Signs are sudden trouble speaking, slurred speech, or trouble understanding what people say. ? T - Time. Time to call emergency services. Write down what time symptoms started.  You have other  signs of a stroke, such as: ? A sudden, very bad headache with no known cause. ? Feeling like you may vomit (nausea). ? Vomiting. ? A seizure. These symptoms may be an emergency. Do not wait to see if the symptoms will go away. Get medical help right away. Call your local emergency services (911 in the [place] Do not drive yourself to the hospital. Summary  Atrial fibrillation is a type of heartbeat that is irregular or fast.  You are at higher risk of this condition if you smoke, are older, have diabetes, or are overweight.  Follow your doctor's instructions about medicines, diet, exercise, and follow-up visits.  Get help right away if you have signs or symptoms of a stroke.  Get help right away if you cannot catch your breath, or you have chest pain or discomfort. This information is not intended to replace advice given to you by your health care provider. Make sure you discuss any questions you have with your health care  provider. Document Revised: 03/15/2019 Document Reviewed: 03/15/2019 Elsevier Patient Education  [employer]   Hypertension, Adult Hypertension is another name for high blood pressure. High blood pressure forces your heart to work harder to pump blood. This can cause problems over time. There are two numbers in a blood pressure reading. There is a top number (systolic) over a bottom number (diastolic). It is best to have a blood pressure that is below 120/80. Healthy choices can help lower your blood pressure, or you may need medicine to help lower it. What are the causes? The cause of this condition is not known. Some conditions may be related to high blood pressure. What increases the risk?  Smoking.  Having type 2 diabetes mellitus, high cholesterol, or both.  Not getting enough exercise or physical activity.  Being overweight.  Having too much fat, sugar, calories, or salt (sodium) in your diet.  Drinking too much alcohol.  Having long-term (chronic) kidney disease.  Having a family history of high blood pressure.  Age. Risk increases with age.  Race. You may be at higher risk if you are African American.  Gender. Men are at higher risk than women before age 48. After age 54, women are at higher risk than men.  Having obstructive sleep apnea.  Stress. What are the signs or symptoms?  High blood pressure may not cause symptoms. Very high blood pressure (hypertensive crisis) may cause: ? Headache. ? Feelings of worry or nervousness (anxiety). ? Shortness of breath. ? Nosebleed. ? A feeling of being sick to your stomach (nausea). ? Throwing up (vomiting). ? Changes in how you see. ? Very bad chest pain. ? Seizures. How is this treated?  This condition is treated by making healthy lifestyle changes, such as: ? Eating healthy foods. ? Exercising more. ? Drinking less alcohol.  Your health care provider may prescribe medicine if lifestyle changes are not  enough to get your blood pressure under control, and if: ? Your top number is above 130. ? Your bottom number is above 80.  Your personal target blood pressure may vary. Follow these instructions at home: Eating and drinking   If told, follow the DASH eating plan. To follow this plan: ? Fill one half of your plate at each meal with fruits and vegetables. ? Fill one fourth of your plate at each meal with whole grains. Whole grains include whole-wheat pasta, brown rice, and whole-grain bread. ? Eat or drink low-fat dairy products, such as skim milk  or low-fat yogurt. ? Fill one fourth of your plate at each meal with low-fat (lean) proteins. Low-fat proteins include fish, chicken without skin, eggs, beans, and tofu. ? Avoid fatty meat, cured and processed meat, or chicken with skin. ? Avoid pre-made or processed food.  Eat less than 1,500 mg of salt each day.  Do not drink alcohol if: ? Your doctor tells you not to drink. ? You are pregnant, may be pregnant, or are planning to become pregnant.  If you drink alcohol: ? Limit how much you use to:  0-1 drink a day for women.  0-2 drinks a day for men. ? Be aware of how much alcohol is in your drink. In the [place] one drink equals one 12 oz bottle of beer (355 mL), one 5 oz glass of wine (148 mL), or one 1 oz glass of hard liquor (44 mL). Lifestyle   Work with your doctor to stay at a healthy weight or to lose weight. Ask your doctor what the best weight is for you.  Get at least 30 minutes of exercise most days of the week. This may include walking, swimming, or biking.  Get at least 30 minutes of exercise that strengthens your muscles (resistance exercise) at least 3 days a week. This may include lifting weights or doing Pilates.  Do not use any products that contain nicotine or tobacco, such as cigarettes, e-cigarettes, and chewing tobacco. If you need help quitting, ask your doctor.  Check your blood pressure at home as told by  your doctor.  Keep all follow-up visits as told by your doctor. This is important. Medicines  Take over-the-counter and prescription medicines only as told by your doctor. Follow directions carefully.  Do not skip doses of blood pressure medicine. The medicine does not work as well if you skip doses. Skipping doses also puts you at risk for problems.  Ask your doctor about side effects or reactions to medicines that you should watch for. Contact a doctor if you:  Think you are having a reaction to the medicine you are taking.  Have headaches that keep coming back (recurring).  Feel dizzy.  Have swelling in your ankles.  Have trouble with your vision. Get help right away if you:  Get a very bad headache.  Start to feel mixed up (confused).  Feel weak or numb.  Feel faint.  Have very bad pain in your: ? Chest. ? Belly (abdomen).  Throw up more than once.  Have trouble breathing. Summary  Hypertension is another name for high blood pressure.  High blood pressure forces your heart to work harder to pump blood.  For most people, a normal blood pressure is less than 120/80.  Making healthy choices can help lower blood pressure. If your blood pressure does not get lower with healthy choices, you may need to take medicine. This information is not intended to replace advice given to you by your health care provider. Make sure you discuss any questions you have with your health care provider. Document Revised: 06/01/2018 Document Reviewed: 06/01/2018 Elsevier Patient Education  2020 [employer].

## 2019-11-18 NOTE — Progress Notes (Addendum)
Done

## 2019-11-20 ENCOUNTER — Telehealth: Payer: Self-pay

## 2019-11-20 DIAGNOSIS — E785 Hyperlipidemia, unspecified: Secondary | ICD-10-CM

## 2019-11-20 DIAGNOSIS — J439 Emphysema, unspecified: Secondary | ICD-10-CM

## 2019-11-20 NOTE — Telephone Encounter (Signed)
Done

## 2019-11-20 NOTE — Telephone Encounter (Signed)
I am requesting an ambulatory referral to CCM for [facility]. Referral will need to have 2 current diagnosis attached to it. Thank you.

## 2019-11-21 ENCOUNTER — Other Ambulatory Visit: Payer: Self-pay

## 2019-11-21 MED ORDER — PRAVASTATIN SODIUM 10 MG PO TABS: ORAL_TABLET | ORAL | 5 refills | Status: DC

## 2019-12-10 ENCOUNTER — Emergency Department (HOSPITAL_COMMUNITY): Payer: PPO

## 2019-12-10 ENCOUNTER — Encounter (HOSPITAL_COMMUNITY): Payer: Self-pay | Admitting: Emergency Medicine

## 2019-12-10 ENCOUNTER — Other Ambulatory Visit: Payer: Self-pay

## 2019-12-10 ENCOUNTER — Emergency Department (HOSPITAL_COMMUNITY)
Admission: EM | Admit: 2019-12-10 | Discharge: 2019-12-10 | Disposition: A | Discharge status: Home / Self Care | Payer: PPO | Attending: Emergency Medicine | Admitting: Emergency Medicine

## 2019-12-10 DIAGNOSIS — R410 Disorientation, unspecified: Secondary | ICD-10-CM | POA: Diagnosis not present

## 2019-12-10 DIAGNOSIS — Z79899 Other long term (current) drug therapy: Secondary | ICD-10-CM | POA: Diagnosis not present

## 2019-12-10 DIAGNOSIS — E039 Hypothyroidism, unspecified: Secondary | ICD-10-CM | POA: Diagnosis not present

## 2019-12-10 DIAGNOSIS — I110 Hypertensive heart disease with heart failure: Secondary | ICD-10-CM | POA: Insufficient documentation

## 2019-12-10 DIAGNOSIS — W19XXXA Unspecified fall, initial encounter: Secondary | ICD-10-CM | POA: Diagnosis not present

## 2019-12-10 DIAGNOSIS — Z87891 Personal history of nicotine dependence: Secondary | ICD-10-CM | POA: Insufficient documentation

## 2019-12-10 DIAGNOSIS — R102 Pelvic and perineal pain: Secondary | ICD-10-CM | POA: Insufficient documentation

## 2019-12-10 DIAGNOSIS — T68XXXA Hypothermia, initial encounter: Secondary | ICD-10-CM | POA: Diagnosis not present

## 2019-12-10 DIAGNOSIS — I5032 Chronic diastolic (congestive) heart failure: Secondary | ICD-10-CM | POA: Insufficient documentation

## 2019-12-10 DIAGNOSIS — R680 Hypothermia, not associated with low environmental temperature: Secondary | ICD-10-CM | POA: Diagnosis not present

## 2019-12-10 DIAGNOSIS — Z7901 Long term (current) use of anticoagulants: Secondary | ICD-10-CM | POA: Insufficient documentation

## 2019-12-10 DIAGNOSIS — R404 Transient alteration of awareness: Secondary | ICD-10-CM

## 2019-12-10 DIAGNOSIS — X31XXXA Exposure to excessive natural cold, initial encounter: Secondary | ICD-10-CM | POA: Diagnosis not present

## 2019-12-10 DIAGNOSIS — I4891 Unspecified atrial fibrillation: Secondary | ICD-10-CM | POA: Insufficient documentation

## 2019-12-10 DIAGNOSIS — S0990XA Unspecified injury of head, initial encounter: Secondary | ICD-10-CM | POA: Diagnosis not present

## 2019-12-10 DIAGNOSIS — S3993XA Unspecified injury of pelvis, initial encounter: Secondary | ICD-10-CM | POA: Diagnosis not present

## 2019-12-10 LAB — URINALYSIS, ROUTINE W REFLEX MICROSCOPIC
Bilirubin Urine: NEGATIVE
Glucose, UA: NEGATIVE mg/dL
Hgb urine dipstick: NEGATIVE
Ketones, ur: NEGATIVE mg/dL
Leukocytes,Ua: NEGATIVE
Nitrite: NEGATIVE
Protein, ur: NEGATIVE mg/dL
Specific Gravity, Urine: 1.014 (ref 1.005–1.030)
pH: 6 (ref 5.0–8.0)

## 2019-12-10 LAB — CBC WITH DIFFERENTIAL/PLATELET
Abs Immature Granulocytes: 0.05 10*3/uL (ref 0.00–0.07)
Basophils Absolute: 0.1 10*3/uL (ref 0.0–0.1)
Basophils Relative: 1 %
Eosinophils Absolute: 0.1 10*3/uL (ref 0.0–0.5)
Eosinophils Relative: 1 %
HCT: 40.3 % (ref 36.0–46.0)
Hemoglobin: 12.9 g/dL (ref 12.0–15.0)
Immature Granulocytes: 1 %
Lymphocytes Relative: 26 %
Lymphs Abs: 2.5 10*3/uL (ref 0.7–4.0)
MCH: 33.9 pg (ref 26.0–34.0)
MCHC: 32 g/dL (ref 30.0–36.0)
MCV: 105.8 fL — ABNORMAL HIGH (ref 80.0–100.0)
Monocytes Absolute: 0.5 10*3/uL (ref 0.1–1.0)
Monocytes Relative: 6 %
Neutro Abs: 6.2 10*3/uL (ref 1.7–7.7)
Neutrophils Relative %: 65 %
Platelets: 230 10*3/uL (ref 150–400)
RBC: 3.81 MIL/uL — ABNORMAL LOW (ref 3.87–5.11)
RDW: 13.2 % (ref 11.5–15.5)
WBC: 9.4 10*3/uL (ref 4.0–10.5)
nRBC: 0 % (ref 0.0–0.2)

## 2019-12-10 LAB — COMPREHENSIVE METABOLIC PANEL
ALT: 13 U/L (ref 0–44)
AST: 28 U/L (ref 15–41)
Albumin: 3.9 g/dL (ref 3.5–5.0)
Alkaline Phosphatase: 72 U/L (ref 38–126)
Anion gap: 12 (ref 5–15)
BUN: 23 mg/dL (ref 8–23)
CO2: 23 mmol/L (ref 22–32)
Calcium: 9.5 mg/dL (ref 8.9–10.3)
Chloride: 100 mmol/L (ref 98–111)
Creatinine, Ser: 0.96 mg/dL (ref 0.44–1.00)
GFR calc Af Amer: 60 mL/min — ABNORMAL LOW (ref >60)
GFR calc non Af Amer: 52 mL/min — ABNORMAL LOW (ref >60)
Glucose, Bld: 149 mg/dL — ABNORMAL HIGH (ref 70–99)
Potassium: 4.4 mmol/L (ref 3.5–5.1)
Sodium: 135 mmol/L (ref 135–145)
Total Bilirubin: 0.7 mg/dL (ref 0.3–1.2)
Total Protein: 8.2 g/dL — ABNORMAL HIGH (ref 6.5–8.1)

## 2019-12-10 NOTE — ED Notes (Signed)
Pt at CT
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
## 2019-12-10 NOTE — ED Provider Notes (Signed)
[facility] Provider Note   CSN: [id number] Arrival date & time: 12/10/19  0246     History Chief Complaint  Patient presents with  . Cold Exposure    [name] is a 86 y.o. female.  HPI     This is a 86 year old female with a history of aortic stenosis, chronic atrial fibrillation on Eliquis, heart failure who presents after being found outside.  Per EMS, patient was found outside her apartment complex by bystander wearing her nightgown.  Patient reports that she heard the fire alarm.  She was dressed for bed.  She reports that she walked outside with her cane.  She reports that she is new to the apartment complex and became disoriented.  She is not sure how long she was outside.  She does report that she tripped and fell because the ground was wet.  She denies any pain right now.  She does states that she is cold.  Rectal temp upon arrival 91.5.  Denies chest pain, shortness of breath, syncope, nausea, vomiting, headache.  Per patient she is DNR.  She is awake, alert, and oriented x4.  Spoke with patient's daughter over the phone.  She is at the bedside.  Patient is DNR.  She is also on palliative care for her heart failure.  She states that she lives alone in an apartment complex.  She has lived there for 15 years.  She states that recently she has become confused intermittently but has never wandered off.  They have video cameras in her apartment and plan to put up alarms.  Past Medical History:  Diagnosis Date  . Aortic stenosis    a. mild on echo 10/2013  . Blood clotting disorder ([place])   . Bradycardia    a. Holter (1/15):  avg HR 78, 1.4% PVCs, several pauses </= 3 sec, b.  Holter (01/2014):  AFib, freq PVCs, HR 51-127, Avg HR 88, longest pause 2.9 sec - no changes made  . Carotid stenosis    a. Carotid US (4/14):  RICA 40-59%;  b. Carotid US (01/2014) bilateral 1-39%  . Chronic atrial fibrillation (HCC)    a. permanent;  b. chronic  coumadin;  c. 10/2013 digoxin and atenolol d/c'd 2/2 pauses of <3 sec;  d. 01/2014 digoxin resumed @ 0.125 mg in setting of difficult to control rate and diast chf.  . Chronic diastolic heart failure ([place])    a. Echo (6/08):  EF 55-60%, mild MR;  b. Echo (10/2013):  EF 55%, mild AS  . Contact dermatitis and other eczema, due to unspecified cause    a. h/o myalgias with atorvastatin.  . Diverticulosis of colon (without mention of hemorrhage)   . DJD (degenerative joint disease)   . Frequent UTI    Dr. Terance Hart sees pt  . Gout, unspecified   . H/O: CVA (cardiovascular accident) 2008  . History of CVA (cerebrovascular accident)    2008  . History of melanoma    on right ear  . Hyperlipidemia   . Hypertension    a. hx of edema with Amlodipine.  . Hypothyroidism   . Lumbago    chronic low back pain  . Osteoporosis    las DEXA on 01/30/10  . Skin cancer    forehead  . Urticaria, unspecified     Patient Active Problem List   Diagnosis Date Noted  . Pressure injury of sacral region, stage 1 06/16/2019  . COPD (chronic obstructive pulmonary disease)  with emphysema ([place]) 11/22/2017  . Encounter for therapeutic drug monitoring 11/03/2013  . Chronic diastolic CHF (congestive heart failure) ([place]) 10/10/2013  . Personal history of colonic polyps 10/14/2011  . Anemia 10/07/2011  . Long term (current) use of anticoagulants 06/03/2011  . Internal hemorrhoids without mention of complication [id number]  . OSTEOPOROSIS 08/06/2010  . Carotid Stenosis 05/06/2010  . CEREBRAL EMBOLISM WITH CEREBRAL INFARCTION 05/28/2009  . ECZEMA 10/29/2008  . UTI'S, RECURRENT 08/28/2008  . LOW BACK PAIN, CHRONIC 01/31/2008  . LEG PAIN, BILATERAL 01/09/2008  . Hypothyroidism 10/18/2007  . Dyslipidemia 10/18/2007  . GOUT 10/18/2007  . Essential hypertension 10/18/2007  . Permanent atrial fibrillation ([place]) 10/18/2007  . DIVERTICULOSIS, COLON 10/18/2007  . Osteoarthritis 10/18/2007    Past Surgical History:    Procedure Laterality Date  . ABDOMINAL HYSTERECTOMY    . BAND HEMORRHOIDECTOMY  last on 10-28-11   x 2, per Dr. Deatra Ina   . COLONOSCOPY  07-02-11   Dr. Deatra Ina, adenomatous polyp, no repeats are planned   . FLEXIBLE SIGMOIDOSCOPY  10/28/2011   Procedure: FLEXIBLE SIGMOIDOSCOPY;  Surgeon: Inda Castle, MD;  Location: [facility];  Service: Endoscopy;  Laterality: N/A;  . GASTRIC VARICES BANDING  10/28/2011   Procedure: HEMORRHOID BANDING;  Surgeon: Inda Castle, MD;  Location: [facility];  Service: Endoscopy;  Laterality: N/A;  . SKIN CANCER EXCISION  2001   removed right ear per Dr. Denna Haggard, skin grafts harvested from right cheek  . TONSILLECTOMY       OB History   No obstetric history on file.     Family History  Problem Relation Age of Onset  . Colon polyps Mother   . Hypertension Mother   . Stroke Father   . Coronary artery disease Other        sister  . COPD Other        sister  . Immunodeficiency Other        sister  . Anemia Other        sister  . Anemia Sister   . Colon cancer Neg Hx   . Heart attack Neg Hx     Social History   Tobacco Use  . Smoking status: Former Smoker    Quit date: 10/05/1994    Years since quitting: 25.1  . Smokeless tobacco: Never Used  Substance Use Topics  . Alcohol use: No    Alcohol/week: 0.0 standard drinks  . Drug use: No    Home Medications Prior to Admission medications   Medication Sig Start Date End Date Taking? Authorizing Provider  allopurinol (ZYLOPRIM) 100 MG tablet Take 100 mg by mouth daily.   Yes [provider]  cephALEXin (KEFLEX) 500 MG capsule Take 1 capsule (500 mg total) by mouth daily. 07/18/19  Yes Laurey Morale, MD  diltiazem (CARDIZEM CD) 240 MG 24 hr capsule TAKE 1 CAPSULE BY MOUTH EVERY DAY Patient taking differently: Take 240 mg by mouth daily.  07/12/19  Yes Weaver, Scott T, PA-C  ELIQUIS 2.5 MG TABS tablet TAKE 1 TABLET BY MOUTH TWICE A DAY Patient taking differently: Take 2.5 mg by  mouth 2 (two) times daily.  07/10/19  Yes Larey Dresser, MD  furosemide (LASIX) 20 MG tablet TAKE 1 TABLET BY MOUTH EVERY OTHER DAY Patient taking differently: Take 20 mg by mouth every other day.  07/06/19  Yes Weaver, Scott T, PA-C  KLOR-CON M10 10 MEQ tablet TAKE 1 TABLET (10 MEQ TOTAL) BY MOUTH 2 (TWO) TIMES DAILY. Patient  taking differently: Take 10 mEq by mouth 2 (two) times daily.  11/10/19  Yes Bhagat, Bhavinkumar, PA  levothyroxine (SYNTHROID) 50 MCG tablet Take 1 tablet (50 mcg total) by mouth daily. 07/18/19  Yes Laurey Morale, MD  metoprolol succinate (TOPROL-XL) 50 MG 24 hr tablet TAKE 1 TABLET BY MOUTH TWICE A DAY Patient taking differently: Take 50 mg by mouth in the morning and at bedtime.  07/12/19  Yes Weaver, Scott T, PA-C  pravastatin (PRAVACHOL) 10 MG tablet TAKE 1 TABLET BY MOUTH 3 TIMES A WEEK, MONDAYS, WEDNESDAYS, AND FRIDAYS Patient taking differently: Take 10 mg by mouth every Monday, Wednesday, and Friday.  11/21/19  Yes Laurey Morale, MD  Probiotic Product (ALIGN PO) Take 1 capsule by mouth at bedtime.    Yes [provider]    Allergies    Amlodipine, Amoxicillin, Hydrocodone-acetaminophen, Nitrofurantoin, and Sulfonamide derivatives  Review of Systems   Review of Systems  Constitutional: Negative for fever.  Respiratory: Negative for shortness of breath.   Cardiovascular: Negative for chest pain.  Gastrointestinal: Negative for abdominal pain, nausea and vomiting.  Genitourinary: Negative for dysuria.  Neurological: Negative for weakness and numbness.  All other systems reviewed and are negative.   Physical Exam Updated Vital Signs BP 115/79   Pulse (!) 112   Temp (!) 94.5 F (34.7 C) (Rectal)   Resp (!) 21   SpO2 97%   Physical Exam Vitals and nursing note reviewed.  Constitutional:      Appearance: She is well-developed.     Comments: Elderly but nontoxic-appearing, no acute distress  HENT:     Head: Normocephalic and atraumatic.      Mouth/Throat:     Mouth: Mucous membranes are dry.  Eyes:     Pupils: Pupils are equal, round, and reactive to light.  Cardiovascular:     Rate and Rhythm: Tachycardia present. Rhythm irregular.     Heart sounds: Normal heart sounds.  Pulmonary:     Effort: Pulmonary effort is normal. No respiratory distress.     Breath sounds: No wheezing.  Abdominal:     General: Bowel sounds are normal.     Palpations: Abdomen is soft.     Tenderness: There is no abdominal tenderness.  Musculoskeletal:        General: No deformity or signs of injury.     Cervical back: Normal range of motion and neck supple.     Right lower leg: No edema.     Left lower leg: No edema.  Skin:    General: Skin is warm and dry.  Neurological:     Mental Status: She is alert and oriented to person, place, and time.  Psychiatric:        Mood and Affect: Mood normal.     ED Results / Procedures / Treatments   Labs (all labs ordered are listed, but only abnormal results are displayed) Labs Reviewed  CBC WITH DIFFERENTIAL/PLATELET - Abnormal; Notable for the following components:      Result Value   RBC 3.81 (*)    MCV 105.8 (*)    All other components within normal limits  COMPREHENSIVE METABOLIC PANEL - Abnormal; Notable for the following components:   Glucose, Bld 149 (*)    Total Protein 8.2 (*)    GFR calc non Af Amer 52 (*)    GFR calc Af Amer 60 (*)    All other components within normal limits  URINALYSIS, ROUTINE W REFLEX MICROSCOPIC  EKG EKG Interpretation  Date/Time:  Sunday December 10 2019 02:54:23 EST Ventricular Rate:  116 PR Interval:    QRS Duration: 109 QT Interval:  346 QTC Calculation: 481 R Axis:   -113 Text Interpretation: Atrial fibrillation Incomplete RBBB and LAFB Abnormal lateral Q waves Anteroseptal infarct, old Artifact in lead(s) I II III aVR aVL aVF V1 V2 V3 V4 V5 V6 Confirmed by Thayer Jew [phone]) on 12/10/2019 3:51:52 AM   Radiology CT Head Wo Contrast  Result  Date: 12/10/2019 CLINICAL DATA:  Head trauma.  Confusion. EXAM: CT HEAD WITHOUT CONTRAST TECHNIQUE: Contiguous axial images were obtained from the base of the skull through the vertex without intravenous contrast. COMPARISON:  02/14/2016 FINDINGS: Brain: Chronic small-vessel ischemic changes affect the pons. No focal cerebellar insult. Cerebral hemispheres show old infarction in the right insula and frontal operculum. Extensive chronic small-vessel ischemic changes are present throughout the cerebral hemispheric white matter. Old lacunar infarction in the left anterolateral thalamus. No mass lesion, hemorrhage, hydrocephalus or extra-axial collection. Vascular: There is atherosclerotic calcification of the major vessels at the base of the brain. Skull: Negative Sinuses/Orbits: Clear/normal Other: None. IMPRESSION: No acute or reversible finding. Brain atrophy. Old right middle cerebral artery infarction affecting the insula and right frontal operculum. Extensive chronic small-vessel ischemic changes throughout the brain elsewhere as above. Electronically Signed   By: Nelson Chimes M.D.   On: 12/10/2019 04:59   CT PELVIS WO CONTRAST  Result Date: 12/10/2019 CLINICAL DATA:  Pelvic trauma EXAM: CT PELVIS WITHOUT CONTRAST TECHNIQUE: Multidetector CT imaging of the pelvis was performed following the standard protocol without intravenous contrast. COMPARISON:  None. FINDINGS: Urinary Tract:  Distended bladder is unremarkable. Bowel:  Colonic diverticulosis. Vascular/Lymphatic: Aortoiliac calcified plaque. Reproductive:  Post hysterectomy.  No pelvic mass. Other:  None. Musculoskeletal: No acute fracture. Degenerative changes of the lower lumbar spine, bilateral sacroiliac joints, and bilateral hip joints. IMPRESSION: No acute fracture. Electronically Signed   By: Macy Mis M.D.   On: 12/10/2019 06:41   DG Pelvis Portable  Result Date: 12/10/2019 CLINICAL DATA:  86 year old female with history of pain and fall.  EXAM: PORTABLE PELVIS 1-2 VIEWS COMPARISON:  No priors. FINDINGS: There is no evidence of pelvic fracture or diastasis. No pelvic bone lesions are seen. Joint space narrowing, subchondral sclerosis, subchondral cyst formation and osteophyte formation noted in the hip joints bilaterally. IMPRESSION: 1. No acute radiographic abnormality of the bony pelvis. 2. Moderate bilateral hip joint osteoarthritis. Electronically Signed   By: Vinnie Langton M.D.   On: 12/10/2019 05:52    Procedures Procedures (including critical care time)  Medications Ordered in ED Medications - No data to display  ED Course  I have reviewed the triage vital signs and the nursing notes.  Pertinent labs & imaging results that were available during my care of the patient were reviewed by me and considered in my medical decision making (see chart for details).    MDM Rules/Calculators/A&P                       Patient presents after being found outside in the elements.  Noted to be hypothermic to 91.5.  She is under a Retail banker.  She gives me a fairly decent history and is oriented for me.  However, there are some elements of her history that suggest she may have been confused.  I spoke with patient's daughter and this is consistent.  She does live independently but has been somewhat more  confused recently.  Otherwise patient is clinically stable.  EKG shows atrial fibrillation which she has a history of.  CT head without any evidence of bleed.  Basic lab work is reassuring.  Urinalysis is pending.  Patient did begin to complain of some pelvic pain.  Initial x-ray is negative but she is severely osteoporotic.  Will obtain CT.  This is also negative.  Discussed with daughter that I feel it is reasonable to have our social work team evaluate for further options regarding home health needs.  We want to make sure that she safely is discharged home.  She will need to ambulate in her urine only to result prior to final  disposition.  Final Clinical Impression(s) / ED Diagnoses Final diagnoses:  Altered awareness, transient  Hypothermia, initial encounter    Rx / DC Orders ED Discharge Orders    None       Rahul Malinak, Barbette Hair, MD 12/10/19 [phone]

## 2019-12-10 NOTE — ED Notes (Signed)
Pt admits to fall tonight but unable to give any history on fall. Scant blood found on clothes but unkown source. No obvious contusions or deformity.

## 2019-12-10 NOTE — ED Notes (Signed)
Pt has yet to provide sample, pt now reporting she does not think she can pee, to do in and out per MD order.

## 2019-12-10 NOTE — ED Triage Notes (Signed)
Pt arrived via [facility] from outside of apratment complex. Per EMS pt found by bystander wandering around outside for approx 1 hour only wearing a nightgown. Per pt she exited her home after hearing an alarm. On arrival pt temp 91.5 rectal. Pt placed under bearhugger.

## 2019-12-10 NOTE — ED Provider Notes (Signed)
Patient found to be hypothermic, has had decline in memory.  Lab work unremarkable.  CT imaging of head unremarkable.  Patient with no urinary tract infection.  Patient currently rewarmed back to 98 degrees.  She feels a lot better.  Family has talked with social work and case management.  Home health orders have been placed.  She is going to follow-up with primary care doctor for more long-term placement as likely worsening dementia/undiagnosed dementia.  Discharged in good condition.  Given return precautions.  This chart was dictated using voice recognition software.  Despite best efforts to proofread,  errors can occur which can change the documentation meaning.     Lennice Sites, DO 12/10/19 [phone]

## 2019-12-10 NOTE — ED Notes (Signed)
Verbal order from night shift EDP to in and out cath pt for urine sample.

## 2019-12-10 NOTE — ED Notes (Signed)
Pt reports to this nurse she thinks she can pee, purewick placed, will continue to monitor.

## 2019-12-10 NOTE — ED Notes (Signed)
Pt verbalizes understanding of d/c instructions. Pt taken to lobby in wheelchair at d/c with all belongings and with family.   

## 2019-12-11 ENCOUNTER — Encounter: Payer: Self-pay | Admitting: Family Medicine

## 2019-12-12 DIAGNOSIS — I4891 Unspecified atrial fibrillation: Secondary | ICD-10-CM | POA: Diagnosis not present

## 2019-12-12 DIAGNOSIS — L89321 Pressure ulcer of left buttock, stage 1: Secondary | ICD-10-CM | POA: Diagnosis not present

## 2019-12-12 DIAGNOSIS — L89311 Pressure ulcer of right buttock, stage 1: Secondary | ICD-10-CM | POA: Diagnosis not present

## 2019-12-12 NOTE — Telephone Encounter (Signed)
noted 

## 2019-12-17 ENCOUNTER — Encounter: Payer: Self-pay | Admitting: Family Medicine

## 2019-12-18 NOTE — Telephone Encounter (Signed)
She will need a PMV, virtual is okay

## 2019-12-20 ENCOUNTER — Telehealth: Payer: Self-pay | Admitting: Internal Medicine

## 2019-12-20 NOTE — Telephone Encounter (Signed)
10:15am TC and VM left on cell of daughter Adron Bene [phone].   I left my contact information, and requested a call back to schedule a f/u Palliative Care home visit.  Violeta Gelinas NP-C [facility] [phone]

## 2019-12-26 ENCOUNTER — Telehealth: Payer: Self-pay | Admitting: Family Medicine

## 2019-12-26 NOTE — Telephone Encounter (Signed)
Pt daughter called stating pt would like a refill for oxycodone for her back pain.   [id number] [address], [place], [place] [address]  Phone:  [phone] Fax:  [phone]

## 2019-12-26 NOTE — Telephone Encounter (Signed)
Message Routed to PCP  for approval. 

## 2019-12-26 NOTE — Telephone Encounter (Signed)
She wold need a PMV

## 2019-12-26 NOTE — Telephone Encounter (Signed)
Patient daughter notified of update  and verbalized understanding. Pt has been scheduled for PMV.

## 2019-12-29 ENCOUNTER — Other Ambulatory Visit: Payer: Self-pay

## 2020-01-01 ENCOUNTER — Encounter: Payer: Self-pay | Admitting: Family Medicine

## 2020-01-01 ENCOUNTER — Ambulatory Visit (INDEPENDENT_AMBULATORY_CARE_PROVIDER_SITE_OTHER): Payer: PPO | Admitting: Family Medicine

## 2020-01-01 ENCOUNTER — Other Ambulatory Visit: Payer: Self-pay

## 2020-01-01 ENCOUNTER — Telehealth: Payer: Self-pay | Admitting: Family Medicine

## 2020-01-01 VITALS — BP 100/62 | HR 129 | Temp 97.0°F | Wt 89.0 lb

## 2020-01-01 DIAGNOSIS — M545 Low back pain, unspecified: Secondary | ICD-10-CM

## 2020-01-01 DIAGNOSIS — F1190 Opioid use, unspecified, uncomplicated: Secondary | ICD-10-CM

## 2020-01-01 DIAGNOSIS — G8929 Other chronic pain: Secondary | ICD-10-CM

## 2020-01-01 MED ORDER — OXYCODONE-ACETAMINOPHEN 10-325 MG PO TABS: 1.0000 | ORAL_TABLET | Freq: Four times a day (QID) | ORAL | 0 refills | Status: AC | PRN

## 2020-01-01 MED ORDER — OXYCODONE-ACETAMINOPHEN 10-325 MG PO TABS: 1.0000 | ORAL_TABLET | Freq: Four times a day (QID) | ORAL | 0 refills | Status: DC | PRN

## 2020-01-01 MED ORDER — NYSTATIN 100000 UNIT/GM EX CREA: 1.0000 "application " | TOPICAL_CREAM | Freq: Two times a day (BID) | CUTANEOUS | 11 refills | Status: AC

## 2020-01-01 NOTE — Progress Notes (Signed)
   Subjective:    Patient ID: [name], female    DOB: 12-18-1925, 86 y.o.   MRN: [id number]  HPI Here for pain management, she is doing well. Her back pain is stable.  Indication for chronic opioid: low back pain  Medication and dose: Percocet 10-325  # pills per month: 120 Last UDS date: 01-01-20 Opioid Treatment Agreement signed (Y/N): 01-01-20 Opioid Treatment Agreement last reviewed with patient:  01-01-20 [place] [facility] reviewed this encounter (include red flags): Yes    Review of Systems     Objective:   Physical Exam        Assessment & Plan:  Pain management, meds were refilled.  Alysia Penna, MD

## 2020-01-01 NOTE — Telephone Encounter (Signed)
Pt's daughter, Adron Bene, stated that insurance will not fill the oxycodone. She believes that it is because he changed the dosage of the medication but is not entirely sure that is the reason. They would like assistance on getting this cleared up.   Tami (on DPR) can be reached at [phone]

## 2020-01-01 NOTE — Telephone Encounter (Signed)
Called and spoke with the patients pharmacy the prescription is covered by insurance and is ready for pick up. Tami is aware. Nothing further needed.

## 2020-01-03 LAB — PAIN MGMT, PROFILE 8 W/CONF, U
6 Acetylmorphine: NEGATIVE ng/mL
Alcohol Metabolites: NEGATIVE ng/mL (ref <500)
Amphetamines: NEGATIVE ng/mL
Benzodiazepines: NEGATIVE ng/mL
Buprenorphine, Urine: NEGATIVE ng/mL
Cocaine Metabolite: NEGATIVE ng/mL
Codeine: NEGATIVE ng/mL
Creatinine: 83.8 mg/dL
Hydrocodone: NEGATIVE ng/mL
Hydromorphone: NEGATIVE ng/mL
MDMA: NEGATIVE ng/mL
Marijuana Metabolite: NEGATIVE ng/mL
Morphine: NEGATIVE ng/mL
Norhydrocodone: NEGATIVE ng/mL
Noroxycodone: 875 ng/mL
Opiates: NEGATIVE ng/mL
Oxidant: NEGATIVE ug/mL
Oxycodone: 717 ng/mL
Oxycodone: POSITIVE ng/mL
Oxymorphone: 974 ng/mL
pH: 6.7 (ref 4.5–9.0)

## 2020-01-04 DIAGNOSIS — L89321 Pressure ulcer of left buttock, stage 1: Secondary | ICD-10-CM | POA: Diagnosis not present

## 2020-01-04 DIAGNOSIS — L89311 Pressure ulcer of right buttock, stage 1: Secondary | ICD-10-CM | POA: Diagnosis not present

## 2020-01-04 DIAGNOSIS — I4891 Unspecified atrial fibrillation: Secondary | ICD-10-CM | POA: Diagnosis not present

## 2020-01-05 ENCOUNTER — Other Ambulatory Visit: Payer: Self-pay | Admitting: Cardiology

## 2020-01-05 NOTE — Telephone Encounter (Signed)
Pt last saw [name], [place] on 01/27/19 video visit Covid-19, last labs 12/10/19 Creat 0.96, age 86, weight 40.4kg, based on specified criteria pt is on appropriate dosage of Eliquis 2.5mg  BID.  Will refill rx.

## 2020-02-01 ENCOUNTER — Other Ambulatory Visit: Payer: Self-pay | Admitting: Physician Assistant

## 2020-02-24 ENCOUNTER — Other Ambulatory Visit: Payer: Self-pay | Admitting: Internal Medicine

## 2020-03-20 ENCOUNTER — Other Ambulatory Visit: Payer: Self-pay | Admitting: Internal Medicine

## 2020-03-29 ENCOUNTER — Other Ambulatory Visit: Payer: Self-pay | Admitting: Physician Assistant

## 2020-04-03 ENCOUNTER — Other Ambulatory Visit: Payer: Self-pay | Admitting: Internal Medicine

## 2020-04-21 ENCOUNTER — Other Ambulatory Visit: Payer: Self-pay | Admitting: Internal Medicine

## 2020-05-16 ENCOUNTER — Other Ambulatory Visit: Payer: Self-pay | Admitting: Internal Medicine

## 2020-05-16 ENCOUNTER — Telehealth: Payer: PPO

## 2020-05-16 ENCOUNTER — Telehealth: Payer: Self-pay | Admitting: Family Medicine

## 2020-05-16 NOTE — Progress Notes (Signed)
°  Chronic Care Management   Note  05/16/2020 Name: [name] MRN: [id number] DOB: 08/11/1926  [name] is a 86 y.o. year old female who is a primary care patient of Laurey Morale, MD. I reached out to Koleen Nimrod by phone today in response to a referral sent by [name]'s PCP, Laurey Morale, MD.   [name] was given information about Chronic Care Management services today including:  1. CCM service includes personalized support from designated clinical staff supervised by her physician, including individualized plan of care and coordination with other care providers 2. 24/7 contact phone numbers for assistance for urgent and routine care needs. 3. Service will only be billed when office clinical staff spend 20 minutes or more in a month to coordinate care. 4. Only one practitioner may furnish and bill the service in a calendar month. 5. The patient may stop CCM services at any time (effective at the end of the month) by phone call to the office staff.   Patient agreed to services and verbal consent obtained.   Follow up plan:   Carley Perdue [employer] Scheduler

## 2020-05-29 ENCOUNTER — Other Ambulatory Visit: Payer: Self-pay | Admitting: Family Medicine

## 2020-05-29 ENCOUNTER — Other Ambulatory Visit: Payer: Self-pay | Admitting: Internal Medicine

## 2020-05-31 ENCOUNTER — Emergency Department (HOSPITAL_COMMUNITY): Payer: PPO

## 2020-05-31 ENCOUNTER — Encounter (HOSPITAL_COMMUNITY): Payer: Self-pay | Admitting: Emergency Medicine

## 2020-05-31 ENCOUNTER — Emergency Department (HOSPITAL_COMMUNITY)
Admission: EM | Admit: 2020-05-31 | Discharge: 2020-05-31 | Disposition: A | Discharge status: Home / Self Care | Payer: PPO | Attending: Emergency Medicine | Admitting: Emergency Medicine

## 2020-05-31 DIAGNOSIS — W19XXXA Unspecified fall, initial encounter: Secondary | ICD-10-CM

## 2020-05-31 DIAGNOSIS — M419 Scoliosis, unspecified: Secondary | ICD-10-CM | POA: Diagnosis not present

## 2020-05-31 DIAGNOSIS — F0390 Unspecified dementia without behavioral disturbance: Secondary | ICD-10-CM | POA: Insufficient documentation

## 2020-05-31 DIAGNOSIS — R000 Tachycardia, unspecified: Secondary | ICD-10-CM | POA: Diagnosis not present

## 2020-05-31 DIAGNOSIS — S199XXA Unspecified injury of neck, initial encounter: Secondary | ICD-10-CM | POA: Diagnosis not present

## 2020-05-31 DIAGNOSIS — J449 Chronic obstructive pulmonary disease, unspecified: Secondary | ICD-10-CM | POA: Diagnosis not present

## 2020-05-31 DIAGNOSIS — M48061 Spinal stenosis, lumbar region without neurogenic claudication: Secondary | ICD-10-CM | POA: Insufficient documentation

## 2020-05-31 DIAGNOSIS — Z043 Encounter for examination and observation following other accident: Secondary | ICD-10-CM | POA: Diagnosis not present

## 2020-05-31 DIAGNOSIS — I7090 Unspecified atherosclerosis: Secondary | ICD-10-CM | POA: Diagnosis not present

## 2020-05-31 DIAGNOSIS — Z7901 Long term (current) use of anticoagulants: Secondary | ICD-10-CM | POA: Diagnosis not present

## 2020-05-31 DIAGNOSIS — G319 Degenerative disease of nervous system, unspecified: Secondary | ICD-10-CM | POA: Diagnosis not present

## 2020-05-31 DIAGNOSIS — I6782 Cerebral ischemia: Secondary | ICD-10-CM | POA: Diagnosis not present

## 2020-05-31 DIAGNOSIS — M5136 Other intervertebral disc degeneration, lumbar region: Secondary | ICD-10-CM | POA: Insufficient documentation

## 2020-05-31 DIAGNOSIS — Y999 Unspecified external cause status: Secondary | ICD-10-CM | POA: Insufficient documentation

## 2020-05-31 DIAGNOSIS — C4490 Unspecified malignant neoplasm of skin, unspecified: Secondary | ICD-10-CM | POA: Insufficient documentation

## 2020-05-31 DIAGNOSIS — W010XXA Fall on same level from slipping, tripping and stumbling without subsequent striking against object, initial encounter: Secondary | ICD-10-CM | POA: Diagnosis not present

## 2020-05-31 DIAGNOSIS — I4821 Permanent atrial fibrillation: Secondary | ICD-10-CM | POA: Insufficient documentation

## 2020-05-31 DIAGNOSIS — Y9389 Activity, other specified: Secondary | ICD-10-CM | POA: Insufficient documentation

## 2020-05-31 DIAGNOSIS — Z87891 Personal history of nicotine dependence: Secondary | ICD-10-CM | POA: Insufficient documentation

## 2020-05-31 DIAGNOSIS — R52 Pain, unspecified: Secondary | ICD-10-CM | POA: Diagnosis not present

## 2020-05-31 DIAGNOSIS — E039 Hypothyroidism, unspecified: Secondary | ICD-10-CM | POA: Insufficient documentation

## 2020-05-31 DIAGNOSIS — I517 Cardiomegaly: Secondary | ICD-10-CM | POA: Diagnosis not present

## 2020-05-31 DIAGNOSIS — Z8673 Personal history of transient ischemic attack (TIA), and cerebral infarction without residual deficits: Secondary | ICD-10-CM | POA: Insufficient documentation

## 2020-05-31 DIAGNOSIS — M47813 Spondylosis without myelopathy or radiculopathy, cervicothoracic region: Secondary | ICD-10-CM | POA: Diagnosis not present

## 2020-05-31 DIAGNOSIS — S0990XA Unspecified injury of head, initial encounter: Secondary | ICD-10-CM | POA: Insufficient documentation

## 2020-05-31 DIAGNOSIS — Y9289 Other specified places as the place of occurrence of the external cause: Secondary | ICD-10-CM | POA: Insufficient documentation

## 2020-05-31 DIAGNOSIS — I700 Atherosclerosis of aorta: Secondary | ICD-10-CM | POA: Insufficient documentation

## 2020-05-31 DIAGNOSIS — Z79890 Hormone replacement therapy (postmenopausal): Secondary | ICD-10-CM | POA: Diagnosis not present

## 2020-05-31 DIAGNOSIS — M545 Low back pain: Secondary | ICD-10-CM | POA: Diagnosis not present

## 2020-05-31 DIAGNOSIS — I10 Essential (primary) hypertension: Secondary | ICD-10-CM | POA: Diagnosis not present

## 2020-05-31 DIAGNOSIS — M4802 Spinal stenosis, cervical region: Secondary | ICD-10-CM | POA: Diagnosis not present

## 2020-05-31 DIAGNOSIS — J90 Pleural effusion, not elsewhere classified: Secondary | ICD-10-CM | POA: Diagnosis not present

## 2020-05-31 DIAGNOSIS — Z79899 Other long term (current) drug therapy: Secondary | ICD-10-CM | POA: Insufficient documentation

## 2020-05-31 DIAGNOSIS — I5032 Chronic diastolic (congestive) heart failure: Secondary | ICD-10-CM | POA: Diagnosis not present

## 2020-05-31 DIAGNOSIS — T1490XA Injury, unspecified, initial encounter: Secondary | ICD-10-CM

## 2020-05-31 DIAGNOSIS — M47816 Spondylosis without myelopathy or radiculopathy, lumbar region: Secondary | ICD-10-CM | POA: Diagnosis not present

## 2020-05-31 DIAGNOSIS — I110 Hypertensive heart disease with heart failure: Secondary | ICD-10-CM | POA: Diagnosis not present

## 2020-05-31 DIAGNOSIS — S3992XA Unspecified injury of lower back, initial encounter: Secondary | ICD-10-CM | POA: Diagnosis not present

## 2020-05-31 DIAGNOSIS — M47812 Spondylosis without myelopathy or radiculopathy, cervical region: Secondary | ICD-10-CM | POA: Diagnosis not present

## 2020-05-31 DIAGNOSIS — Z8249 Family history of ischemic heart disease and other diseases of the circulatory system: Secondary | ICD-10-CM | POA: Insufficient documentation

## 2020-05-31 DIAGNOSIS — N3289 Other specified disorders of bladder: Secondary | ICD-10-CM | POA: Diagnosis not present

## 2020-05-31 DIAGNOSIS — G9389 Other specified disorders of brain: Secondary | ICD-10-CM | POA: Diagnosis not present

## 2020-05-31 DIAGNOSIS — G8929 Other chronic pain: Secondary | ICD-10-CM | POA: Insufficient documentation

## 2020-05-31 DIAGNOSIS — M2578 Osteophyte, vertebrae: Secondary | ICD-10-CM | POA: Diagnosis not present

## 2020-05-31 LAB — COMPREHENSIVE METABOLIC PANEL
ALT: 10 U/L (ref 0–44)
AST: 21 U/L (ref 15–41)
Albumin: 3.4 g/dL — ABNORMAL LOW (ref 3.5–5.0)
Alkaline Phosphatase: 67 U/L (ref 38–126)
Anion gap: 7 (ref 5–15)
BUN: 18 mg/dL (ref 8–23)
CO2: 25 mmol/L (ref 22–32)
Calcium: 9.5 mg/dL (ref 8.9–10.3)
Chloride: 105 mmol/L (ref 98–111)
Creatinine, Ser: 0.73 mg/dL (ref 0.44–1.00)
GFR calc Af Amer: >60 mL/min (ref >60)
GFR calc non Af Amer: >60 mL/min (ref >60)
Glucose, Bld: 127 mg/dL — ABNORMAL HIGH (ref 70–99)
Potassium: 4.7 mmol/L (ref 3.5–5.1)
Sodium: 137 mmol/L (ref 135–145)
Total Bilirubin: 0.4 mg/dL (ref 0.3–1.2)
Total Protein: 7.2 g/dL (ref 6.5–8.1)

## 2020-05-31 LAB — URINALYSIS, ROUTINE W REFLEX MICROSCOPIC
Bilirubin Urine: NEGATIVE
Glucose, UA: NEGATIVE mg/dL
Hgb urine dipstick: NEGATIVE
Ketones, ur: NEGATIVE mg/dL
Nitrite: NEGATIVE
Protein, ur: NEGATIVE mg/dL
Specific Gravity, Urine: 1.029 (ref 1.005–1.030)
pH: 7 (ref 5.0–8.0)

## 2020-05-31 LAB — CBC WITH DIFFERENTIAL/PLATELET
Abs Immature Granulocytes: 0.03 10*3/uL (ref 0.00–0.07)
Basophils Absolute: 0.1 10*3/uL (ref 0.0–0.1)
Basophils Relative: 1 %
Eosinophils Absolute: 0.1 10*3/uL (ref 0.0–0.5)
Eosinophils Relative: 2 %
HCT: 39.8 % (ref 36.0–46.0)
Hemoglobin: 12.7 g/dL (ref 12.0–15.0)
Immature Granulocytes: 0 %
Lymphocytes Relative: 19 %
Lymphs Abs: 1.4 10*3/uL (ref 0.7–4.0)
MCH: 33.5 pg (ref 26.0–34.0)
MCHC: 31.9 g/dL (ref 30.0–36.0)
MCV: 105 fL — ABNORMAL HIGH (ref 80.0–100.0)
Monocytes Absolute: 0.6 10*3/uL (ref 0.1–1.0)
Monocytes Relative: 7 %
Neutro Abs: 5.4 10*3/uL (ref 1.7–7.7)
Neutrophils Relative %: 71 %
Platelets: 233 10*3/uL (ref 150–400)
RBC: 3.79 MIL/uL — ABNORMAL LOW (ref 3.87–5.11)
RDW: 13.2 % (ref 11.5–15.5)
WBC: 7.5 10*3/uL (ref 4.0–10.5)
nRBC: 0 % (ref 0.0–0.2)

## 2020-05-31 LAB — CK: Total CK: 56 U/L (ref 38–234)

## 2020-05-31 MED ORDER — IOHEXOL 300 MG/ML  SOLN
100.0000 mL | Freq: Once | INTRAMUSCULAR | Status: AC | PRN
  Administered 2020-05-31: 100 mL via INTRAVENOUS

## 2020-05-31 MED ORDER — ACETAMINOPHEN 325 MG PO TABS
650.0000 mg | ORAL_TABLET | Freq: Once | ORAL | Status: AC
  Administered 2020-05-31: 650 mg via ORAL
  Filled 2020-05-31: qty 2

## 2020-05-31 MED ORDER — MORPHINE SULFATE (PF) 2 MG/ML IV SOLN
2.0000 mg | Freq: Once | INTRAVENOUS | Status: AC
  Administered 2020-05-31: 2 mg via INTRAVENOUS
  Filled 2020-05-31: qty 1

## 2020-05-31 MED ORDER — ONDANSETRON HCL 4 MG/2ML IJ SOLN
4.0000 mg | Freq: Once | INTRAMUSCULAR | Status: AC
  Administered 2020-05-31: 4 mg via INTRAVENOUS
  Filled 2020-05-31: qty 2

## 2020-05-31 NOTE — Discharge Instructions (Addendum)
Your blood work today was normal. Urine did not show infection.  The x-rays and CTs did not show any broken bones or traumatic injuries.  You might feel sore for the next couple of days or have headaches as you likely have a concussion. You should take Tylenol as needed for pain.  Return to the emergency department for any new or worsening symptoms including change in mental status or change in behavior, visual changes.

## 2020-05-31 NOTE — ED Notes (Signed)
Ambulated pt down hallway and back to bed

## 2020-05-31 NOTE — ED Provider Notes (Signed)
[facility] EMERGENCY DEPARTMENT Provider Note   CSN: [id number] Arrival date & time: 05/31/20  1356     History Chief Complaint  Patient presents with  . Fall    [name] is a 86 y.o. female.  HPI   86 year old female with a history of aortic stenosis, blood clotting disorder, bradycardia, carotid stenosis, chronic A. fib (on anticoagulation), diverticular old low this, frequent UTI, history of CVA, osteoporosis, who presents to the emergency department today for evaluation after a fall.  Additional history was obtained from the patient's daughter Lynelle Smoke and Marliss Czar who states that they have cameras installed in the patient's apartment and were able to catch the fall on camera.  They state that the patient was walking in her apartment with her cane, lost her balance and fell backwards hitting her head on a dresser and then falling onto her side.  They are not sure if she lost consciousness but she was on the floor several minutes.  They state that she does have a history of some memory problems and intermittently gets confused.  She is currently anticoagulated.  Her daughter tells me that she has chronic lower back pain due to scoliosis and that she frequently does complaining of lower back pain.  They state that they feel the patient is being managed well at home as she has nursing assistance to come to her house twice daily.  They are also able to monitor her on cameras in the apartment to make sure that she is safe.  There is a level 5 caveat due to patient's history of dementia.  She is unable to tell me details surrounding the fall and tells me that she fell out of bed.  She is mainly complaining of lower back pain.  She denies any other complaints at this time.  Past Medical History:  Diagnosis Date  . Aortic stenosis    a. mild on echo 10/2013  . Blood clotting disorder ([place])   . Bradycardia    a. Holter (1/15):  avg HR 78, 1.4% PVCs, several pauses </= 3  sec, b.  Holter (01/2014):  AFib, freq PVCs, HR 51-127, Avg HR 88, longest pause 2.9 sec - no changes made  . Carotid stenosis    a. Carotid US (4/14):  RICA 40-59%;  b. Carotid US (01/2014) bilateral 1-39%  . Chronic atrial fibrillation (HCC)    a. permanent;  b. chronic coumadin;  c. 10/2013 digoxin and atenolol d/c'd 2/2 pauses of <3 sec;  d. 01/2014 digoxin resumed @ 0.125 mg in setting of difficult to control rate and diast chf.  . Chronic diastolic heart failure ([place])    a. Echo (6/08):  EF 55-60%, mild MR;  b. Echo (10/2013):  EF 55%, mild AS  . Contact dermatitis and other eczema, due to unspecified cause    a. h/o myalgias with atorvastatin.  . Diverticulosis of colon (without mention of hemorrhage)   . DJD (degenerative joint disease)   . Frequent UTI    Dr. Terance Hart sees pt  . Gout, unspecified   . H/O: CVA (cardiovascular accident) 2008  . History of CVA (cerebrovascular accident)    2008  . History of melanoma    on right ear  . Hyperlipidemia   . Hypertension    a. hx of edema with Amlodipine.  . Hypothyroidism   . Lumbago    chronic low back pain  . Osteoporosis    las DEXA on 01/30/10  . Skin cancer  forehead  . Urticaria, unspecified     Patient Active Problem List   Diagnosis Date Noted  . Pressure injury of sacral region, stage 1 06/16/2019  . COPD (chronic obstructive pulmonary disease) with emphysema ([place]) 11/22/2017  . Encounter for therapeutic drug monitoring 11/03/2013  . Chronic diastolic CHF (congestive heart failure) ([place]) 10/10/2013  . Personal history of colonic polyps 10/14/2011  . Anemia 10/07/2011  . Long term (current) use of anticoagulants 06/03/2011  . Internal hemorrhoids without mention of complication 13/05/6577  . OSTEOPOROSIS 08/06/2010  . Carotid Stenosis 05/06/2010  . CEREBRAL EMBOLISM WITH CEREBRAL INFARCTION 05/28/2009  . ECZEMA 10/29/2008  . UTI'S, RECURRENT 08/28/2008  . LOW BACK PAIN, CHRONIC 01/31/2008  . LEG PAIN, BILATERAL  01/09/2008  . Hypothyroidism 10/18/2007  . Dyslipidemia 10/18/2007  . GOUT 10/18/2007  . Essential hypertension 10/18/2007  . Permanent atrial fibrillation ([place]) 10/18/2007  . DIVERTICULOSIS, COLON 10/18/2007  . Osteoarthritis 10/18/2007    Past Surgical History:  Procedure Laterality Date  . ABDOMINAL HYSTERECTOMY    . BAND HEMORRHOIDECTOMY  last on 10-28-11   x 2, per Dr. Deatra Ina   . COLONOSCOPY  07-02-11   Dr. Deatra Ina, adenomatous polyp, no repeats are planned   . FLEXIBLE SIGMOIDOSCOPY  10/28/2011   Procedure: FLEXIBLE SIGMOIDOSCOPY;  Surgeon: Inda Castle, MD;  Location: [facility] END[facility]COPY;  Service: Endoscopy;  Laterality: N/A;  . GASTRIC VARICES BANDING  10/28/2011   Procedure: HEMORRHOID BANDING;  Surgeon: Inda Castle, MD;  Location: [facility] ENDOSCOPY;  Service: Endoscopy;  Laterality: N/A;  . SKIN CANCER EXCISION  2001   removed right ear per Dr. Denna Haggard, skin grafts harvested from right cheek  . TONSILLECTOMY       OB History   No obstetric history on file.     Family History  Problem Relation Age of Onset  . Colon polyps Mother   . Hypertension Mother   . Stroke Father   . Coronary artery disease Other        sister  . COPD Other        sister  . Immunodeficiency Other        sister  . Anemia Other        sister  . Anemia Sister   . Colon cancer Neg Hx   . Heart attack Neg Hx     Social History   Tobacco Use  . Smoking status: Former Smoker    Quit date: 10/05/1994    Years since quitting: 25.6  . Smokeless tobacco: Never Used  Vaping Use  . Vaping Use: Never used  Substance Use Topics  . Alcohol use: No    Alcohol/week: 0.0 standard drinks  . Drug use: No    Home Medications Prior to Admission medications   Medication Sig Start Date End Date Taking? Authorizing Provider  allopurinol (ZYLOPRIM) 100 MG tablet Take 100 mg by mouth daily.    [provider]  cephALEXin (KEFLEX) 500 MG capsule TAKE 1 CAPSULE (500 MG TOTAL) BY MOUTH DAILY. 05/29/20    Laurey Morale, MD  diltiazem (CARDIZEM CD) 240 MG 24 hr capsule TAKE 1 CAPSULE BY MOUTH EVERY DAY 05/16/20   Fay Records, MD  ELIQUIS 2.5 MG TABS tablet TAKE 1 TABLET BY MOUTH TWICE A DAY 01/05/20   Fay Records, MD  furosemide (LASIX) 20 MG tablet TAKE 1 TABLET BY MOUTH EVERY OTHER DAY 04/23/20   Fay Records, MD  KLOR-CON M10 10 MEQ tablet TAKE 1 TABLET BY MOUTH  2 TIMES DAILY. PLEASE MAKE OVERDUE APPT WITH DR. ROSS BEFORE ANYMORE REFILLS 05/29/20   Fay Records, MD  levothyroxine (SYNTHROID) 50 MCG tablet Take 1 tablet (50 mcg total) by mouth daily. 07/18/19   Laurey Morale, MD  metoprolol succinate (TOPROL-XL) 50 MG 24 hr tablet TAKE 1 TABLET BY MOUTH TWICE A DAY Patient taking differently: Take 50 mg by mouth in the morning and at bedtime.  07/12/19   [name] T, PA-C  nystatin cream (MYCOSTATIN) Apply 1 application topically 2 (two) times daily. 01/01/20   Laurey Morale, MD  pravastatin (PRAVACHOL) 10 MG tablet TAKE 1 TABLET BY MOUTH 3 TIMES A WEEK, MONDAYS, WEDNESDAYS, AND FRIDAYS Patient taking differently: Take 10 mg by mouth every Monday, Wednesday, and Friday.  11/21/19   Laurey Morale, MD  Probiotic Product (ALIGN PO) Take 1 capsule by mouth at bedtime.     [provider]    Allergies    Amlodipine, Amoxicillin, Hydrocodone-acetaminophen, Nitrofurantoin, and Sulfonamide derivatives  Review of Systems   Review of Systems  Constitutional: Negative for fever.  Respiratory: Negative for cough and shortness of breath.   Cardiovascular: Negative for chest pain.  Genitourinary: Negative for flank pain.  Musculoskeletal: Positive for back pain. Negative for neck pain.  Skin: Negative for wound.  Neurological:       Head injury    Physical Exam Updated Vital Signs BP 117/87 (BP Location: Right Arm)   Pulse 88   Temp 97.9 F (36.6 C) (Oral)   Resp 20   SpO2 99%   Physical Exam Vitals and nursing note reviewed.  Constitutional:      General: She is not in  acute distress.    Appearance: She is well-developed.  HENT:     Head: Normocephalic.     Comments: 1.5 centimeter hematoma to the left forehead Eyes:     Conjunctiva/sclera: Conjunctivae normal.  Cardiovascular:     Rate and Rhythm: Normal rate and regular rhythm.     Heart sounds: No murmur heard.   Pulmonary:     Effort: Pulmonary effort is normal. No respiratory distress.     Breath sounds: Normal breath sounds.  Abdominal:     General: Bowel sounds are normal.     Palpations: Abdomen is soft.     Tenderness: There is no abdominal tenderness.  Musculoskeletal:     Cervical back: Neck supple.     Comments: In c-collar in c-collar.  No TTP to the C, T, L-spine.  Skin:    General: Skin is warm and dry.  Neurological:     Mental Status: She is alert.     Comments: Mental Status:  Alert, oriented to self, but not date, time, situation (baseline per family). Speech fluent without evidence of aphasia. Able to follow 2 step commands without difficulty.  Cranial Nerves:  II:  pupils equal, round, reactive to light III,IV, VI: ptosis not present, extra-ocular motions intact bilaterally  V,VII: smile symmetric, facial light touch sensation equal VIII: hearing grossly normal to voice  X: uvula elevates symmetrically  XI: bilateral shoulder shrug symmetric and strong XII: midline tongue extension without fassiculations Motor:  Normal tone. 5/5 strength of BUE and BLE major muscle groups including strong and equal grip strength and dorsiflexion/plantar flexion Sensory: light touch normal in all extremities.      ED Results / Procedures / Treatments   Labs (all labs ordered are listed, but only abnormal results are displayed) Labs Reviewed  CBC WITH  DIFFERENTIAL/PLATELET - Abnormal; Notable for the following components:      Result Value   RBC 3.79 (*)    MCV 105.0 (*)    All other components within normal limits  COMPREHENSIVE METABOLIC PANEL - Abnormal; Notable for the  following components:   Glucose, Bld 127 (*)    Albumin 3.4 (*)    All other components within normal limits  CK  URINALYSIS, ROUTINE W REFLEX MICROSCOPIC    EKG None  Radiology DG Pelvis Portable  Result Date: 05/31/2020 CLINICAL DATA:  Fall. EXAM: PORTABLE PELVIS 1-2 VIEWS COMPARISON:  CT 12/10/2019 FINDINGS: No visible pelvic fracture. Chronic curvature and degenerative changes of the lumbar spine. IMPRESSION: No acute finding. Chronic curvature and degenerative changes of the spine. Electronically Signed   By: Nelson Chimes M.D.   On: 05/31/2020 14:58   DG Chest Port 1 View  Result Date: 05/31/2020 CLINICAL DATA:  Fall EXAM: PORTABLE CHEST 1 VIEW COMPARISON:  11/09/2017 FINDINGS: Chronic cardiomegaly and aortic atherosclerosis. Pulmonary venous hypertension without frank edema. No infiltrate, collapse or effusion. No traumatic chest region finding. IMPRESSION: Cardiomegaly and aortic atherosclerosis. Pulmonary venous hypertension without frank edema. Electronically Signed   By: Nelson Chimes M.D.   On: 05/31/2020 14:56    Procedures Procedures (including critical care time)  Medications Ordered in ED Medications  acetaminophen (TYLENOL) tablet 650 mg (has no administration in time range)    ED Course  I have reviewed the triage vital signs and the nursing notes.  Pertinent labs & imaging results that were available during my care of the patient were reviewed by me and considered in my medical decision making (see chart for details).    MDM Rules/Calculators/A&P                          86 year old female presenting for evaluation of fall.  Family states patient was walking in her apartment when she fell backwards hitting her head on a dresser.  She is anticoagulated.  Reviewed/interpreted labs which are reassuring.  At shift change, patient pending urinalysis and remainder of imaging studies.  Care transition to [employer], PA-C with plan to follow-up on pending  imaging studies.  If negative to patient appropriate for discharge home pending ambulation.   Final Clinical Impression(s) / ED Diagnoses Final diagnoses:  Trauma  Fall, initial encounter  Injury of head, initial encounter    Rx / DC Orders ED Discharge Orders    None       Bishop Dublin 05/31/20 1527    Quintella Reichert, MD 05/31/20 1601

## 2020-05-31 NOTE — ED Provider Notes (Signed)
Care assumed from C. Couture PA-C at shift change pending imaging, UA, ambulation trial.  See her note for full H&P.    Briefly this is 86 yo female with chronic Afib on eliquis presenting after mechanical fall witnessed on camera by her daughters.  Previous provider has already spoken with family. Will call and update Tami [phone] after imaging. Patient has had chronic low back pain, unsure what is new today as patient cannot provide history   Xray of chest and pelvis without acute traumatic findings. CK normal. CBC and CMP overall unremarkable.  Physical Exam  BP 117/87 (BP Location: Right Arm)   Pulse 88   Temp 97.9 F (36.6 C) (Oral)   Resp 20   SpO2 99%   Physical Exam  PE: Constitutional: well-developed, well-nourished, no apparent distress HENT: 1.5 cm hematoma on left forehead. Cervical collar in place. Cardiovascular: normal rate and rhythm, distal pulses intact Pulmonary/Chest: effort normal; breath sounds clear and equal bilaterally; no wheezes or rales Abdominal: soft and nontender Musculoskeletal: no thoracic and lumbar tenderness. No bony step off. Neurological: pleasantly demented. Lower extremities strength 5/5 bilaterally, sensation intact. Skin: warm and dry, no rash, no diaphoresis Psychiatric: normal mood and affect, normal behavior    ED Course/Procedures     Results for orders placed or performed during the hospital encounter of 05/31/20  CBC with Differential  Result Value Ref Range   WBC 7.5 4.0 - 10.5 K/uL   RBC 3.79 (L) 3.87 - 5.11 MIL/uL   Hemoglobin 12.7 12.0 - 15.0 g/dL   HCT 39.8 36 - 46 %   MCV 105.0 (H) 80.0 - 100.0 fL   MCH 33.5 26.0 - 34.0 pg   MCHC 31.9 30.0 - 36.0 g/dL   RDW 13.2 11.5 - 15.5 %   Platelets 233 150 - 400 K/uL   nRBC 0.0 0.0 - 0.2 %   Neutrophils Relative % 71 %   Neutro Abs 5.4 1.7 - 7.7 K/uL   Lymphocytes Relative 19 %   Lymphs Abs 1.4 0.7 - 4.0 K/uL   Monocytes Relative 7 %   Monocytes Absolute 0.6 0 - 1 K/uL    Eosinophils Relative 2 %   Eosinophils Absolute 0.1 0 - 0 K/uL   Basophils Relative 1 %   Basophils Absolute 0.1 0 - 0 K/uL   Immature Granulocytes 0 %   Abs Immature Granulocytes 0.03 0.00 - 0.07 K/uL  Urinalysis, Routine w reflex microscopic Urine, Clean Catch  Result Value Ref Range   Color, Urine YELLOW YELLOW   APPearance CLEAR CLEAR   Specific Gravity, Urine 1.029 1.005 - 1.030   pH 7.0 5.0 - 8.0   Glucose, UA NEGATIVE NEGATIVE mg/dL   Hgb urine dipstick NEGATIVE NEGATIVE   Bilirubin Urine NEGATIVE NEGATIVE   Ketones, ur NEGATIVE NEGATIVE mg/dL   Protein, ur NEGATIVE NEGATIVE mg/dL   Nitrite NEGATIVE NEGATIVE   Leukocytes,Ua MODERATE (A) NEGATIVE   RBC / HPF 0-5 0 - 5 RBC/hpf   WBC, UA 0-5 0 - 5 WBC/hpf   Bacteria, UA RARE (A) NONE SEEN   Squamous Epithelial / LPF 0-5 0 - 5  Comprehensive metabolic panel  Result Value Ref Range   Sodium 137 135 - 145 mmol/L   Potassium 4.7 3.5 - 5.1 mmol/L   Chloride 105 98 - 111 mmol/L   CO2 25 22 - 32 mmol/L   Glucose, Bld 127 (H) 70 - 99 mg/dL   BUN 18 8 - 23 mg/dL   Creatinine, Ser  0.73 0.44 - 1.00 mg/dL   Calcium 9.5 8.9 - 10.3 mg/dL   Total Protein 7.2 6.5 - 8.1 g/dL   Albumin 3.4 (L) 3.5 - 5.0 g/dL   AST 21 15 - 41 U/L   ALT 10 0 - 44 U/L   Alkaline Phosphatase 67 38 - 126 U/L   Total Bilirubin 0.4 0.3 - 1.2 mg/dL   GFR calc non Af Amer >60 >60 mL/min   GFR calc Af Amer >60 >60 mL/min   Anion gap 7 5 - 15  CK  Result Value Ref Range   Total CK 56 38.0 - 234.0 U/L   CT Head Wo Contrast  Result Date: 05/31/2020 CLINICAL DATA:  86 year old post fall striking head. On anticoagulation. EXAM: CT HEAD WITHOUT CONTRAST TECHNIQUE: Contiguous axial images were obtained from the base of the skull through the vertex without intravenous contrast. COMPARISON:  Head CT 12/10/2019 FINDINGS: Brain: No acute intracranial hemorrhage. Stable degree of atrophy. Stable degree of chronic small vessel ischemia. Remote right MCA infarct with  encephalomalacia. Remote lacunar infarct in the left basal ganglia. No subdural or extra-axial collection. No hydrocephalus basilar cisterns are patent. Vascular: Atherosclerosis of skullbase vasculature without hyperdense vessel or abnormal calcification. Skull: No fracture or focal lesion. Sinuses/Orbits: Paranasal sinuses and mastoid air cells are clear. The visualized orbits are unremarkable. Bilateral cataract resection. Other: None. IMPRESSION: 1. No acute intracranial abnormality. No skull fracture. 2. Stable atrophy, chronic small vessel ischemia, and remote right MCA infarct. Electronically Signed   By: Keith Rake M.D.   On: 05/31/2020 16:36   CT Cervical Spine Wo Contrast  Result Date: 05/31/2020 CLINICAL DATA:  Trauma. EXAM: CT CERVICAL SPINE WITHOUT CONTRAST TECHNIQUE: Multidetector CT imaging of the cervical spine was performed without intravenous contrast. Multiplanar CT image reconstructions were also generated. COMPARISON:  None. FINDINGS: Alignment: No malalignment. Skull base and vertebrae: No evidence of regional fracture. Soft tissues and spinal canal: No traumatic soft tissue finding. Disc levels: Foramen magnum widely patent. Ordinary osteoarthritis at the skull base C1 and C1-C2 articulations. No encroachment upon the neural structures. C2-3: Spondylosis. Facet osteoarthritis worse on the left. No compressive stenosis. C3-4: Spondylosis. Facet osteoarthritis worse on the left. Mild left foraminal stenosis. C4-5: Spondylosis with chronic calcified central disc herniation. Facet osteoarthritis. Stenosis of the central canal that could affect the cord. Mild bilateral foraminal stenosis. C5-6: Spondylosis.  Bilateral bony foraminal narrowing. C6-7: Spondylosis.  Bony foraminal narrowing on the left. C7-T1: Spondylosis.  Canal and foramina sufficiently patent. Upper thoracic region: No significant finding. Upper chest: Mild scarring.  No active process. Other: None IMPRESSION: No acute or  traumatic finding. Chronic degenerative spondylosis and facet arthropathy as outlined above. Central canal stenosis at C4-5 that could affect the cord. Electronically Signed   By: Nelson Chimes M.D.   On: 05/31/2020 16:39   CT ABDOMEN PELVIS W CONTRAST  Result Date: 05/31/2020 CLINICAL DATA:  Trip and fall with abdominal pain, initial encounter EXAM: CT ABDOMEN AND PELVIS WITH CONTRAST TECHNIQUE: Multidetector CT imaging of the abdomen and pelvis was performed using the standard protocol following bolus administration of intravenous contrast. CONTRAST:  165mL OMNIPAQUE IOHEXOL 300 MG/ML  SOLN COMPARISON:  02/21/2008 FINDINGS: Lower chest: No acute abnormality. Heart is mildly enlarged in size. Hepatobiliary: Fatty infiltration of the liver is noted. The gallbladder is within normal limits. Mild prominence of the common bile duct is noted without intrahepatic ductal dilatation. Given the patient's age this is felt to be within normal limits. Pancreas:  Unremarkable. No pancreatic ductal dilatation or surrounding inflammatory changes. Spleen: Normal in size without focal abnormality. Adrenals/Urinary Tract: Adrenal glands are within normal limits bilaterally. Kidneys demonstrate a normal enhancement pattern. No renal calculi or obstructive changes are seen. The bladder is well distended. Stomach/Bowel: Scattered diverticular changes noted throughout the colon without evidence of diverticulitis. The appendix is well visualized and within normal limits. No inflammatory changes are seen. Small bowel and stomach appear within normal limits. Vascular/Lymphatic: Atherosclerotic calcifications are noted without aneurysmal dilatation. No sizable lymphadenopathy is seen. Reproductive: Status post hysterectomy. No adnexal masses. Other: No abdominal wall hernia or abnormality. No abdominopelvic ascites. Musculoskeletal: Degenerative changes of lumbar spine are noted. Scoliosis is noted similar to that seen on previous exams.  No acute bony abnormality is noted. No soft tissue hematoma is seen. IMPRESSION: Chronic changes as described above without acute abnormality. Electronically Signed   By: Inez Catalina M.D.   On: 05/31/2020 17:27   DG Pelvis Portable  Result Date: 05/31/2020 CLINICAL DATA:  Fall. EXAM: PORTABLE PELVIS 1-2 VIEWS COMPARISON:  CT 12/10/2019 FINDINGS: No visible pelvic fracture. Chronic curvature and degenerative changes of the lumbar spine. IMPRESSION: No acute finding. Chronic curvature and degenerative changes of the spine. Electronically Signed   By: Nelson Chimes M.D.   On: 05/31/2020 14:58   CT L-SPINE NO CHARGE  Result Date: 05/31/2020 CLINICAL DATA:  Trauma EXAM: CT LUMBAR SPINE WITHOUT CONTRAST TECHNIQUE: Multidetector CT imaging of the lumbar spine was performed without intravenous contrast administration. Multiplanar CT image reconstructions were also generated. COMPARISON:  01/13/2008 FINDINGS: Segmentation: 5 lumbar type vertebral bodies. Alignment: Thoracolumbar curvature convex to the right. Lumbar curvature convex to the left. Rightward translation of L1 relative to L2 and L2 relative to L3. Leftward translation of L4 relative to L5. Vertebrae: No evidence of regional acute fracture or traumatic malalignment. Paraspinal and other soft tissues: See results of CT abdomen. Disc levels: L1-2: Disc degeneration with endplate osteophytes. No compressive stenosis. L2-3: Disc degeneration with near complete loss of height. Facet degeneration on the right. Narrowing of the right lateral recess and intervertebral foramen on the right. L3-4: Chronic fusion.  Sufficient patency of the canal and foramina. L4-5: Advanced disc degeneration with loss of disc height and vacuum phenomenon. Endplate sclerosis and osteophyte formation. Facet degeneration and hypertrophy. Bilateral facet hypertrophy. Stenosis of the left lateral recess and intervertebral foramen on the left that could cause left-sided neural  compression. L5-S1: No significant disc pathology. Ordinary facet osteoarthritis. No compressive stenosis. IMPRESSION: No acute or traumatic finding. Chronic scoliosis as above. Chronic fusion at L3-4. Advanced chronic degenerative disc disease and degenerative facet disease throughout the lumbar region as outlined above. Narrowing of the lateral recess and intervertebral foramen on the right at L2-3 and on the left at L4-5 that could cause neural compression. Electronically Signed   By: Nelson Chimes M.D.   On: 05/31/2020 16:36   DG Chest Port 1 View  Result Date: 05/31/2020 CLINICAL DATA:  Fall EXAM: PORTABLE CHEST 1 VIEW COMPARISON:  11/09/2017 FINDINGS: Chronic cardiomegaly and aortic atherosclerosis. Pulmonary venous hypertension without frank edema. No infiltrate, collapse or effusion. No traumatic chest region finding. IMPRESSION: Cardiomegaly and aortic atherosclerosis. Pulmonary venous hypertension without frank edema. Electronically Signed   By: Nelson Chimes M.D.   On: 05/31/2020 14:56     MDM  Patient received in sign out at 3PM. Please see previous provider note to include MDM up to this point.   Patient related with standby assist.  She had no difficulties with ambulation.  Gait was steady.  UA is negative for infection. I viewed all images. Imaging is all negative for acute traumatic findings. Discussed results with patient and daughter [name] who is at the bedside. They are agreeable with plan to discharge home and follow up with pcp for symptom recheck.  The patient appears reasonably screened and/or stabilized for discharge and I doubt any other medical condition or other [facility] requiring further screening, evaluation, or treatment in the ED at this time prior to discharge. The patient is safe for discharge with strict return precautions discussed.    Portions of this note were generated with [employer]. Dictation errors may occur despite best attempts at proofreading.     Flint Melter 05/31/20 1820    Isla Pence, MD 05/31/20 Tresa Moore

## 2020-05-31 NOTE — Progress Notes (Signed)
Orthopedic Tech Progress Note Patient Details:  [name] [birth year] [id number] LEVEL 2 TRAUMA Patient ID: [name], female   DOB: [birth year], 86 y.o.   MRN: [id number]   Janit Pagan 05/31/2020, 3:42 PM

## 2020-05-31 NOTE — ED Triage Notes (Signed)
Pt here from home after a trip and fall at home , c/o lower back pain , ,may have grazed her head , pt is on blood thinners

## 2020-06-01 ENCOUNTER — Other Ambulatory Visit: Payer: Self-pay | Admitting: Physician Assistant

## 2020-06-01 DIAGNOSIS — I482 Chronic atrial fibrillation, unspecified: Secondary | ICD-10-CM

## 2020-06-01 DIAGNOSIS — I10 Essential (primary) hypertension: Secondary | ICD-10-CM

## 2020-06-08 ENCOUNTER — Other Ambulatory Visit: Payer: Self-pay | Admitting: Internal Medicine

## 2020-06-11 ENCOUNTER — Other Ambulatory Visit: Payer: Self-pay | Admitting: Internal Medicine

## 2020-06-11 DIAGNOSIS — M25531 Pain in right wrist: Secondary | ICD-10-CM | POA: Diagnosis not present

## 2020-06-11 DIAGNOSIS — S52531A Colles' fracture of right radius, initial encounter for closed fracture: Secondary | ICD-10-CM | POA: Diagnosis not present

## 2020-06-13 ENCOUNTER — Encounter: Payer: Self-pay | Admitting: Family Medicine

## 2020-06-13 ENCOUNTER — Telehealth: Payer: Self-pay | Admitting: Family Medicine

## 2020-06-13 ENCOUNTER — Other Ambulatory Visit: Payer: Self-pay

## 2020-06-13 ENCOUNTER — Ambulatory Visit (INDEPENDENT_AMBULATORY_CARE_PROVIDER_SITE_OTHER): Payer: PPO | Admitting: Family Medicine

## 2020-06-13 VITALS — BP 124/80 | HR 97 | Ht 61.0 in

## 2020-06-13 DIAGNOSIS — S62101D Fracture of unspecified carpal bone, right wrist, subsequent encounter for fracture with routine healing: Secondary | ICD-10-CM | POA: Diagnosis not present

## 2020-06-13 DIAGNOSIS — Z23 Encounter for immunization: Secondary | ICD-10-CM | POA: Diagnosis not present

## 2020-06-13 DIAGNOSIS — R413 Other amnesia: Secondary | ICD-10-CM | POA: Diagnosis not present

## 2020-06-13 DIAGNOSIS — R296 Repeated falls: Secondary | ICD-10-CM | POA: Insufficient documentation

## 2020-06-13 MED ORDER — POTASSIUM CHLORIDE CRYS ER 10 MEQ PO TBCR
EXTENDED_RELEASE_TABLET | ORAL | 11 refills | Status: DC
Start: 2020-06-13 — End: 2020-07-31

## 2020-06-13 NOTE — Telephone Encounter (Signed)
Patient's daughter called to see if there is anything that Dr. Sarajane Jews can call into the pharmacy  [id number] [address], [place] Phone:  [phone]  Fax:  [phone]     For the patient to use on her butt on the rash that is painful instead of using over the counter Lidocane because it is not strong enough.  Please advise

## 2020-06-13 NOTE — Progress Notes (Signed)
   Subjective:    Patient ID: [name], female    DOB: 25-Jul-1926, 86 y.o.   MRN: [id number]  HPI Here with her daughter [name] to follow up a visit to the ER on 05-31-20 and then a visit to urgent care on 06-10-20 both for injuries from falls. She lives by herself in an assisted living facility and the family has installed security cameras in her apartment. Her daughter shows me videos of each incident, and both times [name] was walking with her cane and simply lost her balance. The first time she fell backwards and struck her head on the edge of a dresser. There was no LOC apparently, but she does not have clear memories of this. At the ER CT scans were taken of the head, neck, and abdomen, and nothing significant was found. She was sent back home after an overnight stay. The second fall was when she fell backwards and reached her right arm behind her body to catch herself. This time she was taken to [facility] urgent care, and Xrays revealed a small fracture to the right wrist. She was given a splint, and she is scheduled to see Dr. Iran Planas about this on 07-02-20. She feels good today and denies any pain. The family is concerned about her safety because she is becoming more frail and more confused all the time.    Review of Systems  Constitutional: Negative.   Respiratory: Negative.  Negative for shortness of breath.   Cardiovascular: Negative.   Gastrointestinal: Negative.   Genitourinary: Negative.  Negative for genital sores.  Neurological: Positive for weakness.       Objective:   Physical Exam Constitutional:      Comments: Frail, in a wheeklchir  HENT:     Head: Normocephalic and atraumatic.  Cardiovascular:     Rate and Rhythm: Normal rate and regular rhythm.     Pulses: Normal pulses.     Heart sounds: Normal heart sounds.  Pulmonary:     Effort: Pulmonary effort is normal.     Breath sounds: Normal breath sounds.  Musculoskeletal:     Comments: Wearing a  splint on the right wrist   Neurological:     General: No focal deficit present.     Mental Status: She is oriented to person, place, and time.           Assessment & Plan:  She has a wrist fracture and she will see Orthopedics as above. She is becoming more frail and confused, and I gave her daughter a rx to get a walker for her to use around the home rather than a cane. I also suggested they begin looking for a skilled nursing facility to move her to that has a memory unit.  Alysia Penna, MD

## 2020-06-14 DIAGNOSIS — I6340 Cerebral infarction due to embolism of unspecified cerebral artery: Secondary | ICD-10-CM | POA: Diagnosis not present

## 2020-06-14 MED ORDER — TRIAMCINOLONE ACETONIDE 0.1 % EX CREA: 1.0000 "application " | TOPICAL_CREAM | Freq: Two times a day (BID) | CUTANEOUS | 2 refills | Status: AC

## 2020-06-14 NOTE — Telephone Encounter (Signed)
Rx sent in, pt's daughter aware.

## 2020-06-14 NOTE — Addendum Note (Signed)
Addended by: Rodrigo Ran on: 06/14/2020 03:12 PM   Modules accepted: Orders

## 2020-06-14 NOTE — Telephone Encounter (Signed)
Call in Triamcinolone 0.1% cream to apply BID as needed, 45 gm with 2 rf

## 2020-06-24 ENCOUNTER — Other Ambulatory Visit: Payer: Self-pay | Admitting: Internal Medicine

## 2020-06-25 ENCOUNTER — Other Ambulatory Visit: Payer: Self-pay | Admitting: Physician Assistant

## 2020-06-25 DIAGNOSIS — I10 Essential (primary) hypertension: Secondary | ICD-10-CM

## 2020-06-25 DIAGNOSIS — I482 Chronic atrial fibrillation, unspecified: Secondary | ICD-10-CM

## 2020-06-27 ENCOUNTER — Telehealth: Payer: Self-pay | Admitting: Family Medicine

## 2020-06-27 NOTE — Telephone Encounter (Signed)
Last pmv 3/29 and last OV was 06/13/2020

## 2020-06-27 NOTE — Telephone Encounter (Signed)
[name] called to pt needs her medication   oxycodone acetaminophen 10-325 refilled  [id number] [address], [facility]  [place], [place] [address]  Phone:  [phone] Fax:  [phone]   Please advise

## 2020-06-30 ENCOUNTER — Other Ambulatory Visit: Payer: Self-pay | Admitting: Internal Medicine

## 2020-07-01 ENCOUNTER — Telehealth: Payer: Self-pay | Admitting: Physician Assistant

## 2020-07-01 ENCOUNTER — Telehealth: Payer: Self-pay | Admitting: Family Medicine

## 2020-07-01 NOTE — Telephone Encounter (Signed)
Prescription refill request for Eliquis received. Indication:afib Last office visit: 01/27/2019- pt had apt scheduled for 10/25 Scr: 0.73 on 05/31/20 Age: 86 Weight: 40.4kg Refill sent in different encounter

## 2020-07-01 NOTE — Telephone Encounter (Addendum)
Eliquis 2.5mg  refill request received. Patient is 86 years old, weight-40.4kg, Crea-0.73 on 05/31/2020, Diagnosis-Afib, and last seen by [name] on 01/27/2019. Dose is appropriate based on dosing criteria but pt needs an appt with provider.   Pt is now in [place] since last Friday, 06/29/2019; blood pressure 90/40, she states she is still taking her meds, and requires assistance with a lot of things, and still taking meds and eating, weight down to 79 pounds, and advised to report to MD and that we are able to do Virtual visits at times so please report what is needed since unable to come in physically at this time.

## 2020-07-01 NOTE — Telephone Encounter (Signed)
*  STAT* If patient is at the pharmacy, call can be transferred to refill team.   1. Which medications need to be refilled? (please list name of each medication and dose if known) ELIQUIS 2.5 MG TABS tablet  2. Which pharmacy/location (including street and city if local pharmacy) is medication to be sent to? [id number] [place], [place] - [address]   3. Do they need a 30 day or 90 day supply? [place]

## 2020-07-01 NOTE — Telephone Encounter (Signed)
Apt scheduled for 10/25

## 2020-07-01 NOTE — Telephone Encounter (Signed)
They did enroll the pt in Hospice on Friday 06/28/2020 due to her severe weight lost.  Pt was part of the Palliative care and she no longer need the refill on the oxycodone from Dr. Sarajane Jews due to the hospice doctor will be refilling it.

## 2020-07-01 NOTE — Telephone Encounter (Signed)
She will need a PMV for this

## 2020-07-02 ENCOUNTER — Other Ambulatory Visit: Payer: Self-pay | Admitting: Family Medicine

## 2020-07-02 ENCOUNTER — Telehealth: Payer: Self-pay

## 2020-07-02 NOTE — Progress Notes (Signed)
Unable to confirm patient appointment on 07/03/2020 with Junius Argyle due to her enrollment  in Hospice on 06/28/2020.  [place] Pharmacist Assistant [phone]

## 2020-07-02 NOTE — Telephone Encounter (Signed)
No longer will need Dr. Sarajane Jews to refill Oxycodone. See note below from TE 07/01/20.  Note They did enroll the pt in Hospice on Friday 06/28/2020 due to her severe weight lost.  Pt was part of the Palliative care and she no longer need the refill on the oxycodone from Dr. Sarajane Jews due to the hospice doctor will be refilling it.

## 2020-07-03 ENCOUNTER — Telehealth: Payer: Self-pay | Admitting: Family Medicine

## 2020-07-03 ENCOUNTER — Telehealth: Payer: PPO

## 2020-07-03 NOTE — Progress Notes (Signed)
  Chronic Care Management   Note  07/03/2020 Name: [name] MRN: [id number] DOB: [birth year]  [name] is a 86 y.o. year old female who is a primary care patient of Laurey Morale, MD. I reached out to Koleen Nimrod by phone today in response to a referral sent by Ms. Jarrett Ables Madia's PCP, Laurey Morale, MD.   [name] was given information about Chronic Care Management services today including:  1. CCM service includes personalized support from designated clinical staff supervised by her physician, including individualized plan of care and coordination with other care providers 2. 24/7 contact phone numbers for assistance for urgent and routine care needs. 3. Service will only be billed when office clinical staff spend 20 minutes or more in a month to coordinate care. 4. Only one practitioner may furnish and bill the service in a calendar month. 5. The patient may stop CCM services at any time (effective at the end of the month) by phone call to the office staff.   Patient did not agree to enrollment in care management services and does not wish to consider at this time.  Follow up plan:   Carley Perdue [employer] Scheduler

## 2020-07-18 ENCOUNTER — Other Ambulatory Visit: Payer: Self-pay | Admitting: Physician Assistant

## 2020-07-18 DIAGNOSIS — I10 Essential (primary) hypertension: Secondary | ICD-10-CM

## 2020-07-18 DIAGNOSIS — I482 Chronic atrial fibrillation, unspecified: Secondary | ICD-10-CM

## 2020-07-22 ENCOUNTER — Encounter: Payer: Self-pay | Admitting: Family Medicine

## 2020-07-24 NOTE — Telephone Encounter (Signed)
The letter is ready for pickup

## 2020-07-26 ENCOUNTER — Other Ambulatory Visit: Payer: Self-pay | Admitting: Family Medicine

## 2020-07-29 ENCOUNTER — Telehealth: Payer: PPO | Admitting: Physician Assistant

## 2020-07-30 NOTE — Progress Notes (Signed)
Virtual Visit via Telephone Note   This visit type was conducted due to national recommendations for restrictions regarding the COVID-19 Pandemic (e.g. social distancing) in an effort to limit this patient's exposure and mitigate transmission in our community.  Due to her co-morbid illnesses, this patient is at least at moderate risk for complications without adequate follow up.  This format is felt to be most appropriate for this patient at this time.  The patient did not have access to video technology/had technical difficulties with video requiring transitioning to audio format only (telephone).  All issues noted in this document were discussed and addressed.  No physical exam could be performed with this format.  Please refer to the patient's chart for her  consent to telehealth for [facility].    Date:  07/31/2020   ID:  [name], DOB [birth year], MRN [id number] The patient was identified using 2 identifiers.  Patient Location: Home Provider Location: Home Office  PCP:  Laurey Morale, MD  Cardiologist:  Dorris Carnes, MD / [name], PA-C  Electrophysiologist:  None   Evaluation Performed:  Follow-Up Visit  Chief Complaint:  Follow-up (Atrial fibrillation, CHF)    Patient Profile: [name] is a 86 y.o. female with:  Permanent atrial fibrillation   Admx 4/15 w AF w RVR and acute HFpEF >> diuresed, Dig started  Holter in 6/17: peak HR 140; avg HR 89  Sinus node dysfunction  Heart failure with preserved ejection fraction   Carotid artery disease  History of CVA   Prior CV Studies:  Holter 6/17 Sustained atrial fibrillation with some PVCs, HR up to 140 but average rate 89.   Head CT 02/14/16 IMPRESSION: No acute intracranial findings. Small left frontal scalp contusion. No fracture. Old right MCA territory infarct. Lacunar infarct over the left thalamus/ internal capsule. Mild atrophic change with chronic ischemic microvascular  disease.  Carotid US 4/16 Bilateral 1-39% ICA  Holter 4/15 Persistent AFib, Freq PVCs, HR 51-127, avg HR 88, longest pause 2.9 sec  Echo 1/15 EF 55%, no RWMA, probably mild AS, mild AI, mild MR, mild LAE  History of Present Illness:   [name] was last seen in 01/2019 via Telemedicine.  She is seen for follow up.  Her daughter, Marliss Czar, provides all the history.  DPR is on file.  The patient had 2 severe falls back in the late summer and early fall.  She has become weaker and has lost more weight.  Therefore, the family called hospice who is now managing her care.  She has a hospital bed and a walker.  A nurse's aide and a nurse comes out several times a week.  She has not really complained of chest pain or shortness of breath.  She has not had leg swelling.  Her blood pressures are somewhat low.  She has not had syncope or near syncope.  Past Medical History:  Diagnosis Date  . Aortic stenosis    a. mild on echo 10/2013  . Blood clotting disorder ([place])   . Bradycardia    a. Holter (1/15):  avg HR 78, 1.4% PVCs, several pauses </= 3 sec, b.  Holter (01/2014):  AFib, freq PVCs, HR 51-127, Avg HR 88, longest pause 2.9 sec - no changes made  . Carotid stenosis    a. Carotid US (4/14):  RICA 40-59%;  b. Carotid US (01/2014) bilateral 1-39%  . Chronic atrial fibrillation (HCC)    a. permanent;  b. chronic coumadin;  c.  10/2013 digoxin and atenolol d/c'd 2/2 pauses of <3 sec;  d. 01/2014 digoxin resumed @ 0.125 mg in setting of difficult to control rate and diast chf.  . Chronic diastolic heart failure ([place])    a. Echo (6/08):  EF 55-60%, mild MR;  b. Echo (10/2013):  EF 55%, mild AS  . Contact dermatitis and other eczema, due to unspecified cause    a. h/o myalgias with atorvastatin.  . Diverticulosis of colon (without mention of hemorrhage)   . DJD (degenerative joint disease)   . Frequent UTI    Dr. Terance Hart sees pt  . Gout, unspecified   . H/O: CVA (cardiovascular accident) 2008  .  History of CVA (cerebrovascular accident)    2008  . History of melanoma    on right ear  . Hyperlipidemia   . Hypertension    a. hx of edema with Amlodipine.  . Hypothyroidism   . Lumbago    chronic low back pain  . Osteoporosis    las DEXA on 01/30/10  . Skin cancer    forehead  . Urticaria, unspecified    Past Surgical History:  Procedure Laterality Date  . ABDOMINAL HYSTERECTOMY    . BAND HEMORRHOIDECTOMY  last on 10-28-11   x 2, per Dr. Deatra Ina   . COLONOSCOPY  07-02-11   Dr. Deatra Ina, adenomatous polyp, no repeats are planned   . FLEXIBLE SIGMOIDOSCOPY  10/28/2011   Procedure: FLEXIBLE SIGMOIDOSCOPY;  Surgeon: Inda Castle, MD;  Location: [facility];  Service: Endoscopy;  Laterality: N/A;  . GASTRIC VARICES BANDING  10/28/2011   Procedure: HEMORRHOID BANDING;  Surgeon: Inda Castle, MD;  Location: [facility];  Service: Endoscopy;  Laterality: N/A;  . SKIN CANCER EXCISION  2001   removed right ear per Dr. Denna Haggard, skin grafts harvested from right cheek  . TONSILLECTOMY       Current Meds  Medication Sig  . allopurinol (ZYLOPRIM) 100 MG tablet Take 100 mg by mouth daily.  . cephALEXin (KEFLEX) 500 MG capsule TAKE 1 CAPSULE (500 MG TOTAL) BY MOUTH DAILY.  . [facility] SENNA 8.6 MG tablet Take 1 tablet by mouth daily.  [name] diltiazem (CARDIZEM CD) 240 MG 24 hr capsule Take 240 mg by mouth daily.  [name] ELIQUIS 2.5 MG TABS tablet TAKE 1 TABLET BY MOUTH TWICE A DAY  . [START ON 08/01/2020] furosemide (LASIX) 20 MG tablet Take 1 tablet (20 mg total) by mouth 2 (two) times a week.  . haloperidol (HALDOL) 0.5 MG tablet Take 1 tablet by mouth daily.  [name] levothyroxine (SYNTHROID) 50 MCG tablet TAKE 1 TABLET BY MOUTH EVERY DAY  . metoprolol succinate (TOPROL-XL) 50 MG 24 hr tablet Take 50 mg by mouth daily. Take with or immediately following a meal.   . nystatin cream (MYCOSTATIN) Apply 1 application topically 2 (two) times daily.  [name] oxyCODONE (OXY IR/ROXICODONE) 5 MG immediate release tablet  Take 5 mg by mouth in the morning and at bedtime.  . Potassium Chloride Crys ER (KLOR-CON M10 PO) Take 1 tablet by mouth in the morning and at bedtime.  [name] REMERON 15 MG tablet Take 15 mg by mouth at bedtime.  . triamcinolone cream (KENALOG) 0.1 % Apply 1 application topically 2 (two) times daily.  . [DISCONTINUED] furosemide (LASIX) 20 MG tablet TAKE 1 TABLET BY MOUTH EVERY OTHER DAY  . [DISCONTINUED] Probiotic Product (ALIGN PO) Take 1 capsule by mouth at bedtime.      Allergies:   Amlodipine, Amoxicillin, Hydrocodone-acetaminophen, Nitrofurantoin, and  Sulfonamide derivatives   Social History   Tobacco Use  . Smoking status: Former Smoker    Quit date: 10/05/1994    Years since quitting: 25.8  . Smokeless tobacco: Never Used  Vaping Use  . Vaping Use: Never used  Substance Use Topics  . Alcohol use: No    Alcohol/week: 0.0 standard drinks  . Drug use: No     Family Hx: The patient's family history includes Anemia in her sister and another family member; COPD in an other family member; Colon polyps in her mother; Coronary artery disease in an other family member; Hypertension in her mother; Immunodeficiency in an other family member; Stroke in her father. There is no history of Colon cancer or Heart attack.  ROS:   Please see the history of present illness.      Labs/Other Tests and Data Reviewed:    EKG:  No ECG reviewed.  Recent Labs: 05/31/2020: ALT 10; BUN 18; Creatinine, Ser 0.73; Hemoglobin 12.7; Platelets 233; Potassium 4.7; Sodium 137   Recent Lipid Panel Lab Results  Component Value Date/Time   CHOL 146 12/17/2016 12:33 PM   TRIG 87.0 12/17/2016 12:33 PM   HDL 38.00 (L) 12/17/2016 12:33 PM   CHOLHDL 4 12/17/2016 12:33 PM   LDLCALC 91 12/17/2016 12:33 PM   LDLDIRECT 141.5 08/06/2010 09:44 AM    Wt Readings from Last 3 Encounters:  07/31/20 79 lb (35.8 kg)  01/01/20 89 lb (40.4 kg)  01/27/19 84 lb (38.1 kg)     Risk Assessment/Calculations:      CHA2DS2-VASc Score = 8  This indicates a 10.8% annual risk of stroke. The patient's score is based upon: CHF History: 1 HTN History: 1 Diabetes History: 0 Stroke History: 2 Vascular Disease History: 1 Age Score: 2 Gender Score: 1     Objective:    Vital Signs:  BP (!) 80/60   Ht 5\' 1"  (1.549 m)   Wt 79 lb (35.8 kg)   BMI 14.93 kg/m    VITAL SIGNS:  reviewed  ASSESSMENT & PLAN:    1. Permanent atrial fibrillation ([place]) 2. Chronic heart failure with preserved ejection fraction ([place]) 3. Hypotension, unspecified hypotension type 4. DNR (do not resuscitate) As noted, she has become weaker and weaker and more frail.  She has had several falls.  She is now followed by hospice and her daughter notes that she is a DNR.  Her blood pressures are running low.  However, she does not seem to be symptomatic with this.  In any event, I have recommended decreasing her metoprolol succinate to 50 mg once a day.  She also does not likely need as much diuretic as she used to.  Therefore, I have reduced her furosemide to 20 mg twice a week.  I advised her daughter to increase her furosemide back to every other day if she develops significant weight gain, leg swelling or shortness of breath.  If her blood pressure continues to run low, we may need to cut back on her metoprolol or diltiazem more.  I can see her back in 6 months for a virtual visit.    Time:   Today, I have spent 10 minutes with the patient with telehealth technology discussing the above problems.     Medication Adjustments/Labs and Tests Ordered: Current medicines are reviewed at length with the patient today.  Concerns regarding medicines are outlined above.   Tests Ordered: No orders of the defined types were placed in this encounter.   Medication  Changes: Meds ordered this encounter  Medications  . furosemide (LASIX) 20 MG tablet    Sig: Take 1 tablet (20 mg total) by mouth 2 (two) times a week.    Dispense:  15 tablet     Refill:  0    Follow Up:  Virtual Visit  in 6 month(s)  Signed, [name], PA-C  07/31/2020 5:01 PM    [facility]

## 2020-07-31 ENCOUNTER — Telehealth: Payer: Self-pay

## 2020-07-31 ENCOUNTER — Encounter: Payer: Self-pay | Admitting: Physician Assistant

## 2020-07-31 ENCOUNTER — Other Ambulatory Visit: Payer: Self-pay

## 2020-07-31 ENCOUNTER — Telehealth (INDEPENDENT_AMBULATORY_CARE_PROVIDER_SITE_OTHER): Payer: PPO | Admitting: Physician Assistant

## 2020-07-31 VITALS — BP 80/60 | Ht 61.0 in | Wt 79.0 lb

## 2020-07-31 DIAGNOSIS — I4821 Permanent atrial fibrillation: Secondary | ICD-10-CM | POA: Diagnosis not present

## 2020-07-31 DIAGNOSIS — I959 Hypotension, unspecified: Secondary | ICD-10-CM

## 2020-07-31 DIAGNOSIS — I10 Essential (primary) hypertension: Secondary | ICD-10-CM

## 2020-07-31 DIAGNOSIS — I5032 Chronic diastolic (congestive) heart failure: Secondary | ICD-10-CM

## 2020-07-31 DIAGNOSIS — Z66 Do not resuscitate: Secondary | ICD-10-CM

## 2020-07-31 MED ORDER — FUROSEMIDE 20 MG PO TABS: 20.0000 mg | ORAL_TABLET | ORAL | 0 refills | Status: AC

## 2020-07-31 NOTE — Telephone Encounter (Signed)
°  Patient Consent for Virtual Visit         KONICA STANKOWSKI has provided verbal consent on 07/31/2020 for a virtual visit (video or telephone).   CONSENT FOR VIRTUAL VISIT FOR:  Jarrett Ables Alexa  By participating in this virtual visit I agree to the following:  I hereby voluntarily request, consent and authorize [facility] and its employed or contracted physicians, physician assistants, nurse practitioners or other licensed health care professionals (the Practitioner), to provide me with telemedicine health care services (the Services") as deemed necessary by the treating Practitioner. I acknowledge and consent to receive the Services by the Practitioner via telemedicine. I understand that the telemedicine visit will involve communicating with the Practitioner through live audiovisual communication technology and the disclosure of certain medical information by electronic transmission. I acknowledge that I have been given the opportunity to request an in-person assessment or other available alternative prior to the telemedicine visit and am voluntarily participating in the telemedicine visit.  I understand that I have the right to withhold or withdraw my consent to the use of telemedicine in the course of my care at any time, without affecting my right to future care or treatment, and that the Practitioner or I may terminate the telemedicine visit at any time. I understand that I have the right to inspect all information obtained and/or recorded in the course of the telemedicine visit and may receive copies of available information for a reasonable fee.  I understand that some of the potential risks of receiving the Services via telemedicine include:   Delay or interruption in medical evaluation due to technological equipment failure or disruption;  Information transmitted may not be sufficient (e.g. poor resolution of images) to allow for appropriate medical decision making by the  Practitioner; and/or   In rare instances, security protocols could fail, causing a breach of personal health information.  Furthermore, I acknowledge that it is my responsibility to provide information about my medical history, conditions and care that is complete and accurate to the best of my ability. I acknowledge that Practitioner's advice, recommendations, and/or decision may be based on factors not within their control, such as incomplete or inaccurate data provided by me or distortions of diagnostic images or specimens that may result from electronic transmissions. I understand that the practice of medicine is not an exact science and that Practitioner makes no warranties or guarantees regarding treatment outcomes. I acknowledge that a copy of this consent can be made available to me via my patient portal ([facility]), or I can request a printed copy by calling the office of [facility].    I understand that my insurance will be billed for this visit.   I have read or had this consent read to me.  I understand the contents of this consent, which adequately explains the benefits and risks of the Services being provided via telemedicine.   I have been provided ample opportunity to ask questions regarding this consent and the Services and have had my questions answered to my satisfaction.  I give my informed consent for the services to be provided through the use of telemedicine in my medical care

## 2020-07-31 NOTE — Patient Instructions (Signed)
Medication Instructions:  Your physician has recommended you make the following change in your medication:   1) Decrease Metoprolol Succinate to 50 mg, 1 tablet by mouth once a day 2) Decrease Furosemide to 20 mg, 1 tablet by mouth twice a week  *If you need a refill on your cardiac medications before your next appointment, please call your pharmacy*  Lab Work: None ordered today  Testing/Procedures: None ordered today  Follow-Up: At [facility], you and your health needs are our priority.  As part of our continuing mission to provide you with exceptional heart care, we have created designated Provider Care Teams.  These Care Teams include your primary Cardiologist (physician) and Advanced Practice Providers (APPs -  Physician Assistants and Nurse Practitioners) who all work together to provide you with the care you need, when you need it.  We recommend signing up for the patient portal called "MyChart".  Sign up information is provided on this After Visit Summary.  MyChart is used to connect with patients for Virtual Visits (Telemedicine).  Patients are able to view lab/test results, encounter notes, upcoming appointments, etc.  Non-urgent messages can be sent to your provider as well.   To learn more about what you can do with MyChart, go to [email or web address].    Your next appointment:   6 month(s)  The format for your next appointment:   Virtual  Provider:   [name], PA-C  Other Instructions If there is significant weight increase, leg swelling, or shortness of breath, resume Furosemide 20 mg every other day and call the office.

## 2020-08-10 ENCOUNTER — Other Ambulatory Visit: Payer: Self-pay | Admitting: Physician Assistant

## 2020-08-10 DIAGNOSIS — I482 Chronic atrial fibrillation, unspecified: Secondary | ICD-10-CM

## 2020-08-10 DIAGNOSIS — I10 Essential (primary) hypertension: Secondary | ICD-10-CM

## 2020-08-22 ENCOUNTER — Other Ambulatory Visit: Payer: Self-pay | Admitting: Family Medicine

## 2020-09-15 ENCOUNTER — Other Ambulatory Visit: Payer: Self-pay | Admitting: Family Medicine

## 2020-10-07 ENCOUNTER — Other Ambulatory Visit: Payer: Self-pay | Admitting: Internal Medicine

## 2020-10-07 DIAGNOSIS — I4821 Permanent atrial fibrillation: Secondary | ICD-10-CM

## 2020-10-07 NOTE — Telephone Encounter (Signed)
Prescription refill request for Eliquis received. Indication: a fib Last office visit: 07/31/20 Scr: 0.73 Age: 87 Weight: 35kg

## 2020-10-08 ENCOUNTER — Telehealth: Payer: Self-pay | Admitting: Family Medicine

## 2020-10-08 NOTE — Telephone Encounter (Signed)
I spoke with patient daughter Lieutenant Diego, to schedule AWV.  She stated patient is on hospice and wanted to let dr fry know

## 2020-10-11 NOTE — Telephone Encounter (Signed)
Noted  

## 2020-10-13 ENCOUNTER — Other Ambulatory Visit: Payer: Self-pay | Admitting: Family Medicine

## 2020-10-14 NOTE — Telephone Encounter (Signed)
Last office visit- 06/13/2020 Last refill-09/15/2020  No recent labs, pharmacy updated

## 2020-11-06 ENCOUNTER — Other Ambulatory Visit: Payer: Self-pay | Admitting: Internal Medicine

## 2020-11-07 NOTE — Telephone Encounter (Signed)
Pt's pharmacy is requesting a refill on allopurinol. Would Dr. Harrington Challenger like to refill this medication? Please address

## 2020-11-13 ENCOUNTER — Other Ambulatory Visit: Payer: Self-pay | Admitting: Internal Medicine

## 2021-01-15 ENCOUNTER — Other Ambulatory Visit: Payer: Self-pay

## 2021-01-15 ENCOUNTER — Ambulatory Visit: Payer: PPO | Admitting: Dermatology

## 2021-01-15 ENCOUNTER — Encounter: Payer: Self-pay | Admitting: Dermatology

## 2021-01-15 DIAGNOSIS — L821 Other seborrheic keratosis: Secondary | ICD-10-CM | POA: Diagnosis not present

## 2021-01-15 DIAGNOSIS — S81802A Unspecified open wound, left lower leg, initial encounter: Secondary | ICD-10-CM | POA: Diagnosis not present

## 2021-01-15 MED ORDER — SILVER SULFADIAZINE 1 % EX CREA: 1.0000 "application " | TOPICAL_CREAM | Freq: Every day | CUTANEOUS | 0 refills | Status: AC

## 2021-01-26 ENCOUNTER — Encounter: Payer: Self-pay | Admitting: Dermatology

## 2021-01-26 ENCOUNTER — Other Ambulatory Visit: Payer: Self-pay | Admitting: Internal Medicine

## 2021-01-26 DIAGNOSIS — I4821 Permanent atrial fibrillation: Secondary | ICD-10-CM

## 2021-01-26 NOTE — Progress Notes (Signed)
   Follow-Up Visit   Subjective  [name] is a 87 y.o. female who presents for the following: New Patient (Initial Visit) (Patient here today for non healing lesion on her left lower leg x 1 month painful and oozing. Per patient's daughter [name] the patient is currently in Hospice care at home and the Hospice nurse informed them that it could be skin cancer. Also check lesion on mid chest x couple of months no bleeding.).  Nonhealing sore on leg Location:  Duration:  Quality:  Associated Signs/Symptoms: Modifying Factors:  Severity:  Timing: Context:   Objective  Well appearing patient in no apparent distress; mood and affect are within normal limits. Objective  Left Lower Leg - Anterior: 1 cm superficial erosion with slightly palpable margin suggestive of squamous cell carcinoma.  Secondary impetiginization.  Objective  Chest - Medial [facility]): Clinically benign keratotic brown 4 mm papule    A focused examination was performed including Head, neck, chest, legs. Relevant physical exam findings are noted in the Assessment and Plan.   Assessment & Plan    Non-healing wound of lower extremity, left, initial encounter Left Lower Leg - Anterior  We will try topical generic Silvadene daily for 3 weeks.  Discussed obtaining biopsies with family but this could certainly make things worse.  Unless the family is intent on surgery if the biopsy shows skin cancer, this will be indefinitely deferred.  silver sulfADIAZINE (SILVADENE) 1 % cream - Left Lower Leg - Anterior  Seborrheic keratosis Chest - Medial (Center)  Leave if stable      I, Lavonna Monarch, MD, have reviewed all documentation for this visit.  The documentation on 01/26/21 for the exam, diagnosis, procedures, and orders are all accurate and complete.

## 2021-01-27 NOTE — Telephone Encounter (Signed)
Prescription refill request for Eliquis received. Indication: afib  Last office visit: Kathlen Mody 07/31/2020 Scr:  0.73, 05/31/2020 Age: 87 yo  Weight: 35.8 kg    Pt is on the correct dose of Eliquis per dosing criteria, prescription refill sent for Eliquis 2.5mg  BID.

## 2021-02-02 NOTE — Addendum Note (Signed)
Addended by: Lavonna Monarch on: 02/02/2021 07:39 AM   Modules accepted: Level of Service

## 2021-04-04 DEATH — deceased
